# Patient Record
Sex: Female | Born: 1995 | Race: Black or African American | Hispanic: No | Smoking: Never smoker
Health system: Southern US, Community
[De-identification: ages and names within clinical notes are randomized; demographics above are authoritative.]

## PROBLEM LIST (undated history)

## (undated) ENCOUNTER — Inpatient Hospital Stay (HOSPITAL_COMMUNITY): Payer: Self-pay

## (undated) DIAGNOSIS — Z9889 Other specified postprocedural states: Secondary | ICD-10-CM

## (undated) DIAGNOSIS — O3430 Maternal care for cervical incompetence, unspecified trimester: Secondary | ICD-10-CM

---
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
## 2013-01-11 NOTE — L&D Delivery Note (Signed)
Delivery Note  Patient is 18 y.o. 301P1 3592w1d with no prenatal care in SOL with bulging bag in vagina, SROM'd when transferred to birthing suites and FHT found to be 55 on sono which was consistent with LMP.   At  a non-viable unspecified sex was delivered via  (Presentation: breech ).  APGAR: 0,0  Placenta status: pending .  Anesthesia:  none Episiotomy: none Lacerations: none Suture Repair: n/a Est. Blood Loss (mL): 150mL as of 7:48 PM  Placenta pending, 1000mcg of cytotec given orally, pitocin started.  Margaret Keller to postpartum.  [facility].  Margaret Keller 10/23/2013, 7:47 PM   Placenta delivered at 2023, total EBL 200cc, placenta sent to path.  Patient currently very comfortable.  Perry MountACOSTA,Margaret Yilmaz ROCIO, MD 2:33 AM

## 2013-10-23 ENCOUNTER — Inpatient Hospital Stay (HOSPITAL_COMMUNITY): Payer: Medicaid Other

## 2013-10-23 ENCOUNTER — Encounter (HOSPITAL_COMMUNITY): Payer: Self-pay | Admitting: *Deleted

## 2013-10-23 ENCOUNTER — Inpatient Hospital Stay (HOSPITAL_COMMUNITY)
Admission: AD | Admit: 2013-10-23 | Discharge: 2013-10-24 | DRG: 775 | Disposition: A | Discharge status: Home / Self Care | Payer: Medicaid Other | Source: Ambulatory Visit | Attending: Family Medicine | Admitting: Family Medicine

## 2013-10-23 DIAGNOSIS — O42912 Preterm premature rupture of membranes, unspecified as to length of time between rupture and onset of labor, second trimester: Principal | ICD-10-CM | POA: Diagnosis present

## 2013-10-23 DIAGNOSIS — Z3A20 20 weeks gestation of pregnancy: Secondary | ICD-10-CM | POA: Diagnosis present

## 2013-10-23 DIAGNOSIS — O479 False labor, unspecified: Secondary | ICD-10-CM | POA: Diagnosis present

## 2013-10-23 DIAGNOSIS — O0932 Supervision of pregnancy with insufficient antenatal care, second trimester: Secondary | ICD-10-CM

## 2013-10-23 DIAGNOSIS — O42919 Preterm premature rupture of membranes, unspecified as to length of time between rupture and onset of labor, unspecified trimester: Secondary | ICD-10-CM

## 2013-10-23 DIAGNOSIS — O321XX0 Maternal care for breech presentation, not applicable or unspecified: Secondary | ICD-10-CM | POA: Diagnosis present

## 2013-10-23 DIAGNOSIS — O4700 False labor before 37 completed weeks of gestation, unspecified trimester: Secondary | ICD-10-CM | POA: Diagnosis present

## 2013-10-23 LAB — DIFFERENTIAL
BASOS PCT: 0 % (ref 0–1)
Basophils Absolute: 0 10*3/uL (ref 0.0–0.1)
EOS PCT: 1 % (ref 0–5)
Eosinophils Absolute: 0.2 10*3/uL (ref 0.0–0.7)
LYMPHS ABS: 5.4 10*3/uL — AB (ref 0.7–4.0)
Lymphocytes Relative: 27 % (ref 12–46)
MONO ABS: 1.6 10*3/uL — AB (ref 0.1–1.0)
Monocytes Relative: 8 % (ref 3–12)
NEUTROS ABS: 12.7 10*3/uL — AB (ref 1.7–7.7)
Neutrophils Relative %: 64 % (ref 43–77)

## 2013-10-23 LAB — TYPE AND SCREEN
ABO/RH(D): O POS
Antibody Screen: NEGATIVE

## 2013-10-23 LAB — CBC
HEMATOCRIT: 35 % — AB (ref 36.0–46.0)
Hemoglobin: 12.4 g/dL (ref 12.0–15.0)
MCH: 29.9 pg (ref 26.0–34.0)
MCHC: 35.4 g/dL (ref 30.0–36.0)
MCV: 84.3 fL (ref 78.0–100.0)
Platelets: 143 10*3/uL — ABNORMAL LOW (ref 150–400)
RBC: 4.15 MIL/uL (ref 3.87–5.11)
RDW: 13.5 % (ref 11.5–15.5)
WBC: 19.9 10*3/uL — AB (ref 4.0–10.5)

## 2013-10-23 LAB — ABO/RH: ABO/RH(D): O POS

## 2013-10-23 LAB — RAPID HIV SCREEN (WH-MAU): Rapid HIV Screen: NONREACTIVE

## 2013-10-23 MED ORDER — WITCH HAZEL-GLYCERIN EX PADS: 1.0000 "application " | MEDICATED_PAD | CUTANEOUS | Status: DC | PRN

## 2013-10-23 MED ORDER — SENNOSIDES-DOCUSATE SODIUM 8.6-50 MG PO TABS: 2.0000 | ORAL_TABLET | ORAL | Status: DC

## 2013-10-23 MED ORDER — CITRIC ACID-SODIUM CITRATE 334-500 MG/5ML PO SOLN: 30.0000 mL | ORAL | Status: DC | PRN

## 2013-10-23 MED ORDER — OXYCODONE-ACETAMINOPHEN 5-325 MG PO TABS: 2.0000 | ORAL_TABLET | ORAL | Status: DC | PRN

## 2013-10-23 MED ORDER — LACTATED RINGERS IV SOLN
INTRAVENOUS | Status: DC
Start: 2013-10-23 — End: 2013-10-23

## 2013-10-23 MED ORDER — ONDANSETRON HCL 4 MG/2ML IJ SOLN: 4.0000 mg | INTRAMUSCULAR | Status: DC | PRN

## 2013-10-23 MED ORDER — PRENATAL MULTIVITAMIN CH: 1.0000 | ORAL_TABLET | Freq: Every day | ORAL | Status: DC

## 2013-10-23 MED ORDER — ACETAMINOPHEN 325 MG PO TABS: 650.0000 mg | ORAL_TABLET | ORAL | Status: DC | PRN

## 2013-10-23 MED ORDER — LANOLIN HYDROUS EX OINT: TOPICAL_OINTMENT | CUTANEOUS | Status: DC | PRN

## 2013-10-23 MED ORDER — SIMETHICONE 80 MG PO CHEW: 80.0000 mg | CHEWABLE_TABLET | ORAL | Status: DC | PRN

## 2013-10-23 MED ORDER — OXYCODONE-ACETAMINOPHEN 5-325 MG PO TABS: 1.0000 | ORAL_TABLET | ORAL | Status: DC | PRN

## 2013-10-23 MED ORDER — ZOLPIDEM TARTRATE 5 MG PO TABS: 5.0000 mg | ORAL_TABLET | Freq: Every evening | ORAL | Status: DC | PRN

## 2013-10-23 MED ORDER — TETANUS-DIPHTH-ACELL PERTUSSIS 5-2.5-18.5 LF-MCG/0.5 IM SUSP: 0.5000 mL | Freq: Once | INTRAMUSCULAR | Status: DC

## 2013-10-23 MED ORDER — OXYTOCIN BOLUS FROM INFUSION
500.0000 mL | INTRAVENOUS | Status: DC
  Administered 2013-10-23: 500 mL via INTRAVENOUS

## 2013-10-23 MED ORDER — CALCIUM CARBONATE ANTACID 500 MG PO CHEW: 2.0000 | CHEWABLE_TABLET | ORAL | Status: DC | PRN

## 2013-10-23 MED ORDER — INFLUENZA VAC SPLIT QUAD 0.5 ML IM SUSY
0.5000 mL | PREFILLED_SYRINGE | INTRAMUSCULAR | Status: AC
  Administered 2013-10-24: 0.5 mL via INTRAMUSCULAR

## 2013-10-23 MED ORDER — FLEET ENEMA 7-19 GM/118ML RE ENEM: 1.0000 | ENEMA | RECTAL | Status: DC | PRN

## 2013-10-23 MED ORDER — BENZOCAINE-MENTHOL 20-0.5 % EX AERO: 1.0000 "application " | INHALATION_SPRAY | CUTANEOUS | Status: DC | PRN

## 2013-10-23 MED ORDER — DIBUCAINE 1 % RE OINT: 1.0000 "application " | TOPICAL_OINTMENT | RECTAL | Status: DC | PRN

## 2013-10-23 MED ORDER — LIDOCAINE HCL (PF) 1 % IJ SOLN
30.0000 mL | INTRAMUSCULAR | Status: DC | PRN
  Filled 2013-10-23: qty 30

## 2013-10-23 MED ORDER — DOCUSATE SODIUM 100 MG PO CAPS: 100.0000 mg | ORAL_CAPSULE | Freq: Every day | ORAL | Status: DC

## 2013-10-23 MED ORDER — ONDANSETRON HCL 4 MG/2ML IJ SOLN: 4.0000 mg | Freq: Four times a day (QID) | INTRAMUSCULAR | Status: DC | PRN

## 2013-10-23 MED ORDER — MISOPROSTOL 200 MCG PO TABS
1000.0000 ug | ORAL_TABLET | Freq: Once | ORAL | Status: AC
  Administered 2013-10-23: 1000 ug via ORAL
  Filled 2013-10-23: qty 5

## 2013-10-23 MED ORDER — OXYTOCIN 40 UNITS IN LACTATED RINGERS INFUSION - SIMPLE MED
62.5000 mL/h | INTRAVENOUS | Status: DC
  Filled 2013-10-23: qty 1000

## 2013-10-23 MED ORDER — DIPHENHYDRAMINE HCL 25 MG PO CAPS: 25.0000 mg | ORAL_CAPSULE | Freq: Four times a day (QID) | ORAL | Status: DC | PRN

## 2013-10-23 MED ORDER — ONDANSETRON HCL 4 MG PO TABS
4.0000 mg | ORAL_TABLET | ORAL | Status: DC | PRN
Start: 2013-10-23 — End: 2013-10-24

## 2013-10-23 MED ORDER — LACTATED RINGERS IV SOLN: 500.0000 mL | INTRAVENOUS | Status: DC | PRN

## 2013-10-23 MED ORDER — FENTANYL CITRATE 0.05 MG/ML IJ SOLN
100.0000 ug | INTRAMUSCULAR | Status: DC | PRN
  Administered 2013-10-23: 100 ug via INTRAVENOUS
  Filled 2013-10-23: qty 2

## 2013-10-23 MED ORDER — IBUPROFEN 600 MG PO TABS
600.0000 mg | ORAL_TABLET | Freq: Four times a day (QID) | ORAL | Status: DC
  Administered 2013-10-23 – 2013-10-24 (×2): 600 mg via ORAL
  Filled 2013-10-23 (×2): qty 1

## 2013-10-23 NOTE — MAU Note (Signed)
Pt C/O lower abd pain that started yesterday, became severe today.  Pt noted slight amount of bleeding with wiping on arrival to MAU.  No PNC.

## 2013-10-23 NOTE — MAU Note (Signed)
Report called to Earle GellBobby Gurkirat [facility].

## 2013-10-23 NOTE — H&P (Signed)
FACULTY PRACTICE ANTEPARTUM ADMISSION HISTORY AND PHYSICAL NOTE   History of Present Illness: Margaret Keller is a 18 y.o. G1P0 at 1943w1d admitted for Preterm labor. Patient reports the fetal movement as active. Patient reports uterine contraction  activity as regular, every 2-3 minutes. Patient reports  vaginal bleeding as none. Patient describes fluid per vagina as None. Fetal presentation is unsure.  Interview limited by patient's severe distress, reporting "I have to push, I have to push."  She also reports having taken a tylenol-PM today for pain.  Thus intermittently patient appears lethargic.   When arrived to L&D patient reported she had to push and I witnessed clear nonfoul smelling rupture of membranes.  On initial spec exam membranes in vault and intact.  Patient Active Problem List   Diagnosis Date Noted  . Preterm labor 10/23/2013     Past Medical History  Diagnosis Date  . Medical history non-contributory      Past Surgical History  Procedure Laterality Date  . No past surgeries       OB History   Grav Para Term Preterm Abortions TAB SAB Ect Mult Living   1               History   Social History  . Marital Status: Single    Spouse Name: N/A    Number of Children: N/A  . Years of Education: N/A   Social History Main Topics  . Smoking status: Never Smoker   . Smokeless tobacco: None  . Alcohol Use: No  . Drug Use: No  . Sexual Activity: Yes   Other Topics Concern  . None   Social History Narrative  . None    History reviewed. No pertinent family history.  Allergies not on file  No prescriptions prior to admission    . docusate sodium  100 mg Oral Daily  . [START ON 10/24/2013] prenatal multivitamin  1 tablet Oral Q1200   I have reviewed the patient's current medications.  Review of Systems - General ROS: negative ROS severely limited secondary to patient's severe distress, appears drowsy  Vitals:  BP 123/66  Pulse 92  Temp(Src)  97.9 F (36.6 C) (Oral)  Resp 22  LMP 06/04/2013 Physical Examination:  General appearance - intermittently lethargic, in severe distress Abdomen - soft, nontender, nondistended, no masses or organomegaly Pelvic - normal external genitalia, vulva, vagina, cervix, uterus and adnexa Extremities - peripheral pulses normal, no pedal edema, no clubbing or cyanosis Skin - normal coloration and turgor, no rashes, no suspicious skin lesions noted Abdomen: gravid Pelvic Exam:normal external genitalia, vulva, vagina, cervix, uterus and adnexa, bulging membranes easily visualized in vagina, exam limited due to severe distress of patient  Cervix: Not evaluated. Extremities: extremities normal, atraumatic, no cyanosis or edema  Membranes:intact Fetal Monitoring TOCO: q1-2 Labs:  No results found for this or any previous visit (from the past 24 hour(s)).  Imaging Studies: No results found.   Assessment and Plan: Patient Active Problem List   Diagnosis Date Noted  . Preterm labor 10/23/2013    Dating ultrasound STAT=> c/w LMP 5443w1d, FHT on sono 55 Patient reporting that she is very sure of her dating, discussed that fetus is previable based on LMP==> as such no betamethasone, mag given.   No tocolysis secondary to rupture of membranes.  Prenatal labs including CBC, type and screen UDS G/C  Repeat FHT in 2-3 hours  Perry MountACOSTA,Socrates Cahoon ROCIO, MD OB fellow Faculty Practice, [facility]

## 2013-10-24 LAB — RPR

## 2013-10-24 LAB — RUBELLA SCREEN: RUBELLA: 2.37 {index} — AB (ref <0.90)

## 2013-10-24 LAB — RAPID URINE DRUG SCREEN, HOSP PERFORMED
AMPHETAMINES: NOT DETECTED
Barbiturates: NOT DETECTED
Benzodiazepines: NOT DETECTED
COCAINE: NOT DETECTED
OPIATES: NOT DETECTED
TETRAHYDROCANNABINOL: NOT DETECTED

## 2013-10-24 LAB — HEPATITIS B SURFACE ANTIGEN: HEP B S AG: NEGATIVE

## 2013-10-24 MED ORDER — NORETHINDRONE 0.35 MG PO TABS: 1.0000 | ORAL_TABLET | Freq: Every day | ORAL | Status: DC

## 2013-10-24 NOTE — Discharge Summary (Signed)
Attestation of Attending Supervision of Advanced Practitioner (CNM/NP): Evaluation and management procedures were performed by the Advanced Practitioner under my supervision and collaboration.  I have reviewed the Advanced Practitioner's note and chart, and I agree with the management and plan.  Torryn Fiske 10/24/2013 8:46 AM   

## 2013-10-24 NOTE — Discharge Summary (Signed)
Obstetric Discharge Summary Reason for Admission: onset of labor Prenatal Procedures: ultrasound Intrapartum Procedures: spontaneous vaginal delivery Postpartum Procedures: none Complications-Operative and Postpartum: none  Delivery Note Patient is 18 y.o. 61P1 5175w1d with no prenatal care in SOL with bulging bag in vagina, SROM'd when transferred to birthing suites and FHT found to be 55 on sono which was consistent with LMP.  At a non-viable unspecified sex was delivered via (Presentation: breech ). APGAR: 0,0  Placenta status: pending .  Anesthesia: none  Episiotomy: none  Lacerations: none  Suture Repair: n/a  Est. Blood Loss (mL): 150mL as of 7:48 PM  Placenta pending, 1000mcg of cytotec given orally, pitocin started.  Mom to postpartum. [facility].  Margaret Keller  10/23/2013, 7:47 PM  Placenta delivered at 2023, total EBL 200cc, placenta sent to path. Patient currently very comfortable.      Hospital Course:  Active Problems:   Preterm labor   Preterm contractions   Preterm premature rupture of membranes (PPROM) with unknown onset of labor   Today: No acute events overnight.  Pt denies problems with ambulating, voiding or po intake.  She denies nausea or vomiting.  Pain is well controlled.  She has had flatus. She has had bowel movement.  Lochia Minimal.  Plan for birth control is  OCP.  Margaret Keller is a 18 y.o. G1P1 s/p SVD at 7175w1d after presenting to MAU in active labor with bulging membranes in vagina.  She did not receive any prenatal care, ultrasound consistent with LMP with FHT 55, ruptured spontaneously when transferred to birthing suites  H/H: Lab Results  Component Value Date/Time   HGB 12.4 10/23/2013  5:16 PM   HCT 35.0* 10/23/2013  5:16 PM    Discharge Diagnoses: Premature labor  Discharge Information: Date: 10/24/2013 Activity: pelvic rest Diet: routine  Medications: micronor Breast feeding:  No: n/a Condition:  stable Instructions: refer to handout Discharge to: home   Discharge Instructions   Call MD for:  severe uncontrolled pain    Complete by:  As directed      Call MD for:  temperature >100.4    Complete by:  As directed      Diet - low sodium heart healthy    Complete by:  As directed             Medication List         acetaminophen 500 MG tablet  Commonly known as:  TYLENOL  Take 1,000 mg by mouth every 6 (six) hours as needed for mild pain or headache.     norethindrone 0.35 MG tablet  Commonly known as:  ORTHO MICRONOR  Take 1 tablet (0.35 mg total) by mouth daily.           Follow-up Information   Follow up with [facility] In 5 weeks.   Contact information:   [address] [place] [address] [phone]       Schedule an appointment as soon as possible for a visit to follow up. (for postpartum follow up)       Perry MountACOSTA,Margaret Figge Keller ,MD OB Fellow 10/24/2013,7:53 AM

## 2013-10-24 NOTE — Progress Notes (Signed)
Discharge teaching complete. Pt understood all instructions and did not have any questions. Pt ambulated out of the hospital and discharged home to family.  

## 2013-10-24 NOTE — Discharge Instructions (Signed)
Vaginal Delivery, Care After °Refer to this sheet in the next few weeks. These instructions provide you with information on caring for yourself after your delivery. Your health care provider may also give you more specific instructions. Your treatment has been planned according to current medical practices, but problems sometimes occur. Call your health care provider if you have any problems or questions after your delivery. °WHAT TO EXPECT AFTER YOUR DELIVERY °After your delivery, it is typical to have the following:  °· You may feel pain in the vaginal area for several days after delivery. If you had an incision or a vaginal tear, the area will probably continue to be tender to the touch for several weeks. °· You may feel very fatigued after a vaginal delivery. °· You may have vaginal bleeding and discharge that will start out red, then become pink, then yellow, then white. Altogether, this usually lasts for about 6 weeks. °· The combination of having lost your baby and changing hormones from the delivery can make you feel very sad. You may also experience emotions that change very quickly. Some of the emotions people often notice after loss include: °¨ Anger. °¨ Denial. °¨ Guilt. °¨ Sorrow. °¨ Depression. °¨ Grief. °¨ Relationship problems. °HOME CARE INSTRUCTIONS  °· Consider seeking support for your loss. Some forms of support that you might consider include your religious leader, friends, family, a professional counselor, or a bereavement support group. °· Take medicines only as directed by your health care provider. °· Continue to use good perineal care. Good perineal care includes:   °¨ Wiping your perineum from front to back.   °¨ Keeping your perineum clean.   °· Do not use tampons or douche until your health care provider says it is okay.   °· Shower, wash your hair, and take tub baths as directed by your health care provider.   °· Wear a well-fitting bra that provides breast support.   °· Drink enough  fluids to keep your urine clear or pale yellow.   °· Eat healthy foods.   °· Eat high-fiber foods every day, such as whole grain cereals and breads, brown rice, beans, and fresh fruits and vegetables. These foods may help prevent or relieve constipation.   °· Follow your health care provider's directions about resuming activities such as climbing stairs, driving, lifting, exercising, or traveling.   °· Increase your activities gradually.   °· Talk to your health care provider about resuming sexual activities. This depends on your risk of infection, your rate of healing, and your comfort and desire to resume sexual activity.   °· Try to have someone help you with your household activities for at least a few days after you leave the hospital.   °· Rest as much as possible. °· Keep all of your scheduled postpartum appointments. It is very important to keep your scheduled follow-up appointments. At these appointments, your health care provider will be checking to make sure that you are healing physically and emotionally.   °· Do not drink alcohol, especially if you are taking medicine to relieve pain.   °· Do not use any tobacco products including cigarettes, chewing tobacco, or electronic cigarettes. If you need help quitting, ask your health care provider. °· Do not use illegal drugs.   °SEEK MEDICAL CARE IF:  °· You feel sad or depressed.   °· You have thoughts of hurting yourself. °· You are having trouble eating or sleeping. °· You cannot enjoy the things in life you have previously enjoyed. °· You are passing large clots from your vagina. Save any clots to   show your health care provider.   °· You have a bad smelling discharge from your vagina.   °· You have trouble urinating.   °· You are urinating frequently.   °· You have pain when you urinate.   °· You have a change in your bowel movements.   °· You have increasing redness, pain, or swelling near your incision or vaginal tear.   °· You have pus draining from  your incision or vaginal tear.   °· Your incision or vaginal tear is separating.   °· You have painful, hard, or reddened breasts.   °· You have a severe headache.   °· You have blurred vision or see spots.   °· You are dizzy or light-headed.   °· You have a rash.   °· You have nausea or vomiting.   °· You have not had a menstrual period by the 12th week after delivery.   °· You have a fever.   °SEEK IMMEDIATE MEDICAL CARE IF:  °· You are concerned that you may hurt yourself or you are considering suicide.   °· You have persistent pain.   °· You have chest pain.   °· You have shortness of breath.   °· You faint.   °· You have leg pain.   °· You have stomach pain.   °· Your vaginal bleeding saturates two or more sanitary pads in 1 hour.   °MAKE SURE YOU: °· Understand these instructions.   °· Will watch your condition.   °· Will get help right away if you are not doing well or get worse.   °Document Released: 05/14/2013 Document Reviewed: 01/02/2013 °ExitCare® Patient Information ©2015 [employer]. This information is not intended to replace advice given to you by your health care provider. Make sure you discuss any questions you have with your health care provider. ° °

## 2013-10-24 NOTE — H&P (Signed)
Attestation of Attending Supervision of Advanced Practitioner (CNM/NP): Evaluation and management procedures were performed by the Advanced Practitioner under my supervision and collaboration.  I have reviewed the Advanced Practitioner's note and chart, and I agree with the management and plan.  Makylie Rivere 10/24/2013 1:37 AM

## 2013-10-25 LAB — GC/CHLAMYDIA PROBE AMP
CT Probe RNA: NEGATIVE
GC Probe RNA: NEGATIVE

## 2013-11-12 ENCOUNTER — Encounter (HOSPITAL_COMMUNITY): Payer: Self-pay | Admitting: *Deleted

## 2014-01-11 NOTE — L&D Delivery Note (Signed)
19yo G2P0100 at 656w0d admitted for PTL and incompetent cervix.    Delivery Note At 4:32 PM a non-viable female was delivered via Vaginal, Spontaneous Delivery (Presentation: footling breech).  APGAR: 1, 1; weight pending.   Placenta status: Intact, Spontaneous - sent to Pathology.  Cord: 3 vc.  Complications: none.    Anesthesia: None  Episiotomy: None Lacerations: None Est. Blood Loss (mL): 318  Mom to postpartum.  [facility].  STINSON, JACOB JEHIEL 05/31/2014, 5:06 PM

## 2014-02-08 ENCOUNTER — Inpatient Hospital Stay (HOSPITAL_COMMUNITY)
Admission: AD | Admit: 2014-02-08 | Discharge: 2014-02-08 | Discharge status: Against Medical Advice | Payer: Medicaid Other | Source: Ambulatory Visit | Attending: Obstetrics and Gynecology | Admitting: Obstetrics and Gynecology

## 2014-02-08 ENCOUNTER — Encounter (HOSPITAL_COMMUNITY): Payer: Self-pay | Admitting: *Deleted

## 2014-02-08 DIAGNOSIS — Z3201 Encounter for pregnancy test, result positive: Secondary | ICD-10-CM | POA: Insufficient documentation

## 2014-02-08 DIAGNOSIS — Z3200 Encounter for pregnancy test, result unknown: Secondary | ICD-10-CM | POA: Diagnosis present

## 2014-02-08 DIAGNOSIS — Z5320 Procedure and treatment not carried out because of patient's decision for unspecified reasons: Secondary | ICD-10-CM | POA: Diagnosis not present

## 2014-02-08 LAB — POCT PREGNANCY, URINE: PREG TEST UR: POSITIVE — AB

## 2014-02-08 NOTE — MAU Note (Signed)
Not in lobby

## 2014-02-08 NOTE — MAU Note (Signed)
Need to see if  She is pregnant, +HPT 3 days ago. No problems- just wants confirmation.

## 2014-02-08 NOTE — MAU Note (Signed)
Not in lobby #3 

## 2014-02-08 NOTE — MAU Provider Note (Signed)
Called pt from lobby to report pregnancy test results.  Pt not in lobby.  Sharen CounterLisa Leftwich-Kirby Certified Nurse-Midwife

## 2014-05-31 ENCOUNTER — Inpatient Hospital Stay (HOSPITAL_COMMUNITY): Payer: Medicaid Other

## 2014-05-31 ENCOUNTER — Encounter (HOSPITAL_COMMUNITY): Payer: Self-pay

## 2014-05-31 ENCOUNTER — Inpatient Hospital Stay (HOSPITAL_COMMUNITY)
Admission: AD | Admit: 2014-05-31 | Discharge: 2014-06-01 | DRG: 775 | Disposition: A | Discharge status: Home / Self Care | Payer: Medicaid Other | Source: Ambulatory Visit | Attending: Family Medicine | Admitting: Family Medicine

## 2014-05-31 DIAGNOSIS — Z3A22 22 weeks gestation of pregnancy: Secondary | ICD-10-CM | POA: Diagnosis present

## 2014-05-31 DIAGNOSIS — O3432 Maternal care for cervical incompetence, second trimester: Secondary | ICD-10-CM | POA: Diagnosis present

## 2014-05-31 DIAGNOSIS — O364XX0 Maternal care for intrauterine death, not applicable or unspecified: Secondary | ICD-10-CM | POA: Diagnosis present

## 2014-05-31 DIAGNOSIS — O0932 Supervision of pregnancy with insufficient antenatal care, second trimester: Secondary | ICD-10-CM

## 2014-05-31 LAB — RAPID HIV SCREEN (HIV 1/2 AB+AG)
HIV 1/2 Antibodies: NONREACTIVE
HIV-1 P24 Antigen - HIV24: NONREACTIVE

## 2014-05-31 LAB — TYPE AND SCREEN
ABO/RH(D): O POS
ANTIBODY SCREEN: NEGATIVE

## 2014-05-31 LAB — CBC
HEMATOCRIT: 35.8 % — AB (ref 36.0–46.0)
HEMOGLOBIN: 12.8 g/dL (ref 12.0–15.0)
MCH: 30.2 pg (ref 26.0–34.0)
MCHC: 35.8 g/dL (ref 30.0–36.0)
MCV: 84.4 fL (ref 78.0–100.0)
Platelets: 126 10*3/uL — ABNORMAL LOW (ref 150–400)
RBC: 4.24 MIL/uL (ref 3.87–5.11)
RDW: 13.8 % (ref 11.5–15.5)
WBC: 17.8 10*3/uL — ABNORMAL HIGH (ref 4.0–10.5)

## 2014-05-31 LAB — RAPID URINE DRUG SCREEN, HOSP PERFORMED
Amphetamines: NOT DETECTED
BARBITURATES: NOT DETECTED
Benzodiazepines: NOT DETECTED
COCAINE: NOT DETECTED
OPIATES: NOT DETECTED
Tetrahydrocannabinol: NOT DETECTED

## 2014-05-31 LAB — WET PREP, GENITAL
Clue Cells Wet Prep HPF POC: NONE SEEN
Trich, Wet Prep: NONE SEEN
Yeast Wet Prep HPF POC: NONE SEEN

## 2014-05-31 LAB — GROUP B STREP BY PCR: Group B strep by PCR: POSITIVE — AB

## 2014-05-31 LAB — DIFFERENTIAL
Basophils Absolute: 0 10*3/uL (ref 0.0–0.1)
Basophils Relative: 0 % (ref 0–1)
EOS PCT: 0 % (ref 0–5)
Eosinophils Absolute: 0 10*3/uL (ref 0.0–0.7)
Lymphocytes Relative: 10 % — ABNORMAL LOW (ref 12–46)
Lymphs Abs: 1.7 10*3/uL (ref 0.7–4.0)
Monocytes Absolute: 0.8 10*3/uL (ref 0.1–1.0)
Monocytes Relative: 5 % (ref 3–12)
NEUTROS PCT: 85 % — AB (ref 43–77)
Neutro Abs: 15.2 10*3/uL — ABNORMAL HIGH (ref 1.7–7.7)

## 2014-05-31 LAB — HEPATITIS B SURFACE ANTIGEN: Hepatitis B Surface Ag: NEGATIVE

## 2014-05-31 LAB — OB RESULTS CONSOLE HIV ANTIBODY (ROUTINE TESTING): HIV: NONREACTIVE

## 2014-05-31 LAB — OB RESULTS CONSOLE GBS: GBS: POSITIVE

## 2014-05-31 MED ORDER — ACETAMINOPHEN 325 MG PO TABS: 650.0000 mg | ORAL_TABLET | ORAL | Status: DC | PRN

## 2014-05-31 MED ORDER — OXYTOCIN BOLUS FROM INFUSION
500.0000 mL | INTRAVENOUS | Status: DC
  Administered 2014-05-31: 500 mL via INTRAVENOUS

## 2014-05-31 MED ORDER — CITRIC ACID-SODIUM CITRATE 334-500 MG/5ML PO SOLN: 30.0000 mL | ORAL | Status: DC | PRN

## 2014-05-31 MED ORDER — IBUPROFEN 600 MG PO TABS
600.0000 mg | ORAL_TABLET | Freq: Four times a day (QID) | ORAL | Status: DC
  Administered 2014-05-31: 600 mg via ORAL
  Filled 2014-05-31: qty 1

## 2014-05-31 MED ORDER — MAGNESIUM SULFATE 50 % IJ SOLN
2.0000 g/h | INTRAVENOUS | Status: DC
  Filled 2014-05-31: qty 80

## 2014-05-31 MED ORDER — OXYTOCIN 40 UNITS IN LACTATED RINGERS INFUSION - SIMPLE MED
62.5000 mL/h | INTRAVENOUS | Status: DC
  Filled 2014-05-31: qty 1000

## 2014-05-31 MED ORDER — ONDANSETRON HCL 4 MG/2ML IJ SOLN: 4.0000 mg | Freq: Four times a day (QID) | INTRAMUSCULAR | Status: DC | PRN

## 2014-05-31 MED ORDER — LACTATED RINGERS IV SOLN
INTRAVENOUS | Status: DC
  Administered 2014-05-31: 10:00:00 via INTRAVENOUS

## 2014-05-31 MED ORDER — LACTATED RINGERS IV SOLN: 500.0000 mL | INTRAVENOUS | Status: DC | PRN

## 2014-05-31 MED ORDER — LIDOCAINE HCL (PF) 1 % IJ SOLN: 30.0000 mL | INTRAMUSCULAR | Status: DC | PRN

## 2014-05-31 MED ORDER — MAGNESIUM SULFATE BOLUS VIA INFUSION
6.0000 g | Freq: Once | INTRAVENOUS | Status: AC
Start: 2014-05-31 — End: 2014-05-31
  Administered 2014-05-31: 6 g via INTRAVENOUS
  Filled 2014-05-31: qty 500

## 2014-05-31 MED ORDER — TERBUTALINE SULFATE 1 MG/ML IJ SOLN
0.2500 mg | Freq: Once | INTRAMUSCULAR | Status: AC
  Administered 2014-05-31: 0.25 mg via SUBCUTANEOUS
  Filled 2014-05-31: qty 1

## 2014-05-31 MED ORDER — OXYCODONE-ACETAMINOPHEN 5-325 MG PO TABS: 1.0000 | ORAL_TABLET | ORAL | Status: DC | PRN

## 2014-05-31 MED ORDER — OXYCODONE-ACETAMINOPHEN 5-325 MG PO TABS: 2.0000 | ORAL_TABLET | ORAL | Status: DC | PRN

## 2014-05-31 MED ORDER — BETAMETHASONE SOD PHOS & ACET 6 (3-3) MG/ML IJ SUSP
12.0000 mg | Freq: Once | INTRAMUSCULAR | Status: AC
  Administered 2014-05-31: 12 mg via INTRAMUSCULAR
  Filled 2014-05-31: qty 2

## 2014-05-31 MED ORDER — FENTANYL CITRATE (PF) 100 MCG/2ML IJ SOLN
50.0000 ug | INTRAMUSCULAR | Status: DC | PRN
  Administered 2014-05-31 (×3): 100 ug via INTRAVENOUS
  Filled 2014-05-31 (×3): qty 2

## 2014-05-31 NOTE — Progress Notes (Signed)
Lytle MichaelsShadae T Macgregor is a 19 y.o. G2P0100 at 5557w0d by LMP admitted for Preterm labor  Subjective: Still very uncomfortable.  Has received a couple doses of fentanyl.    Objective: BP 132/71 mmHg  Pulse 104  Temp(Src) 97.9 F (36.6 C) (Oral)  Resp 20  Ht 5\' 5"  (1.651 m)  Wt 132 lb (59.875 kg)  BMI 21.97 kg/m2  SpO2 99%  LMP 12/28/2013   Total I/O In: 1661.3 [P.O.:240; I.V.:1421.3] Out: 1200 [Urine:1200]  FHT: Dopplers: 152  UC:   regular, every 5 minutes SVE:   Dilation: 10 Effacement (%): 100  Labs: Lab Results  Component Value Date   WBC 17.8* 05/31/2014   HGB 12.8 05/31/2014   HCT 35.8* 05/31/2014   MCV 84.4 05/31/2014   PLT 126* 05/31/2014    Assessment / Plan: Cervical exam done cautiously - no discernable cervix palpated.  Buldging back palpated in vagina with fetal parts - more than the foot seen on US.  I discussed with patient that despite magnesium, continuing to contract.  Delivery is considered inevitable at this point.  Recommended stopping magnesium.  Patient agreed with above course of management.   Anticipated MOD:  NSVD  STINSON, JACOB JEHIEL 05/31/2014, 2:55 PM

## 2014-05-31 NOTE — MAU Note (Signed)
Pt presents complaining of lower abdominal cramping and vaginal bleeding that started at 0600. Hx of delivering at 20w in October 2015. Last intercourse yesterday.

## 2014-05-31 NOTE — MAU Note (Signed)
Urine in lab 

## 2014-05-31 NOTE — Progress Notes (Signed)
OBIX tracing in wrong chart

## 2014-05-31 NOTE — Progress Notes (Signed)
I spent time with Margaret Keller and her SO while they were receiving initial updates from neonatology about their baby.  I offered emotional support and pastoral presence during these initial stages of labor and processing.  I will refer to weekend chaplain for continued support.  [employer] CorporationChaplain Katy Lakisa Lotz Pager, [phone] 3:21 PM    05/31/14 1500  Clinical Encounter Type  Visited With Patient  Visit Type Spiritual support  Referral From Nurse  Spiritual Encounters  Spiritual Needs Grief support;Emotional  Stress Factors  Patient Stress Factors Loss

## 2014-05-31 NOTE — Consult Note (Signed)
Asked by Dr.Stinson to provide prenatal consultation for 19 y.o G2 P0 mother who was admitted this morning in preterm labor with bulging BOW.  She had not begun prenatal care so exact EGA is uncertain, but LMP indicates 22 0/7wks and [place] today showed 22 4/7 wks.  Her previous pregnancy ended in delivery of a previable baby at 20 wks last year. She was given a dose of betamethasone and has been started on tocolysis but continues with contractions..  I met with patient and FOB and explained that neonatal resuscitation and intervention would be essentially futile before [redacted] wks EGA.  Told them intervention would be an option if she reached 23 wks and that Rx with BMZ would improve prognosis.  They expressed understanding and had no questions at this time.  Hospital chaplain Franco Collet was present during discussion and remained with them afterwards to offer emotional support.  Thank you for the consultation.  Total time 30 minutes

## 2014-05-31 NOTE — Progress Notes (Addendum)
    [facility]           I was able to clarify with lab what Margaret true result of this test was. I spoke with Margaret Keller and Margaret Keller.   Apparently Margaret original actual result is that this test is NEGATIVE for   P24/HIV 4th generation  RAPID test  Margaret technician had incorrectly entered in Margaret + "control" strip into Margaret data base which was Margaret #4  [name]  I HAVE ALREADY ORDERED A CONVENTIONAL NON RAPID 4TH GENERATION AB TEST AND HIV RNA WHICH I WOULD RECOMMEND RUNNING REGARDLESS BUT AGAIN TO CLARIFY Margaret HIV RAPID TEST WAS NONREACTIVE

## 2014-05-31 NOTE — Progress Notes (Signed)
Patient ID: Margaret Keller, female   DOB: 06/11/1995, 19 y.o.   MRN: [id number] Rapid HIV abnormal, but NR. HIV antibody and RNA drawn per Dr. Daiva EvesVan Dam, ID.    Margaret Keller, CNM 05/31/2014 4:26 PM

## 2014-05-31 NOTE — H&P (Signed)
Margaret Keller is a 19 y.o. female 662P0100 @ 22wks by LMP presenting for eval of vag bldg and cramping that began @ 0600. Her hx is significant for a preterm del @ 20wks in 10/2013. Denies N/V/D, fever, or dysuria.  History OB History    Gravida Para Term Preterm AB TAB SAB Ectopic Multiple Living   2 1             Past Medical History  Diagnosis Date  . Medical history non-contributory    Past Surgical History  Procedure Laterality Date  . No past surgeries     Family History: family history is not on file. Social History:  reports that she has never smoked. She does not have any smokeless tobacco history on file. She reports that she does not drink alcohol or use illicit drugs.   Prenatal Transfer Tool  Maternal Diabetes: No Genetic Screening: Declined Maternal Ultrasounds/Referrals: Abnormal:  Findings:   Other:BBOW@ 22.4wks Fetal Ultrasounds or other Referrals:  None Maternal Substance Abuse:  No Significant Maternal Medications:  None Significant Maternal Lab Results:  None Other Comments:  GBS, UDS, all labs pending; no PNC/early dating  ROS    Blood pressure 134/81, pulse 105, temperature 98.5 F (36.9 C), temperature source Oral, resp. rate 18, last menstrual period 12/28/2013. Exam Physical Exam  Constitutional: She is oriented to person, place, and time. She appears well-developed.  HENT:  Head: Normocephalic.  Neck: Normal range of motion.  Cardiovascular:  Tachy; reg rhythm  Respiratory: Effort normal.  GI:  Abd: gravid, FH approx 22cm  Genitourinary:  SE: Membranes in vag; sm blood  Musculoskeletal: Normal range of motion.  Neurological: She is alert and oriented to person, place, and time.  Skin: Skin is warm and dry.  Psychiatric: She has a normal mood and affect. Her behavior is normal. Thought content normal.    Prenatal labs: ABO, Rh: --/--/O POS, O POS (10/13 1716) Antibody: NEG (10/13 1716) Rubella: 2.37 (10/13 1716) RPR: NON REAC (10/13  1716)  HBsAg: NEGATIVE (10/13 1716)  HIV:   pending GBS:   pending  Assessment/Plan: IUP@22 .4wks (early US estimate) Incompetent cx  Threatened preterm del  Admit to Birthing Suites Trendelenberg BMZ and mag sulfate started Cultures collected Neo informed and will come see   Cam HaiSHAW, KIMBERLY CNM 05/31/2014, 8:40 AM

## 2014-06-01 LAB — RUBELLA SCREEN: Rubella: 2.13 index (ref >0.99)

## 2014-06-01 LAB — HIV ANTIBODY (ROUTINE TESTING W REFLEX): HIV Screen 4th Generation wRfx: NONREACTIVE

## 2014-06-01 LAB — RPR: RPR Ser Ql: NONREACTIVE

## 2014-06-01 MED ORDER — LANOLIN HYDROUS EX OINT: TOPICAL_OINTMENT | CUTANEOUS | Status: DC | PRN

## 2014-06-01 MED ORDER — BENZOCAINE-MENTHOL 20-0.5 % EX AERO
1.0000 "application " | INHALATION_SPRAY | CUTANEOUS | Status: DC | PRN
  Filled 2014-06-01: qty 56

## 2014-06-01 MED ORDER — ZOLPIDEM TARTRATE 5 MG PO TABS: 5.0000 mg | ORAL_TABLET | Freq: Every evening | ORAL | Status: DC | PRN

## 2014-06-01 MED ORDER — OXYCODONE-ACETAMINOPHEN 5-325 MG PO TABS: 1.0000 | ORAL_TABLET | ORAL | Status: DC | PRN

## 2014-06-01 MED ORDER — ONDANSETRON HCL 4 MG PO TABS: 4.0000 mg | ORAL_TABLET | ORAL | Status: DC | PRN

## 2014-06-01 MED ORDER — WITCH HAZEL-GLYCERIN EX PADS: 1.0000 "application " | MEDICATED_PAD | CUTANEOUS | Status: DC | PRN

## 2014-06-01 MED ORDER — DIBUCAINE 1 % RE OINT
1.0000 "application " | TOPICAL_OINTMENT | RECTAL | Status: DC | PRN
  Filled 2014-06-01: qty 28

## 2014-06-01 MED ORDER — DIPHENHYDRAMINE HCL 25 MG PO CAPS: 25.0000 mg | ORAL_CAPSULE | Freq: Four times a day (QID) | ORAL | Status: DC | PRN

## 2014-06-01 MED ORDER — TETANUS-DIPHTH-ACELL PERTUSSIS 5-2.5-18.5 LF-MCG/0.5 IM SUSP
0.5000 mL | Freq: Once | INTRAMUSCULAR | Status: DC
  Filled 2014-06-01: qty 0.5

## 2014-06-01 MED ORDER — SIMETHICONE 80 MG PO CHEW: 80.0000 mg | CHEWABLE_TABLET | ORAL | Status: DC | PRN

## 2014-06-01 MED ORDER — PRENATAL MULTIVITAMIN CH: 1.0000 | ORAL_TABLET | Freq: Every day | ORAL | Status: DC

## 2014-06-01 MED ORDER — ONDANSETRON HCL 4 MG/2ML IJ SOLN: 4.0000 mg | INTRAMUSCULAR | Status: DC | PRN

## 2014-06-01 MED ORDER — ACETAMINOPHEN 325 MG PO TABS: 650.0000 mg | ORAL_TABLET | ORAL | Status: DC | PRN

## 2014-06-01 MED ORDER — OXYCODONE-ACETAMINOPHEN 5-325 MG PO TABS
2.0000 | ORAL_TABLET | ORAL | Status: DC | PRN
Start: 2014-06-01 — End: 2014-06-01

## 2014-06-01 MED ORDER — SENNOSIDES-DOCUSATE SODIUM 8.6-50 MG PO TABS: 2.0000 | ORAL_TABLET | ORAL | Status: DC

## 2014-06-01 NOTE — Discharge Summary (Signed)
Obstetric Discharge Summary Reason for Admission: onset of labor Prenatal Procedures: none Intrapartum Procedures: Ultrasound Postpartum Procedures: none Complications-Operative and Postpartum: none HEMOGLOBIN  Date Value Ref Range Status  05/31/2014 12.8 12.0 - 15.0 g/dL Final   HCT  Date Value Ref Range Status  05/31/2014 35.8* 36.0 - 46.0 % Final   Brief Summary of Hospital course: Patient presented with contractions with no prenatal care with IUP at 22w by sure LMP.  Steroids were given, terbutaline was given, and patient started on mag while US was done to confirm dating.  Patient continued to contract despite tocolysis. After SVE revealed further fetal parts in vaginal vault, tocolysis was stopped and the inevitable delivery ensued.  See delivery note for details.  Postpartum course uneventful.  Patient being discharged at request.  Follow up in 4 weeks.   Physical Exam:  General: alert, cooperative and no distress Heart: regular rate, no murmur Lungs: clear to auscultation bilaterally, no wheezing.  Lochia: appropriate Uterine Fundus: firm DVT Evaluation: No evidence of DVT seen on physical exam. Negative Homan's sign.  Discharge Diagnoses: previable preterm delivery  Discharge Information: Date: 06/01/2014 Activity: pelvic rest Diet: routine Medications: PNV and Ibuprofen Condition: stable Instructions: refer to practice specific booklet Discharge to: home Follow-up Information    [facility].   Specialty:  Obstetrics and Gynecology   Contact information:   [address] [place] [id number] [phone]      Newborn Data: Live born female  Birth Weight: 1 lb 2.4 oz (522 g) APGAR: 1, 1  Fetal demise.  STINSON, JACOB JEHIEL 06/01/2014, 7:16 AM

## 2014-06-01 NOTE — Progress Notes (Signed)
Reviewed D/C instructions with pt and FOB.  Answered questions.  Pt and FOB verbalized understanding.  Copy of instructions given to pt.  Office will call to schedule appt in 4 weeks.

## 2014-06-01 NOTE — Discharge Instructions (Signed)
Postpartum Depression and Baby Blues °The postpartum period begins right after the birth of a baby. During this time, there is often a great amount of joy and excitement. It is also a time of many changes in the life of the parents. Regardless of how many times a mother gives birth, each child brings new challenges and dynamics to the family. It is not unusual to have feelings of excitement along with confusing shifts in moods, emotions, and thoughts. All mothers are at risk of developing postpartum depression or the "baby blues." These mood changes can occur right after giving birth, or they may occur many months after giving birth. The baby blues or postpartum depression can be mild or severe. Additionally, postpartum depression can go away rather quickly, or it can be a long-term condition.  °CAUSES °Raised hormone levels and the rapid drop in those levels are thought to be a main cause of postpartum depression and the baby blues. A number of hormones change during and after pregnancy. Estrogen and progesterone usually decrease right after the delivery of your baby. The levels of thyroid hormone and various cortisol steroids also rapidly drop. Other factors that play a role in these mood changes include major life events and genetics.  °RISK FACTORS °If you have any of the following risks for the baby blues or postpartum depression, know what symptoms to watch out for during the postpartum period. Risk factors that may increase the likelihood of getting the baby blues or postpartum depression include: °· Having a personal or family history of depression.   °· Having depression while being pregnant.   °· Having premenstrual mood issues or mood issues related to oral contraceptives. °· Having a lot of life stress.   °· Having marital conflict.   °· Lacking a social support network.   °· Having a baby with special needs.   °· Having health problems, such as diabetes.   °SIGNS AND SYMPTOMS °Symptoms of baby blues  include: °· Brief changes in mood, such as going from extreme happiness to sadness. °· Decreased concentration.   °· Difficulty sleeping.   °· Crying spells, tearfulness.   °· Irritability.   °· Anxiety.   °Symptoms of postpartum depression typically begin within the first month after giving birth. These symptoms include: °· Difficulty sleeping or excessive sleepiness.   °· Marked weight loss.   °· Agitation.   °· Feelings of worthlessness.   °· Lack of interest in activity or food.   °Postpartum psychosis is a very serious condition and can be dangerous. Fortunately, it is rare. Displaying any of the following symptoms is cause for immediate medical attention. Symptoms of postpartum psychosis include:  °· Hallucinations and delusions.   °· Bizarre or disorganized behavior.   °· Confusion or disorientation.   °DIAGNOSIS  °A diagnosis is made by an evaluation of your symptoms. There are no medical or lab tests that lead to a diagnosis, but there are various questionnaires that a health care provider may use to identify those with the baby blues, postpartum depression, or psychosis. Often, a screening tool called the Edinburgh Postnatal Depression Scale is used to diagnose depression in the postpartum period.  °TREATMENT °The baby blues usually goes away on its own in 1-2 weeks. Social support is often all that is needed. You will be encouraged to get adequate sleep and rest. Occasionally, you may be given medicines to help you sleep.  °Postpartum depression requires treatment because it can last several months or longer if it is not treated. Treatment may include individual or group therapy, medicine, or both to address any social, physiological, and psychological   factors that may play a role in the depression. Regular exercise, a healthy diet, rest, and social support may also be strongly recommended.  Postpartum psychosis is more serious and needs treatment right away. Hospitalization is often needed. HOME CARE  INSTRUCTIONS  Get as much rest as you can. Nap when the baby sleeps.   Exercise regularly. Some women find yoga and walking to be beneficial.   Eat a balanced and nourishing diet.   Do little things that you enjoy. Have a cup of tea, take a bubble bath, read your favorite magazine, or listen to your favorite music.  Avoid alcohol.   Ask for help with household chores, cooking, grocery shopping, or running errands as needed. Do not try to do everything.   Talk to people close to you about how you are feeling. Get support from your partner, family members, friends, or other new moms.  Try to stay positive in how you think. Think about the things you are grateful for.   Do not spend a lot of time alone.   Only take over-the-counter or prescription medicine as directed by your health care provider.  Keep all your postpartum appointments.   Let your health care provider know if you have any concerns.  SEEK MEDICAL CARE IF: You are having a reaction to or problems with your medicine. SEEK IMMEDIATE MEDICAL CARE IF:  You have suicidal feelings.   You think you may harm the baby or someone else. MAKE SURE YOU:  Understand these instructions.  Will watch your condition.  Will get help right away if you are not doing well or get worse. Document Released: 10/02/2003 Document Revised: 01/02/2013 Document Reviewed: 10/09/2012 [facility] Patient Information 2015 [place], [place]. This information is not intended to replace advice given to you by your health care provider. Make sure you discuss any questions you have with your health care provider.   Levonorgestrel intrauterine device (IUD) What is this medicine? LEVONORGESTREL IUD (LEE voe nor jes trel) is a contraceptive (birth control) device. The device is placed inside the uterus by a healthcare professional. It is used to prevent pregnancy and can also be used to treat heavy bleeding that occurs during your period.  Depending on the device, it can be used for 3 to 5 years. This medicine may be used for other purposes; ask your health care provider or pharmacist if you have questions. COMMON BRAND NAME(S): Elveria Royals What should I tell my health care provider before I take this medicine? They need to know if you have any of these conditions: -abnormal Pap smear -cancer of the breast, uterus, or cervix -diabetes -endometritis -genital or pelvic infection now or in the past -have more than one sexual partner or your partner has more than one partner -heart disease -history of an ectopic or tubal pregnancy -immune system problems -IUD in place -liver disease or tumor -problems with blood clots or take blood-thinners -use intravenous drugs -uterus of unusual shape -vaginal bleeding that has not been explained -an unusual or allergic reaction to levonorgestrel, other hormones, silicone, or polyethylene, medicines, foods, dyes, or preservatives -pregnant or trying to get pregnant -breast-feeding How should I use this medicine? This device is placed inside the uterus by a health care professional. Talk to your pediatrician regarding the use of this medicine in children. Special care may be needed. Overdosage: If you think you have taken too much of this medicine contact a poison control center or emergency room at once. NOTE: This medicine is  only for you. Do not share this medicine with others. What if I miss a dose? This does not apply. What may interact with this medicine? Do not take this medicine with any of the following medications: -amprenavir -bosentan -fosamprenavir This medicine may also interact with the following medications: -aprepitant -barbiturate medicines for inducing sleep or treating seizures -bexarotene -griseofulvin -medicines to treat seizures like carbamazepine, ethotoin, felbamate, oxcarbazepine, phenytoin,  topiramate -modafinil -pioglitazone -rifabutin -rifampin -rifapentine -some medicines to treat HIV infection like atazanavir, indinavir, lopinavir, nelfinavir, tipranavir, ritonavir -St. John's wort -warfarin This list may not describe all possible interactions. Give your health care provider a list of all the medicines, herbs, non-prescription drugs, or dietary supplements you use. Also tell them if you smoke, drink alcohol, or use illegal drugs. Some items may interact with your medicine. What should I watch for while using this medicine? Visit your doctor or health care professional for regular check ups. See your doctor if you or your partner has sexual contact with others, becomes HIV positive, or gets a sexual transmitted disease. This product does not protect you against HIV infection (AIDS) or other sexually transmitted diseases. You can check the placement of the IUD yourself by reaching up to the top of your vagina with clean fingers to feel the threads. Do not pull on the threads. It is a good habit to check placement after each menstrual period. Call your doctor right away if you feel more of the IUD than just the threads or if you cannot feel the threads at all. The IUD may come out by itself. You may become pregnant if the device comes out. If you notice that the IUD has come out use a backup birth control method like condoms and call your health care provider. Using tampons will not change the position of the IUD and are okay to use during your period. What side effects may I notice from receiving this medicine? Side effects that you should report to your doctor or health care professional as soon as possible: -allergic reactions like skin rash, itching or hives, swelling of the face, lips, or tongue -fever, flu-like symptoms -genital sores -high blood pressure -no menstrual period for 6 weeks during use -pain, swelling, warmth in the leg -pelvic pain or tenderness -severe or  sudden headache -signs of pregnancy -stomach cramping -sudden shortness of breath -trouble with balance, talking, or walking -unusual vaginal bleeding, discharge -yellowing of the eyes or skin Side effects that usually do not require medical attention (report to your doctor or health care professional if they continue or are bothersome): -acne -breast pain -change in sex drive or performance -changes in weight -cramping, dizziness, or faintness while the device is being inserted -headache -irregular menstrual bleeding within first 3 to 6 months of use -nausea This list may not describe all possible side effects. Call your doctor for medical advice about side effects. You may report side effects to FDA at 1-800-FDA-1088. Where should I keep my medicine? This does not apply. NOTE: This sheet is a summary. It may not cover all possible information. If you have questions about this medicine, talk to your doctor, pharmacist, or health care provider.  2015, [employer]. (2011-01-28 13:54:04)   Etonogestrel implant What is this medicine? ETONOGESTREL (et oh noe JES trel) is a contraceptive (birth control) device. It is used to prevent pregnancy. It can be used for up to 3 years. This medicine may be used for other purposes; ask your health care provider or pharmacist  if you have questions. COMMON BRAND NAME(S): Implanon, Nexplanon What should I tell my health care provider before I take this medicine? They need to know if you have any of these conditions: -abnormal vaginal bleeding -blood vessel disease or blood clots -cancer of the breast, cervix, or liver -depression -diabetes -gallbladder disease -headaches -heart disease or recent heart attack -high blood pressure -high cholesterol -kidney disease -liver disease -renal disease -seizures -tobacco smoker -an unusual or allergic reaction to etonogestrel, other hormones, anesthetics or antiseptics, medicines, foods,  dyes, or preservatives -pregnant or trying to get pregnant -breast-feeding How should I use this medicine? This device is inserted just under the skin on the inner side of your upper arm by a health care professional. Talk to your pediatrician regarding the use of this medicine in children. Special care may be needed. Overdosage: If you think you've taken too much of this medicine contact a poison control center or emergency room at once. Overdosage: If you think you have taken too much of this medicine contact a poison control center or emergency room at once. NOTE: This medicine is only for you. Do not share this medicine with others. What if I miss a dose? This does not apply. What may interact with this medicine? Do not take this medicine with any of the following medications: -amprenavir -bosentan -fosamprenavir This medicine may also interact with the following medications: -barbiturate medicines for inducing sleep or treating seizures -certain medicines for fungal infections like ketoconazole and itraconazole -griseofulvin -medicines to treat seizures like carbamazepine, felbamate, oxcarbazepine, phenytoin, topiramate -modafinil -phenylbutazone -rifampin -some medicines to treat HIV infection like atazanavir, indinavir, lopinavir, nelfinavir, tipranavir, ritonavir -St. John's wort This list may not describe all possible interactions. Give your health care provider a list of all the medicines, herbs, non-prescription drugs, or dietary supplements you use. Also tell them if you smoke, drink alcohol, or use illegal drugs. Some items may interact with your medicine. What should I watch for while using this medicine? This product does not protect you against HIV infection (AIDS) or other sexually transmitted diseases. You should be able to feel the implant by pressing your fingertips over the skin where it was inserted. Tell your doctor if you cannot feel the implant. What side  effects may I notice from receiving this medicine? Side effects that you should report to your doctor or health care professional as soon as possible: -allergic reactions like skin rash, itching or hives, swelling of the face, lips, or tongue -breast lumps -changes in vision -confusion, trouble speaking or understanding -dark urine -depressed mood -general ill feeling or flu-like symptoms -light-colored stools -loss of appetite, nausea -right upper belly pain -severe headaches -severe pain, swelling, or tenderness in the abdomen -shortness of breath, chest pain, swelling in a leg -signs of pregnancy -sudden numbness or weakness of the face, arm or leg -trouble walking, dizziness, loss of balance or coordination -unusual vaginal bleeding, discharge -unusually weak or tired -yellowing of the eyes or skin Side effects that usually do not require medical attention (Report these to your doctor or health care professional if they continue or are bothersome.): -acne -breast pain -changes in weight -cough -fever or chills -headache -irregular menstrual bleeding -itching, burning, and vaginal discharge -pain or difficulty passing urine -sore throat This list may not describe all possible side effects. Call your doctor for medical advice about side effects. You may report side effects to FDA at 1-800-FDA-1088. Where should I keep my medicine? This drug is given in a  hospital or clinic and will not be stored at home. NOTE: This sheet is a summary. It may not cover all possible information. If you have questions about this medicine, talk to your doctor, pharmacist, or health care provider.  2015, [employer]. (2011-07-05 15:37:45)

## 2014-06-03 LAB — GC/CHLAMYDIA PROBE AMP (~~LOC~~) NOT AT ARMC
Chlamydia: NEGATIVE
Neisseria Gonorrhea: NEGATIVE

## 2014-06-04 LAB — HIV-1 RNA ULTRAQUANT REFLEX TO GENTYP+: HIV-1 RNA Quant, Log: UNDETERMINED log10copy/mL

## 2014-06-17 ENCOUNTER — Encounter (HOSPITAL_COMMUNITY): Payer: Self-pay | Admitting: *Deleted

## 2014-06-27 ENCOUNTER — Encounter: Payer: Medicaid Other | Admitting: Family Medicine

## 2014-10-01 ENCOUNTER — Encounter (HOSPITAL_COMMUNITY): Payer: Self-pay | Admitting: *Deleted

## 2014-10-01 ENCOUNTER — Inpatient Hospital Stay (HOSPITAL_COMMUNITY)
Admission: AD | Admit: 2014-10-01 | Discharge: 2014-10-01 | Disposition: A | Discharge status: Home / Self Care | Payer: Medicaid Other | Source: Ambulatory Visit | Attending: Obstetrics & Gynecology | Admitting: Obstetrics & Gynecology

## 2014-10-01 DIAGNOSIS — N911 Secondary amenorrhea: Secondary | ICD-10-CM | POA: Diagnosis not present

## 2014-10-01 DIAGNOSIS — Z3200 Encounter for pregnancy test, result unknown: Secondary | ICD-10-CM | POA: Diagnosis present

## 2014-10-01 DIAGNOSIS — Z3201 Encounter for pregnancy test, result positive: Secondary | ICD-10-CM | POA: Diagnosis not present

## 2014-10-01 LAB — POCT PREGNANCY, URINE: Preg Test, Ur: POSITIVE — AB

## 2014-10-01 NOTE — Discharge Instructions (Signed)
Pregnancy, The Father's Role A father has an important role during their partners pregnancy, labor, delivery and afterward. It is important to help and support your partner through this new period. There are many physical and emotional changes that happen. To be helpful and supportive during this time, you should know and understand what is happening to your partner during the pregnancy, labor, delivery and postpartum period.  PREGNANCY Pregnancy lasts 40 weeks (plus or minus 2 weeks). The pregnancy is divided into three trimesters.   In the first 13 weeks, the mother feels tired, has painful breasts, may feel sick to her stomach (nauseated), throw up (vomit), urinates more often and may have mood changes. All of these changes are normal. If the father is aware of these, he can be more helpful, supportive and understanding. This may include helping with household duties and activities and spending more time with each other.  In the next 14 to 28 weeks, your partner is over the tiredness, nausea and vomiting. She will likely feel better and more energetic. This is the best time of the pregnancy to be more active together, go out more often or take trips. You will be able to see her belly popping out with the pregnancy. You may be able to feel the baby kick.  In the last 12 weeks, she may become more uncomfortable again because her abdomen is popping out more as the baby grows. She may have a hard time doing household chores, her balance may be off, she may have a hard time bending over, tires easily and has a tough time sleeping. At this time, you will realize the birth of your baby is close. You and your partner may have concerns about the safety of your partner and if the baby will be normal and healthy. These are all normal and natural feelings. You should talk with each other and your caregiver if you have any questions. Attend prenatal care visits with your partner. This is a good time for you to get  to know your caregiver, follow the pregnancy and ask questions. Prenatal visits are once a month for 6 months, then they are every 2 weeks for 2 months and then once a week the last month. You may have more prenatal visits if your caregiver feels it is needed. Your caregiver usually does an ultrasound of the baby at one of the prenatal visits or more often if needed. It is an exciting and emotional to see the baby moving and the heart beating.  Fathers can experience emotional changes during this time as well. These emotions can include happiness, excitement and feeling proud. Fathers may also be concerned about having new responsibilities. These include financial, educational and if it will change the relationship with his partner. These feelings are normal. They should be talked about openly and positively with each other. An important and often asked question is if sexual intercourse is safe during pregnancy and if it will harm the baby. Sexual intercourse is safe unless there is a problem with the pregnancy and your caregiver advises you to not have sexual intercourse. Because physical and emotional changes happen in pregnancy, your partner may not want to have sex during certain times. This is mostly true in the first and third trimesters. Trying different positions may make sexual intercourse comfortable. It is important for the both of you to discuss your feelings and desires with this problem. Talk to your caregiver about any questions you have about sexual intercourse during the  pregnancy. LABOR AND DELIVERY There are childbirth classes available for couples to take together. They help you understand what happens during labor and delivery. They also teach you how to help your partner with her labor pains, how to relax, breath properly during a contraction and focus on what is happening during labor. You may be asked to time the contractions, massage her back and breath with her during the contractions.  You are also there to see and enjoy the excitement your baby being born. If you have any feelings of fainting or are uncomfortable, tell someone to help you. You may be asked to leave the room if a problem develops during the labor or delivery. Sometimes a Cesarean Section (C-section) is scheduled or is an emergency during labor and delivery. A C-section is a major operation to deliver the baby. It is done through an incision in the abdomen and uterus. Your partner will be given a medicine to make her sleep (general anesthesia) or spinal anesthesia (numbing the body from the waist down). Most hospitals allow the father in the room for a C-section unless it is an emergency. Recovery from a C-section takes longer, is more uncomfortable and will require more help from the father. AFTER DELIVERY After the baby is born, the mother goes through many changes again. These changes could last 4 to 6 weeks or longer following a C-section. It is not unusual to be anxious, concerned and afraid that you may not be taking care of your newborn baby properly. Your partner may take a while to regain her strength. She may also get feelings of sadness (postpartum blues or depression), which is a more serious condition that may require medical treatment.  Your partner may decide to breastfeed the baby. This helps with bonding between the mother and the baby. Breastfeeding is the best way to feed the baby, but you may feel "left out." However, you can feel included by burping the baby and bottle feeding the baby with breast milk (collected by the mother) to give your partner some rest. This also helps you to bond with the baby. Breastfeeding mothers can get pregnant even if they are not having menstrual periods. Therefore, some form of birth control should be used if you do not want to get pregnant. Another question and concern is when it is safe to have sexual intercourse again. Usually it takes 4 to 6 weeks for healing to be over  with. It may take longer after a C-section. If you have any questions about having sexual intercourse or if it is painful, talk to your caregiver. As a father, you will be adjusting your role as the baby grows. Fatherhood is a on-going [employer]. You and your partner should still make time to be together alone and be the couple you were before the baby was born. This is helpful for you, your partner and your baby. As you can see, it is important for a father to be helpful, understanding and supportive during this special time. Document Released: 06/16/2007 Document Revised: 03/22/2011 Document Reviewed: 06/16/2007 [facility] Patient Information 2015 [place], [place]. This information is not intended to replace advice given to you by your health care provider. Make sure you discuss any questions you have with your health care provider. First Trimester of Pregnancy The first trimester of pregnancy is from week 1 until the end of week 12 (months 1 through 3). A week after a sperm fertilizes an egg, the egg will implant on the wall of the  uterus. This embryo will begin to develop into a baby. Genes from you and your partner are forming the baby. The female genes determine whether the baby is a boy or a girl. At 6-8 weeks, the eyes and face are formed, and the heartbeat can be seen on ultrasound. At the end of 12 weeks, all the baby's organs are formed.  Now that you are pregnant, you will want to do everything you can to have a healthy baby. Two of the most important things are to get good prenatal care and to follow your health care provider's instructions. Prenatal care is all the medical care you receive before the baby's birth. This care will help prevent, find, and treat any problems during the pregnancy and childbirth. BODY CHANGES Your body goes through many changes during pregnancy. The changes vary from woman to woman.   You may gain or lose a couple of pounds at first.  You may feel sick to  your stomach (nauseous) and throw up (vomit). If the vomiting is uncontrollable, call your health care provider.  You may tire easily.  You may develop headaches that can be relieved by medicines approved by your health care provider.  You may urinate more often. Painful urination may mean you have a bladder infection.  You may develop heartburn as a result of your pregnancy.  You may develop constipation because certain hormones are causing the muscles that push waste through your intestines to slow down.  You may develop hemorrhoids or swollen, bulging veins (varicose veins).  Your breasts may begin to grow larger and become tender. Your nipples may stick out more, and the tissue that surrounds them (areola) may become darker.  Your gums may bleed and may be sensitive to brushing and flossing.  Dark spots or blotches (chloasma, mask of pregnancy) may develop on your face. This will likely fade after the baby is born.  Your menstrual periods will stop.  You may have a loss of appetite.  You may develop cravings for certain kinds of food.  You may have changes in your emotions from day to day, such as being excited to be pregnant or being concerned that something may go wrong with the pregnancy and baby.  You may have more vivid and strange dreams.  You may have changes in your hair. These can include thickening of your hair, rapid growth, and changes in texture. Some women also have hair loss during or after pregnancy, or hair that feels dry or thin. Your hair will most likely return to normal after your baby is born. WHAT TO EXPECT AT YOUR PRENATAL VISITS During a routine prenatal visit:  You will be weighed to make sure you and the baby are growing normally.  Your blood pressure will be taken.  Your abdomen will be measured to track your baby's growth.  The fetal heartbeat will be listened to starting around week 10 or 12 of your pregnancy.  Test results from any previous  visits will be discussed. Your health care provider may ask you:  How you are feeling.  If you are feeling the baby move.  If you have had any abnormal symptoms, such as leaking fluid, bleeding, severe headaches, or abdominal cramping.  If you have any questions. Other tests that may be performed during your first trimester include:  Blood tests to find your blood type and to check for the presence of any previous infections. They will also be used to check for low iron levels (anemia)  and Rh antibodies. Later in the pregnancy, blood tests for diabetes will be done along with other tests if problems develop.  Urine tests to check for infections, diabetes, or protein in the urine.  An ultrasound to confirm the proper growth and development of the baby.  An amniocentesis to check for possible genetic problems.  Fetal screens for spina bifida and Down syndrome.  You may need other tests to make sure you and the baby are doing well. HOME CARE INSTRUCTIONS  Medicines  Follow your health care provider's instructions regarding medicine use. Specific medicines may be either safe or unsafe to take during pregnancy.  Take your prenatal vitamins as directed.  If you develop constipation, try taking a stool softener if your health care provider approves. Diet  Eat regular, well-balanced meals. Choose a variety of foods, such as meat or vegetable-based protein, fish, milk and low-fat dairy products, vegetables, fruits, and whole grain breads and cereals. Your health care provider will help you determine the amount of weight gain that is right for you.  Avoid raw meat and uncooked cheese. These carry germs that can cause birth defects in the baby.  Eating four or five small meals rather than three large meals a day may help relieve nausea and vomiting. If you start to feel nauseous, eating a few soda crackers can be helpful. Drinking liquids between meals instead of during meals also seems to  help nausea and vomiting.  If you develop constipation, eat more high-fiber foods, such as fresh vegetables or fruit and whole grains. Drink enough fluids to keep your urine clear or pale yellow. Activity and Exercise  Exercise only as directed by your health care provider. Exercising will help you:  Control your weight.  Stay in shape.  Be prepared for labor and delivery.  Experiencing pain or cramping in the lower abdomen or low back is a good sign that you should stop exercising. Check with your health care provider before continuing normal exercises.  Try to avoid standing for long periods of time. Move your legs often if you must stand in one place for a long time.  Avoid heavy lifting.  Wear low-heeled shoes, and practice good posture.  You may continue to have sex unless your health care provider directs you otherwise. Relief of Pain or Discomfort  Wear a good support bra for breast tenderness.   Take warm sitz baths to soothe any pain or discomfort caused by hemorrhoids. Use hemorrhoid cream if your health care provider approves.   Rest with your legs elevated if you have leg cramps or low back pain.  If you develop varicose veins in your legs, wear support hose. Elevate your feet for 15 minutes, 3-4 times a day. Limit salt in your diet. Prenatal Care  Schedule your prenatal visits by the twelfth week of pregnancy. They are usually scheduled monthly at first, then more often in the last 2 months before delivery.  Write down your questions. Take them to your prenatal visits.  Keep all your prenatal visits as directed by your health care provider. Safety  Wear your seat belt at all times when driving.  Make a list of emergency phone numbers, including numbers for family, friends, the hospital, and police and fire departments. General Tips  Ask your health care provider for a referral to a local prenatal education class. Begin classes no later than at the beginning  of month 6 of your pregnancy.  Ask for help if you have counseling or nutritional  needs during pregnancy. Your health care provider can offer advice or refer you to specialists for help with various needs.  Do not use hot tubs, steam rooms, or saunas.  Do not douche or use tampons or scented sanitary pads.  Do not cross your legs for long periods of time.  Avoid cat litter boxes and soil used by cats. These carry germs that can cause birth defects in the baby and possibly loss of the fetus by miscarriage or stillbirth.  Avoid all smoking, herbs, alcohol, and medicines not prescribed by your health care provider. Chemicals in these affect the formation and growth of the baby.  Schedule a dentist appointment. At home, brush your teeth with a soft toothbrush and be gentle when you floss. SEEK MEDICAL CARE IF:   You have dizziness.  You have mild pelvic cramps, pelvic pressure, or nagging pain in the abdominal area.  You have persistent nausea, vomiting, or diarrhea.  You have a bad smelling vaginal discharge.  You have pain with urination.  You notice increased swelling in your face, hands, legs, or ankles. SEEK IMMEDIATE MEDICAL CARE IF:   You have a fever.  You are leaking fluid from your vagina.  You have spotting or bleeding from your vagina.  You have severe abdominal cramping or pain.  You have rapid weight gain or loss.  You vomit blood or material that looks like coffee grounds.  You are exposed to [place] measles and have never had them.  You are exposed to fifth disease or chickenpox.  You develop a severe headache.  You have shortness of breath.  You have any kind of trauma, such as from a fall or a car accident. Document Released: 12/22/2000 Document Revised: 05/14/2013 Document Reviewed: 11/07/2012 [facility] Patient Information 2015 [place], [place]. This information is not intended to replace advice given to you by your health care provider. Make sure you  discuss any questions you have with your health care provider. Constipation Constipation is when a person:  Poops (has a bowel movement) less than 3 times a week.  Has a hard time pooping.  Has poop that is dry, hard, or bigger than normal. HOME CARE   Eat foods with a lot of fiber in them. This includes fruits, vegetables, beans, and whole grains such as brown rice.  Avoid fatty foods and foods with a lot of sugar. This includes french fries, hamburgers, cookies, candy, and soda.  If you are not getting enough fiber from food, take products with added fiber in them (supplements).  Drink enough fluid to keep your pee (urine) clear or pale yellow.  Exercise on a regular basis, or as told by your doctor.  Go to the restroom when you feel like you need to poop. Do not hold it.  Only take medicine as told by your doctor. Do not take medicines that help you poop (laxatives) without talking to your doctor first. GET HELP RIGHT AWAY IF:   You have bright red blood in your poop (stool).  Your constipation lasts more than 4 days or gets worse.  You have belly (abdominal) or butt (rectal) pain.  You have thin poop (as thin as a pencil).  You lose weight, and it cannot be explained. MAKE SURE YOU:   Understand these instructions.  Will watch your condition.  Will get help right away if you are not doing well or get worse. Document Released: 06/16/2007 Document Revised: 01/02/2013 Document Reviewed: 10/09/2012 [facility] Patient Information 2015 [place], [place]. This information  is not intended to replace advice given to you by your health care provider. Make sure you discuss any questions you have with your health care provider.

## 2014-10-01 NOTE — MAU Provider Note (Signed)
S:  Margaret Keller is a 19 y.o. G3P0201 at [redacted]w[redacted]d wks here for confirmation of pregnancy. Patient's last menstrual period was 08/15/2014.Marland Kitchen  Denies any vaginal bleeding or abdominal pain. Plans to get care in [facility] d/t previous 20 wk & 22 wk deliveries.   O: OB History  Gravida Para Term Preterm AB SAB TAB Ectopic Multiple Living  0 1    # Outcome Date GA Lbr Len/2nd Weight Sex Delivery Anes PTL Lv  3 Current           2 Preterm 05/31/14 [redacted]w[redacted]d 08:50 / 01:42 1 lb 2.4 oz (0.522 kg) M Vag-Spont None  Y     Comments: 22 weeks. Eyes fused.  1 Preterm 10/23/13 [redacted]w[redacted]d 04:31 / 00:02 12.2 oz (0.346 kg) M Vag-Spont None  FD       Past Medical History  Diagnosis Date  . Preterm labor     No family history on file.   Filed Vitals:   10/01/14 0959  BP: 134/75  Pulse: 96  Temp: 98.3 F (36.8 C)  Resp: 18   General:  A&OX3 with no signs of acute distress. She appears well-developed and well-nourished. No distress.  Neck: Normal range of motion.  Pulmonary/Chest: Effort normal. No respiratory distress.  Musculoskeletal: Normal range of motion.  Neurological: She is alert and oriented to person, place, and time.  Skin: Skin is warm and dry.   A: Positive Pregnancy Test  P: Explained benefits of taking prenatal vitamins w/folic acid early in pregnancy. Begin prenatal care. Reviewed warning signs of pregnancy. Pregnancy verification letter given.  Judeth Horn, NP

## 2014-10-01 NOTE — MAU Note (Signed)
Just here to see if preg, did home test 2 wks ago- was positive.  Just wanting to confirm. No problems

## 2014-11-07 ENCOUNTER — Encounter: Payer: Self-pay | Admitting: Family Medicine

## 2014-11-07 ENCOUNTER — Ambulatory Visit (INDEPENDENT_AMBULATORY_CARE_PROVIDER_SITE_OTHER): Payer: Self-pay | Admitting: Family Medicine

## 2014-11-07 VITALS — BP 134/78 | HR 86 | Ht 64.0 in | Wt 131.0 lb

## 2014-11-07 DIAGNOSIS — O09219 Supervision of pregnancy with history of pre-term labor, unspecified trimester: Secondary | ICD-10-CM

## 2014-11-07 DIAGNOSIS — R030 Elevated blood-pressure reading, without diagnosis of hypertension: Secondary | ICD-10-CM

## 2014-11-07 DIAGNOSIS — O09212 Supervision of pregnancy with history of pre-term labor, second trimester: Secondary | ICD-10-CM

## 2014-11-07 DIAGNOSIS — O09899 Supervision of other high risk pregnancies, unspecified trimester: Secondary | ICD-10-CM | POA: Insufficient documentation

## 2014-11-07 DIAGNOSIS — O3430 Maternal care for cervical incompetence, unspecified trimester: Secondary | ICD-10-CM | POA: Insufficient documentation

## 2014-11-07 DIAGNOSIS — Z23 Encounter for immunization: Secondary | ICD-10-CM

## 2014-11-07 DIAGNOSIS — Z124 Encounter for screening for malignant neoplasm of cervix: Secondary | ICD-10-CM

## 2014-11-07 DIAGNOSIS — O3442 Maternal care for other abnormalities of cervix, second trimester: Secondary | ICD-10-CM

## 2014-11-07 DIAGNOSIS — O09892 Supervision of other high risk pregnancies, second trimester: Secondary | ICD-10-CM

## 2014-11-07 DIAGNOSIS — O0990 Supervision of high risk pregnancy, unspecified, unspecified trimester: Secondary | ICD-10-CM | POA: Insufficient documentation

## 2014-11-07 DIAGNOSIS — Z113 Encounter for screening for infections with a predominantly sexual mode of transmission: Secondary | ICD-10-CM

## 2014-11-07 DIAGNOSIS — Z3482 Encounter for supervision of other normal pregnancy, second trimester: Secondary | ICD-10-CM

## 2014-11-07 DIAGNOSIS — O3432 Maternal care for cervical incompetence, second trimester: Secondary | ICD-10-CM

## 2014-11-07 DIAGNOSIS — IMO0001 Reserved for inherently not codable concepts without codable children: Secondary | ICD-10-CM

## 2014-11-07 LAB — POCT URINALYSIS DIP (DEVICE)
BILIRUBIN URINE: NEGATIVE
Glucose, UA: NEGATIVE mg/dL
Hgb urine dipstick: NEGATIVE
Ketones, ur: NEGATIVE mg/dL
Nitrite: NEGATIVE
PROTEIN: NEGATIVE mg/dL
Specific Gravity, Urine: 1.025 (ref 1.005–1.030)
Urobilinogen, UA: 0.2 mg/dL (ref 0.0–1.0)
pH: 7 (ref 5.0–8.0)

## 2014-11-07 LAB — COMPREHENSIVE METABOLIC PANEL
ALBUMIN: 3.8 g/dL (ref 3.6–5.1)
ALT: 12 U/L (ref 6–29)
AST: 18 U/L (ref 10–30)
Alkaline Phosphatase: 46 U/L (ref 33–115)
BUN: 6 mg/dL — ABNORMAL LOW (ref 7–25)
CHLORIDE: 103 mmol/L (ref 98–110)
CO2: 23 mmol/L (ref 20–31)
Calcium: 9.1 mg/dL (ref 8.6–10.2)
Creat: 0.54 mg/dL (ref 0.50–1.10)
Glucose, Bld: 71 mg/dL (ref 65–99)
POTASSIUM: 4.4 mmol/L (ref 3.5–5.3)
Sodium: 137 mmol/L (ref 135–146)
Total Bilirubin: 0.3 mg/dL (ref 0.2–1.2)
Total Protein: 6.9 g/dL (ref 6.1–8.1)

## 2014-11-07 LAB — OB RESULTS CONSOLE GC/CHLAMYDIA: GC PROBE AMP, GENITAL: NEGATIVE

## 2014-11-07 NOTE — Progress Notes (Signed)
Has certain lmp of 07/15/14.

## 2014-11-07 NOTE — Progress Notes (Signed)
Ultrasound and genetic counseling scheduled with MFM on 11/13/2014 @ 10:00AM.

## 2014-11-07 NOTE — Progress Notes (Signed)
Subjective:  Maye HidesShadae T Beightol is a 19 y.o. G3P0200 at 5737w3d being seen today for ongoing prenatal care.  [name]  Contractions: Not present.  Vag. Bleeding: None. Movement: Present. Denies leaking of fluid.   [name], current medications, past family history, past medical history, past social history, past surgical history and problem list. Problem list updated.  Objective:   Filed Vitals:   11/07/14 0807 11/07/14 0810 11/07/14 0907  BP: 148/86 150/90   Pulse: 86    Height:   5\' 4"  (1.626 m)  Weight: 131 lb (59.421 kg)      Fetal Status: Fetal Heart Rate (bpm): 140   Movement: Present     General:  Alert, oriented and cooperative. Patient is in no acute distress.  Skin: Skin is warm and dry. No rash noted.   Cardiovascular: Normal heart rate noted  Respiratory: Normal respiratory effort, no problems with respiration noted  Abdomen: Soft, gravid, appropriate for gestational age. Pain/Pressure: Absent     Pelvic: Vag. Bleeding: None     Cervical exam performed        Extremities: Normal range of motion.  Edema: None  Mental Status: Normal mood and affect. Normal behavior. Normal judgment and thought content.   Urinalysis: Urine Protein: Negative Urine Glucose: Negative  Assessment and Plan:  Pregnancy: G3P0200 at 5537w3d  1. History of preterm delivery, currently pregnant, second trimester - AMB Referral to Maternal Fetal Medicine (MFM)  2. Supervision of normal pregnancy, antepartum, second trimester - added CWH box - Prenatal (OB Panel) - Culture, OB Urine - Prescript Monitor Profile(19) - Cytology - PAP - GC[facility]Chlamydia probe amp [facility] - Flu shot today - AMB Referral to Maternal Fetal Medicine (MFM) - Patient desires Quad Screen for genetic counseling  3. Cervical insufficiency during pregnancy, antepartum, second trimester Patient with  history of two second trimester losses with cervical dilation. Needs ASAP US to assess cervix and will possibly need cerclage. Patient is eligible for 17 P - AMB Referral to Maternal Fetal Medicine (MFM) - Patient signed 17 P application  4. Elevated blood pressure  - Repeat was improved. Patient has h/o high normal BP in other pregnancies.  - Will get baseline UPC and CMP   Preterm labor symptoms and general obstetric precautions including but not limited to vaginal bleeding, contractions, leaking of fluid and fetal movement were reviewed in detail with the patient. Please refer to After Visit Summary for other counseling recommendations.  Return in about 1 week (around 11/14/2014) for Routine prenatal care.   Federico FlakeKimberly Niles Newton, MD

## 2014-11-07 NOTE — Progress Notes (Signed)
Nutrition note: 1st visit consult Pt has gained 9# @ 4465w3d, which is slightly > expected. Pt reports eating 3 meals & 2 snacks/d. Pt is taking a PNV. Pt reports no N&V or heartburn.  Pt received verbal & written education on general nutrition during pregnancy. Discussed BF tip of the week. Discussed wt gain goals of 25-35# or 1#/wk. Pt agrees to continue taking her PNV. Pt does not have WIC but plans to apply. Pt plans to BF. F/u as needed Blondell RevealLaura Semiyah Newgent, MS, RD, LDN, [facility]

## 2014-11-07 NOTE — Patient Instructions (Addendum)
10 AM Genetic Counseling and then US on 11/2  Second Trimester of Pregnancy The second trimester is from week 13 through week 28, months 4 through 6. The second trimester is often a time when you feel your best. Your body has also adjusted to being pregnant, and you begin to feel better physically. Usually, morning sickness has lessened or quit completely, you may have more energy, and you may have an increase in appetite. The second trimester is also a time when the fetus is growing rapidly. At the end of the sixth month, the fetus is about 9 inches long and weighs about 1 pounds. You will likely begin to feel the baby move (quickening) between 18 and 20 weeks of the pregnancy. BODY CHANGES Your body goes through many changes during pregnancy. The changes vary from woman to woman.   Your weight will continue to increase. You will notice your lower abdomen bulging out.  You may begin to get stretch marks on your hips, abdomen, and breasts.  You may develop headaches that can be relieved by medicines approved by your health care provider.  You may urinate more often because the fetus is pressing on your bladder.  You may develop or continue to have heartburn as a result of your pregnancy.  You may develop constipation because certain hormones are causing the muscles that push waste through your intestines to slow down.  You may develop hemorrhoids or swollen, bulging veins (varicose veins).  You may have back pain because of the weight gain and pregnancy hormones relaxing your joints between the bones in your pelvis and as a result of a shift in weight and the muscles that support your balance.  Your breasts will continue to grow and be tender.  Your gums may bleed and may be sensitive to brushing and flossing.  Dark spots or blotches (chloasma, mask of pregnancy) may develop on your face. This will likely fade after the baby is born.  A dark line from your belly button to the pubic area  (linea nigra) may appear. This will likely fade after the baby is born.  You may have changes in your hair. These can include thickening of your hair, rapid growth, and changes in texture. Some women also have hair loss during or after pregnancy, or hair that feels dry or thin. Your hair will most likely return to normal after your baby is born. WHAT TO EXPECT AT YOUR PRENATAL VISITS During a routine prenatal visit:  You will be weighed to make sure you and the fetus are growing normally.  Your blood pressure will be taken.  Your abdomen will be measured to track your baby's growth.  The fetal heartbeat will be listened to.  Any test results from the previous visit will be discussed. Your health care provider may ask you:  How you are feeling.  If you are feeling the baby move.  If you have had any abnormal symptoms, such as leaking fluid, bleeding, severe headaches, or abdominal cramping.  If you are using any tobacco products, including cigarettes, chewing tobacco, and electronic cigarettes.  If you have any questions. Other tests that may be performed during your second trimester include:  Blood tests that check for:  Low iron levels (anemia).  Gestational diabetes (between 24 and 28 weeks).  Rh antibodies.  Urine tests to check for infections, diabetes, or protein in the urine.  An ultrasound to confirm the proper growth and development of the baby.  An amniocentesis to check  for possible genetic problems.  Fetal screens for spina bifida and Down syndrome.  HIV (human immunodeficiency virus) testing. Routine prenatal testing includes screening for HIV, unless you choose not to have this test. HOME CARE INSTRUCTIONS   Avoid all smoking, herbs, alcohol, and unprescribed drugs. These chemicals affect the formation and growth of the baby.  Do not use any tobacco products, including cigarettes, chewing tobacco, and electronic cigarettes. If you need help quitting, ask  your health care provider. You may receive counseling support and other resources to help you quit.  Follow your health care provider's instructions regarding medicine use. There are medicines that are either safe or unsafe to take during pregnancy.  Exercise only as directed by your health care provider. Experiencing uterine cramps is a good sign to stop exercising.  Continue to eat regular, healthy meals.  Wear a good support bra for breast tenderness.  Do not use hot tubs, steam rooms, or saunas.  Wear your seat belt at all times when driving.  Avoid raw meat, uncooked cheese, cat litter boxes, and soil used by cats. These carry germs that can cause birth defects in the baby.  Take your prenatal vitamins.  Take 1500-2000 mg of calcium daily starting at the 20th week of pregnancy until you deliver your baby.  Try taking a stool softener (if your health care provider approves) if you develop constipation. Eat more high-fiber foods, such as fresh vegetables or fruit and whole grains. Drink plenty of fluids to keep your urine clear or pale yellow.  Take warm sitz baths to soothe any pain or discomfort caused by hemorrhoids. Use hemorrhoid cream if your health care provider approves.  If you develop varicose veins, wear support hose. Elevate your feet for 15 minutes, 3-4 times a day. Limit salt in your diet.  Avoid heavy lifting, wear low heel shoes, and practice good posture.  Rest with your legs elevated if you have leg cramps or low back pain.  Visit your dentist if you have not gone yet during your pregnancy. Use a soft toothbrush to brush your teeth and be gentle when you floss.  A sexual relationship may be continued unless your health care provider directs you otherwise.  Continue to go to all your prenatal visits as directed by your health care provider. SEEK MEDICAL CARE IF:   You have dizziness.  You have mild pelvic cramps, pelvic pressure, or nagging pain in the  abdominal area.  You have persistent nausea, vomiting, or diarrhea.  You have a bad smelling vaginal discharge.  You have pain with urination. SEEK IMMEDIATE MEDICAL CARE IF:   You have a fever.  You are leaking fluid from your vagina.  You have spotting or bleeding from your vagina.  You have severe abdominal cramping or pain.  You have rapid weight gain or loss.  You have shortness of breath with chest pain.  You notice sudden or extreme swelling of your face, hands, ankles, feet, or legs.  You have not felt your baby move in over an hour.  You have severe headaches that do not go away with medicine.  You have vision changes.   This information is not intended to replace advice given to you by your health care provider. Make sure you discuss any questions you have with your health care provider.   Document Released: 12/22/2000 Document Revised: 01/18/2014 Document Reviewed: 02/29/2012 Elsevier Interactive Patient Education [employer].

## 2014-11-08 LAB — OBSTETRIC PANEL
ANTIBODY SCREEN: NEGATIVE
BASOS ABS: 0 10*3/uL (ref 0.0–0.1)
Basophils Relative: 0 % (ref 0–1)
EOS PCT: 0 % (ref 0–5)
Eosinophils Absolute: 0 10*3/uL (ref 0.0–0.7)
HCT: 38 % (ref 36.0–46.0)
HEMOGLOBIN: 12.8 g/dL (ref 12.0–15.0)
Hepatitis B Surface Ag: NEGATIVE
LYMPHS PCT: 17 % (ref 12–46)
Lymphs Abs: 1.7 10*3/uL (ref 0.7–4.0)
MCH: 28.3 pg (ref 26.0–34.0)
MCHC: 33.7 g/dL (ref 30.0–36.0)
MCV: 84.1 fL (ref 78.0–100.0)
MPV: 10 fL (ref 8.6–12.4)
Monocytes Absolute: 0.7 10*3/uL (ref 0.1–1.0)
Monocytes Relative: 7 % (ref 3–12)
Neutro Abs: 7.7 10*3/uL (ref 1.7–7.7)
Neutrophils Relative %: 76 % (ref 43–77)
Platelets: 161 10*3/uL (ref 150–400)
RBC: 4.52 MIL/uL (ref 3.87–5.11)
RDW: 15.3 % (ref 11.5–15.5)
Rh Type: POSITIVE
Rubella: 3.03 Index — ABNORMAL HIGH (ref <0.90)
WBC: 10.1 10*3/uL (ref 4.0–10.5)

## 2014-11-08 LAB — PROTEIN / CREATININE RATIO, URINE
Creatinine, Urine: 161 mg/dL (ref 20–320)
PROTEIN CREATININE RATIO: 87 mg/g{creat} (ref 21–161)
TOTAL PROTEIN, URINE: 14 mg/dL (ref 5–24)

## 2014-11-08 LAB — GC/CHLAMYDIA PROBE AMP (~~LOC~~) NOT AT ARMC
Chlamydia: NEGATIVE
Neisseria Gonorrhea: NEGATIVE

## 2014-11-08 LAB — CULTURE, OB URINE: Colony Count: 3000

## 2014-11-09 LAB — PRESCRIPTION MONITORING PROFILE (19 PANEL)
AMPHETAMINE/METH: NEGATIVE ng/mL
Barbiturate Screen, Urine: NEGATIVE ng/mL
Benzodiazepine Screen, Urine: NEGATIVE ng/mL
Buprenorphine, Urine: NEGATIVE ng/mL
CARISOPRODOL, URINE: NEGATIVE ng/mL
Cannabinoid Scrn, Ur: NEGATIVE ng/mL
Cocaine Metabolites: NEGATIVE ng/mL
Creatinine, Urine: 160.27 mg/dL (ref >20.0)
Fentanyl, Ur: NEGATIVE ng/mL
MDMA URINE: NEGATIVE ng/mL
MEPERIDINE UR: NEGATIVE ng/mL
METHADONE SCREEN, URINE: NEGATIVE ng/mL
METHAQUALONE SCREEN (URINE): NEGATIVE ng/mL
Nitrites, Initial: NEGATIVE ug/mL
Opiate Screen, Urine: NEGATIVE ng/mL
Oxycodone Screen, Ur: NEGATIVE ng/mL
Phencyclidine, Ur: NEGATIVE ng/mL
Propoxyphene: NEGATIVE ng/mL
TAPENTADOLUR: NEGATIVE ng/mL
Tramadol Scrn, Ur: NEGATIVE ng/mL
Zolpidem, Urine: NEGATIVE ng/mL
pH, Initial: 6.7 pH (ref 4.5–8.9)

## 2014-11-11 ENCOUNTER — Encounter: Payer: Self-pay | Admitting: Family Medicine

## 2014-11-11 DIAGNOSIS — O0930 Supervision of pregnancy with insufficient antenatal care, unspecified trimester: Secondary | ICD-10-CM | POA: Insufficient documentation

## 2014-11-11 LAB — CYTOLOGY - PAP

## 2014-11-12 ENCOUNTER — Telehealth: Payer: Self-pay | Admitting: *Deleted

## 2014-11-12 NOTE — Telephone Encounter (Signed)
17P arrived. Called [facility] to notify. Left message we are calling with some information, please call clinic. Already has appointment 11/14/14- we can start shots at that appointment.

## 2014-11-13 ENCOUNTER — Other Ambulatory Visit: Payer: Self-pay | Admitting: Family Medicine

## 2014-11-13 ENCOUNTER — Ambulatory Visit (HOSPITAL_COMMUNITY): Payer: Medicaid Other

## 2014-11-13 ENCOUNTER — Ambulatory Visit (HOSPITAL_COMMUNITY)
Admission: RE | Admit: 2014-11-13 | Discharge: 2014-11-13 | Disposition: A | Discharge status: Home / Self Care | Payer: Medicaid Other | Source: Ambulatory Visit | Attending: Obstetrics & Gynecology | Admitting: Obstetrics & Gynecology

## 2014-11-13 DIAGNOSIS — Z3A17 17 weeks gestation of pregnancy: Secondary | ICD-10-CM | POA: Diagnosis not present

## 2014-11-13 DIAGNOSIS — O09892 Supervision of other high risk pregnancies, second trimester: Secondary | ICD-10-CM

## 2014-11-13 DIAGNOSIS — O10019 Pre-existing essential hypertension complicating pregnancy, unspecified trimester: Secondary | ICD-10-CM | POA: Diagnosis not present

## 2014-11-13 DIAGNOSIS — O09212 Supervision of pregnancy with history of pre-term labor, second trimester: Secondary | ICD-10-CM

## 2014-11-13 DIAGNOSIS — O09219 Supervision of pregnancy with history of pre-term labor, unspecified trimester: Secondary | ICD-10-CM | POA: Diagnosis not present

## 2014-11-13 DIAGNOSIS — O3432 Maternal care for cervical incompetence, second trimester: Secondary | ICD-10-CM

## 2014-11-13 DIAGNOSIS — Z36 Encounter for antenatal screening of mother: Secondary | ICD-10-CM | POA: Insufficient documentation

## 2014-11-13 NOTE — Telephone Encounter (Signed)
[facility] , she answered but then phone call was disconnected.  Called back and no answer. Will start shots at her appointment tomorrow.

## 2014-11-14 ENCOUNTER — Ambulatory Visit (INDEPENDENT_AMBULATORY_CARE_PROVIDER_SITE_OTHER): Payer: Medicaid Other | Admitting: Obstetrics & Gynecology

## 2014-11-14 VITALS — BP 136/69 | HR 90 | Wt 131.8 lb

## 2014-11-14 DIAGNOSIS — O09212 Supervision of pregnancy with history of pre-term labor, second trimester: Secondary | ICD-10-CM | POA: Diagnosis present

## 2014-11-14 DIAGNOSIS — O09892 Supervision of other high risk pregnancies, second trimester: Secondary | ICD-10-CM

## 2014-11-14 LAB — POCT URINALYSIS DIP (DEVICE)
BILIRUBIN URINE: NEGATIVE
Glucose, UA: NEGATIVE mg/dL
HGB URINE DIPSTICK: NEGATIVE
Ketones, ur: NEGATIVE mg/dL
Nitrite: NEGATIVE
PH: 7 (ref 5.0–8.0)
PROTEIN: NEGATIVE mg/dL
Specific Gravity, Urine: 1.025 (ref 1.005–1.030)
Urobilinogen, UA: 0.2 mg/dL (ref 0.0–1.0)

## 2014-11-14 MED ORDER — HYDROXYPROGESTERONE CAPROATE 250 MG/ML IM OIL
250.0000 mg | TOPICAL_OIL | INTRAMUSCULAR | Status: DC
  Administered 2014-11-14 – 2015-03-13 (×16): 250 mg via INTRAMUSCULAR

## 2014-11-14 NOTE — Progress Notes (Signed)
Subjective:  Margaret Keller is a 19 y.o. G3P0200 at [redacted]w[redacted]d being seen today for ongoing prenatal care.  [name]  Contractions: Not present.  Vag. Bleeding: None. Movement: Present. Denies leaking of fluid.   [name], current medications, past family history, past medical history, past social history, past surgical history and problem list. Problem list updated.  Objective:   Filed Vitals:   11/14/14 0859  BP: 136/69  Pulse: 90  Weight: 131 lb 12.8 oz (59.784 kg)    Fetal Status: Fetal Heart Rate (bpm): 140   Movement: Present     General:  Alert, oriented and cooperative. Patient is in no acute distress.  Skin: Skin is warm and dry. No rash noted.   Cardiovascular: Normal heart rate noted  Respiratory: Normal respiratory effort, no problems with respiration noted  Abdomen: Soft, gravid, appropriate for gestational age. Pain/Pressure: Present     Pelvic: Vag. Bleeding: None     Cervical exam deferred        Extremities: Normal range of motion.  Edema: None  Mental Status: Normal mood and affect. Normal behavior. Normal judgment and thought content.   Urinalysis: Urine Protein: Negative Urine Glucose: Negative  11/11/14 3 cm cervical length  Assessment and Plan:  Pregnancy: G3P0200 at [redacted]w[redacted]d  1. History of preterm delivery, currently pregnant, second trimester Recommended cerclage.  Patient agreed to this; procedure scheduled on 11/19/14.  Will have follow up scan on 11/20/14; already scheduled. 17P started today. - hydroxyprogesterone caproate (DELALUTIN) 250 mg/mL injection 250 mg; Inject 1 mL (250 mg total) into the muscle once a week. - AFP, Quad Screen Routine obstetric precautions reviewed.  Of note, patient had isolated elevated SBP last visit, no history of BP issues.  Normal BP this week. Will continue to follow. Please refer to After Visit Summary for other  counseling recommendations.  Return in about 2 weeks (around 11/28/2014) for OB Visit.; weekly 17P.   Nakhia Levitan A Earle Troiano, MD    

## 2014-11-14 NOTE — Patient Instructions (Addendum)
Procedure is on 11/19/14 at 1:30 pm Nothing to eat or drink for 8 hours before surgery Come to Main Entrance of hospital at 12 noon   Cervical Cerclage Cervical cerclage is a surgical procedure for an incompetent cervix. An incompetent cervix is a weak cervix that opens up before labor begins. Cervical cerclage is a procedure in which the cervix is sewn closed during pregnancy.  [facility] PROVID[facility] KNOW ABOUT:   Any allergies you have.  All medicines you are taking, including vitamins, herbs, eye drops, creams, and over-the-counter medicines.  Previous problems you or members of your family have had with the use of anesthetics.  Any blood disorders you have.  Previous surgeries you have had.  Medical conditions you have.  Any recent colds or infections. RISKS AND COMPLICATIONS  Generally, this is a safe procedure. However, as with any procedure, problems can occur. Possible problems include:  Infection.  Bleeding.  Rupturing the amniotic sac (membranes).  Going into early labor and delivery.  Problems with the anesthetics.  Infection of the amniotic sac. BEFORE THE PROCEDURE   Ask your health care provider about changing or stopping your medicines.  Do not eat or drink anything for 6-8 hours before the procedure.  Arrange for someone to drive you home after the procedure. PROCEDURE   An IV tube will be placed in your vein. You will be given a sedative to help you relax.  You will be given a medicine that makes you sleep through the procedure (general anesthetic) or a medicine injected into your spine that numbs your body below the waist (spinal or epidural anesthetic). You will be asleep or be numbed through the entire procedure.  A speculum will be placed in your vagina to visualize your cervix.  The cervix is then grasped and stitched closed tightly.  Ultrasound may be used to guide the procedure and monitor the baby. AFTER THE PROCEDURE   You will  go to a recovery room where you and your unborn baby are monitored. Once you are awake, stable, and taking fluids well, you will be allowed to return to your room.  You may get an injection of progesterone to prevent uterine contractions.  You may be given pain-relieving medicines to take with you when you go home.  Have someone drive you home and stay with you for up to 2 days.   This information is not intended to replace advice given to you by your health care provider. Make sure you discuss any questions you have with your health care provider.   Document Released: 12/11/2007 Document Revised: 01/02/2013 Document Reviewed: 07/19/2012 Elsevier Interactive Patient Education [employer] Elsevier Inc.

## 2014-11-15 ENCOUNTER — Encounter (HOSPITAL_COMMUNITY): Payer: Self-pay

## 2014-11-15 LAB — AFP, QUAD SCREEN
AFP: 44.2 ng/mL
Age Alone: 1:1150 {titer}
Curr Gest Age: 17.3 wks.days
HCG, Total: 24.95 IU/mL
INH: 168.5 pg/mL
Interpretation-AFP: NEGATIVE
MOM FOR INH: 0.93
MoM for AFP: 1.04
MoM for hCG: 0.77
OPEN SPINA BIFIDA: NEGATIVE
Tri 18 Scr Risk Est: NEGATIVE
Trisomy 18 (Edward) Syndrome Interp.: 1:68000 {titer}
UE3 MOM: 1.08
uE3 Value: 1.27 ng/mL

## 2014-11-18 MED ORDER — CITRIC ACID-SODIUM CITRATE 334-500 MG/5ML PO SOLN: 30.0000 mL | ORAL | Status: DC

## 2014-11-18 MED ORDER — INDOMETHACIN 50 MG RE SUPP
100.0000 mg | Freq: Once | RECTAL | Status: DC
  Filled 2014-11-18: qty 2

## 2014-11-19 ENCOUNTER — Ambulatory Visit (HOSPITAL_COMMUNITY): Payer: Medicaid Other | Admitting: Anesthesiology

## 2014-11-19 ENCOUNTER — Ambulatory Visit (HOSPITAL_COMMUNITY)
Admission: RE | Admit: 2014-11-19 | Discharge: 2014-11-19 | Disposition: A | Discharge status: Home / Self Care | Payer: Medicaid Other | Source: Ambulatory Visit | Attending: Obstetrics & Gynecology | Admitting: Obstetrics & Gynecology

## 2014-11-19 ENCOUNTER — Encounter (HOSPITAL_COMMUNITY)
Admission: RE | Disposition: A | Discharge status: Home / Self Care | Payer: Self-pay | Source: Ambulatory Visit | Attending: Obstetrics & Gynecology

## 2014-11-19 ENCOUNTER — Encounter (HOSPITAL_COMMUNITY): Payer: Self-pay

## 2014-11-19 DIAGNOSIS — O3432 Maternal care for cervical incompetence, second trimester: Secondary | ICD-10-CM

## 2014-11-19 DIAGNOSIS — O09899 Supervision of other high risk pregnancies, unspecified trimester: Secondary | ICD-10-CM

## 2014-11-19 DIAGNOSIS — Z3A18 18 weeks gestation of pregnancy: Secondary | ICD-10-CM | POA: Diagnosis not present

## 2014-11-19 DIAGNOSIS — O09219 Supervision of pregnancy with history of pre-term labor, unspecified trimester: Secondary | ICD-10-CM

## 2014-11-19 HISTORY — PX: CERVICAL CERCLAGE: SHX1329

## 2014-11-19 LAB — CBC
HEMATOCRIT: 36.4 % (ref 36.0–46.0)
HEMOGLOBIN: 12.4 g/dL (ref 12.0–15.0)
MCH: 28.9 pg (ref 26.0–34.0)
MCHC: 34.1 g/dL (ref 30.0–36.0)
MCV: 84.8 fL (ref 78.0–100.0)
Platelets: 149 10*3/uL — ABNORMAL LOW (ref 150–400)
RBC: 4.29 MIL/uL (ref 3.87–5.11)
RDW: 14.6 % (ref 11.5–15.5)
WBC: 11.3 10*3/uL — AB (ref 4.0–10.5)

## 2014-11-19 SURGERY — CERCLAGE, CERVIX, VAGINAL APPROACH
Anesthesia: Spinal | Site: Vagina

## 2014-11-19 MED ORDER — BUPIVACAINE HCL (PF) 0.5 % IJ SOLN
INTRAMUSCULAR | Status: AC
  Filled 2014-11-19: qty 30

## 2014-11-19 MED ORDER — OXYCODONE-ACETAMINOPHEN 5-325 MG PO TABS: 1.0000 | ORAL_TABLET | Freq: Four times a day (QID) | ORAL | Status: DC | PRN

## 2014-11-19 MED ORDER — EPHEDRINE SULFATE 50 MG/ML IJ SOLN
INTRAMUSCULAR | Status: DC | PRN
  Administered 2014-11-19: 10 mg via INTRAVENOUS

## 2014-11-19 MED ORDER — BUPIVACAINE IN DEXTROSE 0.75-8.25 % IT SOLN
INTRATHECAL | Status: DC | PRN
  Administered 2014-11-19: 9 mg via INTRATHECAL

## 2014-11-19 MED ORDER — DOCUSATE SODIUM 100 MG PO CAPS: 100.0000 mg | ORAL_CAPSULE | Freq: Two times a day (BID) | ORAL | Status: DC | PRN

## 2014-11-19 MED ORDER — EPHEDRINE 5 MG/ML INJ
INTRAVENOUS | Status: AC
  Filled 2014-11-19: qty 10

## 2014-11-19 MED ORDER — INDOMETHACIN 50 MG RE SUPP
RECTAL | Status: DC | PRN
  Administered 2014-11-19: 100 mg via RECTAL

## 2014-11-19 MED ORDER — BUPIVACAINE HCL (PF) 0.5 % IJ SOLN
INTRAMUSCULAR | Status: DC | PRN
  Administered 2014-11-19: 30 mL

## 2014-11-19 MED ORDER — LACTATED RINGERS IV SOLN
INTRAVENOUS | Status: DC
  Administered 2014-11-19: 125 mL/h via INTRAVENOUS

## 2014-11-19 SURGICAL SUPPLY — 20 items
CLOTH BEACON ORANGE TIMEOUT ST (SAFETY) ×3 IMPLANT
COUNTER NEEDLE 1200 MAGNETIC (NEEDLE) IMPLANT
ELECT REM PT RETURN 9FT ADLT (ELECTROSURGICAL)
ELECTRODE REM PT RTRN 9FT ADLT (ELECTROSURGICAL) IMPLANT
GLOVE BIOGEL PI IND STRL 7.0 (GLOVE) ×2 IMPLANT
GLOVE BIOGEL PI INDICATOR 7.0 (GLOVE) ×4
GLOVE ECLIPSE 7.0 STRL STRAW (GLOVE) ×3 IMPLANT
GOWN STRL REUS W/TWL LRG LVL3 (GOWN DISPOSABLE) ×6 IMPLANT
PACK VAGINAL MINOR WOMEN LF (CUSTOM PROCEDURE TRAY) ×3 IMPLANT
PAD OB MATERNITY 4.3X12.25 (PERSONAL CARE ITEMS) ×3 IMPLANT
PAD PREP 24X48 CUFFED NSTRL (MISCELLANEOUS) ×3 IMPLANT
PENCIL BUTTON HOLSTER BLD 10FT (ELECTRODE) IMPLANT
SUT PROLENE 1 CT 1 30 (SUTURE) ×3 IMPLANT
SYR BULB IRRIGATION 50ML (SYRINGE) IMPLANT
TOWEL OR 17X24 6PK STRL BLUE (TOWEL DISPOSABLE) ×6 IMPLANT
TRAY FOLEY CATH SILVER 14FR (SET/KITS/TRAYS/PACK) ×3 IMPLANT
TUBING NON-CON 1/4 X 20 CONN (TUBING) IMPLANT
TUBING NON-CON 1/4 X 20' CONN (TUBING)
WATER STERILE IRR 1000ML POUR (IV SOLUTION) ×3 IMPLANT
YANKAUER SUCT BULB TIP NO VENT (SUCTIONS) IMPLANT

## 2014-11-19 NOTE — Op Note (Signed)
Margaret Keller   PROCEDURE DATE: 11/19/2014  PREOPERATIVE DIAGNOSIS: Intrauterine pregnancy at 3580w1d, history of cervical incompetence with two second trimester losses   POSTOPERATIVE DIAGNOSIS: The same PROCEDURE: Transvaginal McDonald Cervical Cerclage Placement SURGEON:  Dr. Jaynie CollinsUgonna Vonnetta Akey  INDICATIONS: 19 y.o. G3P0200 at 5780w1d with history of cervical incompetence, here for cerclage placement.   The risks of surgery were discussed in detail with the patient including but not limited to: bleeding; infection which may require antibiotic therapy; injury to cervix, vagina other surrounding organs; risk of ruptured membranes and/or preterm delivery and other postoperative or anesthesia complications.  Written informed consent was obtained.    FINDINGS:  About 2 cm palpable cervical length in the vagina, closed cervix, suture knot placed anteriorly.  ANESTHESIA:  Spinal INTRAVENOUS FLUIDS: 900  ml ESTIMATED BLOOD LOSS: 15 ml COMPLICATIONS: None immediate  PROCEDURE IN DETAIL:  The patient received intravenous antibiotics and had sequential compression devices applied to her lower extremities while in the preoperative area.  Reassuring fetal heart rate was also obtained using a doppler. She was then taken to the operating room where spinal anesthesia was administered and was found to be adequate.  She was placed in the dorsal lithotomy, and was prepped and draped in a sterile manner. Her bladder was catheterized for an unmeasured amount of clear, yellow urine.    After an adequate timeout was performed, a vaginal speculum was then placed in the patient's vagina and a single tooth tenaculum was applied to the anterior lip of the cervix.  A paracervical block using 0.5 % Marcaine was administered.  The anterior and posterior lips of the cervix was grasped with ring forceps. A curved needle loaded with a 5 mm Prolene suture was inserted at 12 o'clock, as high as possible at the junction of the rugated  vaginal epithelium and the smooth cervix, at least 1.5 cm above the external os.  Four bites are taken circumferentially around the entire cervix in a purse-string fashion, each bite should be deep enough to extend at least midway into the cervical stroma, but not into the endocervical canal. The two ends of the suture were then tied securely anteriorly and cut, leaving the ends long enough to grasp with a clamp when it is time to remove it. There was minimal bleeding noted and the ring forceps were removed with good hemostasis noted.  All instruments were removed from the patient's vagina. Indomethacin 100 mg rectal suppository was placed. Instrument, needle and sponge counts were correct x 2. The patient tolerated the procedure well, and was taken to the recovery area awake and in stable condition.  Reassuring fetal heart rate was also obtained using a doppler in the recovery area.  The patient will be discharged to home as per PACU criteria.  Routine postoperative instructions given.  She was prescribed Percocet and Colace.  She will follow up in the clinic for ongoing prenatal care.   Jaynie CollinsUGONNA  [name], MD, FACOG Attending Obstetrician & Gynecologist Faculty Practice, [facility] [place]

## 2014-11-19 NOTE — H&P (View-Only) (Signed)
Subjective:  Margaret Keller is a 19 y.o. G3P0200 at 1248w3d being seen today for ongoing prenatal care.  [name]  Contractions: Not present.  Vag. Bleeding: None. Movement: Present. Denies leaking of fluid.   [name], current medications, past family history, past medical history, past social history, past surgical history and problem list. Problem list updated.  Objective:   Filed Vitals:   11/14/14 0859  BP: 136/69  Pulse: 90  Weight: 131 lb 12.8 oz (59.784 kg)    Fetal Status: Fetal Heart Rate (bpm): 140   Movement: Present     General:  Alert, oriented and cooperative. Patient is in no acute distress.  Skin: Skin is warm and dry. No rash noted.   Cardiovascular: Normal heart rate noted  Respiratory: Normal respiratory effort, no problems with respiration noted  Abdomen: Soft, gravid, appropriate for gestational age. Pain/Pressure: Present     Pelvic: Vag. Bleeding: None     Cervical exam deferred        Extremities: Normal range of motion.  Edema: None  Mental Status: Normal mood and affect. Normal behavior. Normal judgment and thought content.   Urinalysis: Urine Protein: Negative Urine Glucose: Negative  11/11/14 3 cm cervical length  Assessment and Plan:  Pregnancy: G3P0200 at 7348w3d  1. History of preterm delivery, currently pregnant, second trimester Recommended cerclage.  Patient agreed to this; procedure scheduled on 11/19/14.  Will have follow up scan on 11/20/14; already scheduled. 17P started today. - hydroxyprogesterone caproate (DELALUTIN) 250 mg/mL injection 250 mg; Inject 1 mL (250 mg total) into the muscle once a week. - AFP, Quad Screen Routine obstetric precautions reviewed.  Of note, patient had isolated elevated SBP last visit, no history of BP issues.  Normal BP this week. Will continue to follow. Please refer to After Visit Summary for other  counseling recommendations.  Return in about 2 weeks (around 11/28/2014) for OB Visit.; weekly 17P.   Tereso NewcomerUgonna A Eric Morganti, MD

## 2014-11-19 NOTE — Anesthesia Procedure Notes (Signed)
Spinal Patient location during procedure: OR Start time: 11/19/2014 1:01 PM End time: 11/19/2014 1:10 PM Staffing Anesthesiologist: Heather RobertsSINGER, Margaret Reinig Performed by: anesthesiologist  Preanesthetic Checklist Completed: patient identified, surgical consent, pre-op evaluation, timeout performed, IV checked, risks and benefits discussed and monitors and equipment checked Spinal Block Patient position: sitting Prep: DuraPrep Patient monitoring: cardiac monitor, continuous pulse ox and blood pressure Approach: midline Location: L3-4 Injection technique: single-shot Needle Needle type: Pencan  Needle gauge: 24 G Needle length: 9 cm Additional Notes Functioning IV was confirmed and monitors were applied. Sterile prep and drape, including hand hygiene and sterile gloves were used. The patient was positioned and the spine was prepped. The skin was anesthetized with lidocaine.  Free flow of clear CSF was obtained prior to injecting local anesthetic into the CSF.  The spinal needle aspirated freely following injection.  The needle was carefully withdrawn.  The patient tolerated the procedure well.

## 2014-11-19 NOTE — Discharge Instructions (Signed)
Cervical Cerclage, Care After °Refer to this sheet in the next few weeks. These instructions provide you with information on caring for yourself after your procedure. Your health care provider may also give you more specific instructions. Your treatment has been planned according to current medical practices, but problems sometimes occur. Call your health care provider if you have any problems or questions after your procedure. °WHAT TO EXPECT AFTER THE PROCEDURE  °After your procedure, it is typical to have the following: °· Abdominal cramping. °· Vaginal spotting. °HOME CARE INSTRUCTIONS  °· Only take over-the-counter or prescription medicines for pain, discomfort, or fever as directed by your health care provider. °· Avoid physical activities and exercise until your health care provider says it is okay. °· Do not douche or have sexual intercourse until your health care provider tells you it is okay. °· Keep your follow-up surgical and prenatal appointments with your health care provider. °SEEK MEDICAL CARE IF:  °· You have abnormal vaginal discharge. °· You have a rash. °· You become lightheaded or feel faint. °· You have abdominal pain that is not controlled with pain medicine. °SEEK IMMEDIATE MEDICAL CARE IF:  °· You develop vaginal bleeding. °· You are leaking fluid or have a gush of fluid from the vagina. °· You have a fever. °· You faint. °· You have uterine contractions. °· You feel your baby is not moving as much as usual, or you cannot feel your baby move. °· You have chest pain or shortness of breath. °  °This information is not intended to replace advice given to you by your health care provider. Make sure you discuss any questions you have with your health care provider. °  °Document Released: 10/18/2012 Document Revised: 01/02/2013 Document Reviewed: 10/18/2012 °Elsevier Interactive Patient Education ©2016 Elsevier Inc. ° °

## 2014-11-19 NOTE — Anesthesia Preprocedure Evaluation (Addendum)
Anesthesia Evaluation  Patient identified by MRN, date of birth, ID band Patient awake    Reviewed: Allergy & Precautions, NPO status , Patient's Chart, lab work & pertinent test results  History of Anesthesia Complications Negative for: history of anesthetic complications  Airway Mallampati: II  TM Distance: >3 FB Neck ROM: Full    Dental  (+) Teeth Intact, Dental Advisory Given   Pulmonary neg pulmonary ROS,    Pulmonary exam normal        Cardiovascular negative cardio ROS Normal cardiovascular exam     Neuro/Psych negative neurological ROS  negative psych ROS   GI/Hepatic negative GI ROS, Neg liver ROS,   Endo/Other  negative endocrine ROS  Renal/GU negative Renal ROS     Musculoskeletal   Abdominal   Peds  Hematology   Anesthesia Other Findings   Reproductive/Obstetrics                            Anesthesia Physical Anesthesia Plan  ASA: II  Anesthesia Plan: Spinal   Post-op Pain Management:    Induction: Intravenous  Airway Management Planned: Simple Face Mask  Additional Equipment:   Intra-op Plan:   Post-operative Plan:   Informed Consent: I have reviewed the patients History and Physical, chart, labs and discussed the procedure including the risks, benefits and alternatives for the proposed anesthesia with the patient or authorized representative who has indicated his/her understanding and acceptance.   Dental advisory given  Plan Discussed with: CRNA, Anesthesiologist and Surgeon  Anesthesia Plan Comments:        Anesthesia Quick Evaluation

## 2014-11-19 NOTE — Anesthesia Postprocedure Evaluation (Signed)
  Anesthesia Post-op Note  Patient: Margaret Keller  Procedure(s) Performed: Procedure(s): CERCLAGE CERVICAL (N/A)  Patient Location: PACU  Anesthesia Type:Spinal  Level of Consciousness: awake, alert  and oriented  Airway and Oxygen Therapy: Patient Spontanous Breathing  Post-op Pain: none  Post-op Assessment: Post-op Vital signs reviewed, Patient's Cardiovascular Status Stable, Respiratory Function Stable, Patent Airway, No signs of Nausea or vomiting, Pain level controlled, No headache, No backache, Spinal receding and Patient able to bend at knees              Post-op Vital Signs: Reviewed and stable  Last Vitals:  Filed Vitals:   11/19/14 1515  BP: 134/71  Pulse: 89  Temp:   Resp: 22    Complications: No apparent anesthesia complications

## 2014-11-19 NOTE — Transfer of Care (Signed)
Immediate Anesthesia Transfer of Care Note  Patient: Margaret Keller  Procedure(s) Performed: Procedure(s): CERCLAGE CERVICAL (N/A)  Patient Location: PACU  Anesthesia Type:Spinal  Level of Consciousness: awake, alert , oriented and patient cooperative  Airway & Oxygen Therapy: Patient Spontanous Breathing  Post-op Assessment: Report given to RN and Post -op Vital signs reviewed and stable  Post vital signs: Reviewed and stable  Last Vitals:  Filed Vitals:   11/19/14 1218  BP: 120/68  Pulse: 81  Temp: 36.9 C    Complications: No apparent anesthesia complications

## 2014-11-19 NOTE — Interval H&P Note (Signed)
History and Physical Interval Note 11/19/2014 12:39 PM  Margaret Keller is a 19 y.o. G3P0200 at 9273w1d who has presented today for CERVICAL CERCLAGE surgery, with the diagnosis of history of cervical incompetence with second trimester loss x 2.  The various methods of treatment have been discussed with the patient and family.  The risks of surgery were discussed in detail with the patient including but not limited to: bleeding; infection which may require antibiotic therapy; injury to cervix, vagina other surrounding organs; risk of ruptured membranes and/or preterm delivery and other postoperative or anesthesia complications.  After consideration of risks, benefits and other options for treatment, the patient has consented to CERVICAL CERCLAGE as a surgical intervention .  The patient's history has been reviewed, patient examined, no change in status, stable for surgery.  FHR 150s.  I have reviewed the patient's chart and labs.  Questions were answered to the patient's satisfaction.  To OR when ready.    Jaynie CollinsUGONNA  Shamiya Demeritt, MD, FACOG Attending Obstetrician & Gynecologist, [facility], [facility]

## 2014-11-20 ENCOUNTER — Ambulatory Visit (HOSPITAL_COMMUNITY)
Admission: RE | Admit: 2014-11-20 | Discharge: 2014-11-20 | Disposition: A | Discharge status: Home / Self Care | Payer: Medicaid Other | Source: Ambulatory Visit | Attending: Obstetrics & Gynecology | Admitting: Obstetrics & Gynecology

## 2014-11-20 ENCOUNTER — Encounter (HOSPITAL_COMMUNITY): Payer: Self-pay | Admitting: Obstetrics & Gynecology

## 2014-11-20 VITALS — BP 133/69 | HR 96 | Wt 132.2 lb

## 2014-11-20 DIAGNOSIS — O3432 Maternal care for cervical incompetence, second trimester: Secondary | ICD-10-CM | POA: Diagnosis not present

## 2014-11-20 DIAGNOSIS — Z3A18 18 weeks gestation of pregnancy: Secondary | ICD-10-CM | POA: Diagnosis not present

## 2014-11-20 DIAGNOSIS — O09219 Supervision of pregnancy with history of pre-term labor, unspecified trimester: Secondary | ICD-10-CM | POA: Diagnosis not present

## 2014-11-20 DIAGNOSIS — O10019 Pre-existing essential hypertension complicating pregnancy, unspecified trimester: Secondary | ICD-10-CM | POA: Diagnosis not present

## 2014-11-20 DIAGNOSIS — O09212 Supervision of pregnancy with history of pre-term labor, second trimester: Secondary | ICD-10-CM

## 2014-11-20 DIAGNOSIS — O09892 Supervision of other high risk pregnancies, second trimester: Secondary | ICD-10-CM

## 2014-11-21 ENCOUNTER — Ambulatory Visit (INDEPENDENT_AMBULATORY_CARE_PROVIDER_SITE_OTHER): Payer: Medicaid Other

## 2014-11-21 VITALS — BP 123/78 | HR 82 | Wt 131.8 lb

## 2014-11-21 DIAGNOSIS — O0932 Supervision of pregnancy with insufficient antenatal care, second trimester: Secondary | ICD-10-CM

## 2014-11-21 DIAGNOSIS — O09212 Supervision of pregnancy with history of pre-term labor, second trimester: Secondary | ICD-10-CM | POA: Diagnosis present

## 2014-11-28 ENCOUNTER — Ambulatory Visit (INDEPENDENT_AMBULATORY_CARE_PROVIDER_SITE_OTHER): Payer: Medicaid Other | Admitting: Obstetrics & Gynecology

## 2014-11-28 VITALS — BP 130/72 | HR 88 | Temp 98.5°F | Wt 134.5 lb

## 2014-11-28 DIAGNOSIS — O09892 Supervision of other high risk pregnancies, second trimester: Secondary | ICD-10-CM

## 2014-11-28 DIAGNOSIS — O3432 Maternal care for cervical incompetence, second trimester: Secondary | ICD-10-CM | POA: Diagnosis not present

## 2014-11-28 DIAGNOSIS — O09212 Supervision of pregnancy with history of pre-term labor, second trimester: Secondary | ICD-10-CM

## 2014-11-28 DIAGNOSIS — O3430 Maternal care for cervical incompetence, unspecified trimester: Secondary | ICD-10-CM

## 2014-11-28 LAB — POCT URINALYSIS DIP (DEVICE)
Bilirubin Urine: NEGATIVE
Glucose, UA: NEGATIVE mg/dL
HGB URINE DIPSTICK: NEGATIVE
Ketones, ur: NEGATIVE mg/dL
NITRITE: NEGATIVE
PH: 7 (ref 5.0–8.0)
PROTEIN: NEGATIVE mg/dL
Specific Gravity, Urine: 1.02 (ref 1.005–1.030)
UROBILINOGEN UA: 0.2 mg/dL (ref 0.0–1.0)

## 2014-11-28 NOTE — Progress Notes (Signed)
Subjective:  Margaret Keller is a 19 y.o. G3P0200 at 1164w3d being seen today for ongoing prenatal care.  Patient reports no complaints; no complications after cerclage placement on 11/19/14.  Contractions: Not present.  Vag. Bleeding: None. Movement: Present. Denies leaking of fluid.   [name], current medications, past family history, past medical history, past social history, past surgical history and problem list. Problem list updated.  Objective:   Filed Vitals:   11/28/14 0954  BP: 130/72  Pulse: 88  Temp: 98.5 F (36.9 C)  Weight: 134 lb 8 oz (61.009 kg)    Fetal Status: Fetal Heart Rate (bpm): 141 Fundal Height: 19 cm Movement: Present     General:  Alert, oriented and cooperative. Patient is in no acute distress.  Skin: Skin is warm and dry. No rash noted.   Cardiovascular: Normal heart rate noted  Respiratory: Normal respiratory effort, no problems with respiration noted  Abdomen: Soft, gravid, appropriate for gestational age. Pain/Pressure: Absent     Pelvic: Vag. Bleeding: None    Cervical exam deferred        Extremities: Normal range of motion.  Edema: None  Mental Status: Normal mood and affect. Normal behavior. Normal judgment and thought content.   Urinalysis: Urine Protein: Negative Urine Glucose: Negative  Assessment and Plan:  Pregnancy: G3P0200 at 4364w3d  1. Cervical insufficiency during pregnancy, antepartum, second trimester Continue MFM cervical length scans 6674w2d- CL3.9 cm , no funneling, cerclage in place  2. Cervical cerclage suture present (placed 11/19/14), antepartum  3. History of preterm delivery, currently pregnant, second trimester Continue weekly 17P  Preterm labor symptoms and general obstetric precautions including but not limited to vaginal bleeding, contractions, leaking of fluid and fetal movement were reviewed in detail with the patient. Please  refer to After Visit Summary for other counseling recommendations.  Return for Weekly 17P injections; then OB visit and 17P in 4 weeks.   Tereso NewcomerUgonna A Moua Rasmusson, MD

## 2014-11-28 NOTE — Progress Notes (Signed)
Reviewed tip of week with patient  

## 2014-11-28 NOTE — Patient Instructions (Signed)
Return to clinic for any obstetric concerns or go to MAU for evaluation  

## 2014-12-04 ENCOUNTER — Other Ambulatory Visit (HOSPITAL_COMMUNITY): Payer: Self-pay | Admitting: Maternal and Fetal Medicine

## 2014-12-04 ENCOUNTER — Encounter (HOSPITAL_COMMUNITY): Payer: Self-pay

## 2014-12-04 ENCOUNTER — Ambulatory Visit (HOSPITAL_COMMUNITY)
Admission: RE | Admit: 2014-12-04 | Discharge: 2014-12-04 | Disposition: A | Discharge status: Home / Self Care | Payer: Medicaid Other | Source: Ambulatory Visit | Attending: Obstetrics & Gynecology | Admitting: Obstetrics & Gynecology

## 2014-12-04 ENCOUNTER — Ambulatory Visit (INDEPENDENT_AMBULATORY_CARE_PROVIDER_SITE_OTHER): Payer: Medicaid Other

## 2014-12-04 VITALS — BP 137/68 | HR 72 | Wt 135.6 lb

## 2014-12-04 DIAGNOSIS — O10012 Pre-existing essential hypertension complicating pregnancy, second trimester: Secondary | ICD-10-CM | POA: Insufficient documentation

## 2014-12-04 DIAGNOSIS — O3432 Maternal care for cervical incompetence, second trimester: Secondary | ICD-10-CM | POA: Diagnosis not present

## 2014-12-04 DIAGNOSIS — O09212 Supervision of pregnancy with history of pre-term labor, second trimester: Secondary | ICD-10-CM | POA: Diagnosis present

## 2014-12-04 DIAGNOSIS — O09892 Supervision of other high risk pregnancies, second trimester: Secondary | ICD-10-CM

## 2014-12-04 DIAGNOSIS — O10919 Unspecified pre-existing hypertension complicating pregnancy, unspecified trimester: Secondary | ICD-10-CM

## 2014-12-04 DIAGNOSIS — Z3A20 20 weeks gestation of pregnancy: Secondary | ICD-10-CM

## 2014-12-12 ENCOUNTER — Ambulatory Visit: Payer: Medicaid Other | Admitting: *Deleted

## 2014-12-12 VITALS — BP 124/71 | HR 90

## 2014-12-12 DIAGNOSIS — O09892 Supervision of other high risk pregnancies, second trimester: Secondary | ICD-10-CM

## 2014-12-12 DIAGNOSIS — O09212 Supervision of pregnancy with history of pre-term labor, second trimester: Principal | ICD-10-CM

## 2014-12-12 NOTE — Progress Notes (Signed)
Here for her Makena injection. No makena available. Informed Margaret Keller we do not have her next injection available but I will call for it. I instructed her to call Monday to see if it has arrived.  Called Makena to find out which compounding pharmacy is providing her makena. Connected to [facility] ph [phone]. Requested next dose sent out asap.

## 2014-12-19 ENCOUNTER — Ambulatory Visit (INDEPENDENT_AMBULATORY_CARE_PROVIDER_SITE_OTHER): Payer: Medicaid Other | Admitting: *Deleted

## 2014-12-19 DIAGNOSIS — O09892 Supervision of other high risk pregnancies, second trimester: Secondary | ICD-10-CM

## 2014-12-19 DIAGNOSIS — O09212 Supervision of pregnancy with history of pre-term labor, second trimester: Secondary | ICD-10-CM

## 2014-12-26 ENCOUNTER — Ambulatory Visit (INDEPENDENT_AMBULATORY_CARE_PROVIDER_SITE_OTHER): Payer: Medicaid Other | Admitting: Obstetrics & Gynecology

## 2014-12-26 VITALS — BP 128/71 | HR 92 | Temp 98.7°F | Wt 142.0 lb

## 2014-12-26 DIAGNOSIS — O358XX0 Maternal care for other (suspected) fetal abnormality and damage, not applicable or unspecified: Secondary | ICD-10-CM

## 2014-12-26 DIAGNOSIS — O3430 Maternal care for cervical incompetence, unspecified trimester: Secondary | ICD-10-CM

## 2014-12-26 DIAGNOSIS — O09212 Supervision of pregnancy with history of pre-term labor, second trimester: Secondary | ICD-10-CM

## 2014-12-26 DIAGNOSIS — O35BXX0 Maternal care for other (suspected) fetal abnormality and damage, fetal cardiac anomalies, not applicable or unspecified: Secondary | ICD-10-CM

## 2014-12-26 DIAGNOSIS — O3432 Maternal care for cervical incompetence, second trimester: Secondary | ICD-10-CM | POA: Diagnosis not present

## 2014-12-26 DIAGNOSIS — O09892 Supervision of other high risk pregnancies, second trimester: Secondary | ICD-10-CM

## 2014-12-26 DIAGNOSIS — O0992 Supervision of high risk pregnancy, unspecified, second trimester: Secondary | ICD-10-CM

## 2014-12-26 LAB — POCT URINALYSIS DIP (DEVICE)
Bilirubin Urine: NEGATIVE
GLUCOSE, UA: NEGATIVE mg/dL
Hgb urine dipstick: NEGATIVE
Ketones, ur: NEGATIVE mg/dL
NITRITE: NEGATIVE
Protein, ur: NEGATIVE mg/dL
Specific Gravity, Urine: 1.025 (ref 1.005–1.030)
UROBILINOGEN UA: 0.2 mg/dL (ref 0.0–1.0)
pH: 6.5 (ref 5.0–8.0)

## 2014-12-26 NOTE — Patient Instructions (Addendum)
Return to clinic for any obstetric concerns or go to MAU for evaluation AREA PEDIATRIC/FAMILY PRACTICE PHYSICIANS  [facility] [address] [place], [place] [address] Phone - [phone]   Fax - [phone]  [facility] [address] [place], [place]  [address] Phone - [phone]   Fax - [phone]  [facility] [address] [place], [place]  [address] Phone - [phone]   Fax - [phone]  [facility] [address] [place], [place]  [address] Phone - [phone]   Fax - [phone]  [facility] [address] [place], [place]  [address] Phone - [phone]   Fax - [phone]  [facility] [address] [place], [place]  [address] Phone - [phone]   Fax - [phone]  [facility] [address] [place], [place]  [address] Phone - [phone]   Fax - [phone]  [facility] [address] [place], [place]  [address] Phone - [phone]   Fax - [phone]  [facility] [address] [place], [place]  [address] Phone - [phone]   Fax - [phone] [facility] [address] [place], [place]  [address] Phone - [phone]   Fax - [phone]  [facility] [address] [place], [place]  [address] Phone - [phone]   Fax - [phone]  [facility] [address] [place], [place]  [address] Phone - [phone]   Fax - [phone]  [facility] [address] [place], [place]  [address] Phone - [phone]   Fax - [phone]  [facility] [address] [place], [place]  [address] Phone - [phone]  [facility] [address] [place], [place]  [address] Phone -  [phone]   Fax - [phone]  [facility] [address] [place], [place]  [address] Phone - [phone]   Fax - [phone]  [facility] [address] [place], [place]  [address] Phone - [phone]   Fax - [phone]  [facility] [address] [place], [place]  [address] Phone - [phone]   Fax - [phone]   [facility] [address] [place], [place]  [address] Phone - [phone]   Fax - [phone]  [facility] [address] [place], [place]  [address] Phone - [phone]   Fax - [phone]  [facility] [address] [place], [place]  [address] Phone - [phone]   Fax - [phone]  [facility] [address] [place], [place]  [address] Phone - [phone]   Fax - [phone]  [facility] [address] , [place]  [address] Phone - [phone]   Fax - [phone] [facility] [address] [place], [place]  [address] Phone - [phone]   Fax - [phone]  [facility] [address] [place], [place]  [address] Phone - [phone]   Fax - [phone]   [facility] [address] [place],   [address] Phone - [phone]   Fax - [phone]     

## 2014-12-26 NOTE — Progress Notes (Signed)
Subjective:  Margaret Keller is a 19 y.o. G3P0200 at [redacted]w[redacted]d being seen today for ongoing prenatal care.  [name], currently pregnant; Supervision of high-risk pregnancy; Cervical insufficiency during pregnancy, antepartum; Late prenatal care affecting pregnancy, antepartum; and Cervical cerclage suture present (placed 11/19/14), antepartum on her problem list.  [name]  Contractions: Not present. Vag. Bleeding: None.  Movement: Present. Denies leaking of fluid.   [name], current medications, past family history, past medical history, past social history, past surgical history and problem list. Problem list updated.  Objective:   Filed Vitals:   12/26/14 0909  BP: 128/71  Pulse: 92  Temp: 98.7 F (37.1 C)  Weight: 142 lb (64.411 kg)    Fetal Status: Fetal Heart Rate (bpm): 154 Fundal Height: 23 cm Movement: Present     General:  Alert, oriented and cooperative. Patient is in no acute distress.  Skin: Skin is warm and dry. No rash noted.   Cardiovascular: Normal heart rate noted  Respiratory: Normal respiratory effort, no problems with respiration noted  Abdomen: Soft, gravid, appropriate for gestational age. Pain/Pressure: Absent     Pelvic: Vag. Bleeding: None    Cervical exam deferred        Extremities: Normal range of motion.  Edema: None  Mental Status: Normal mood and affect. Normal behavior. Normal judgment and thought content.   Urinalysis: Urine Protein: Negative Urine Glucose: Negative  Assessment and Plan:  Pregnancy: G3P0200 at [redacted]w[redacted]d  1. History of preterm delivery, currently pregnant, second trimester 2. Cervical cerclage suture present (placed 11/19/14), antepartum Continue weekly 17P. Cervical length scan ordered - US MFM OB Transvaginal;  Future - US MFM OB FOLLOW UP; Future  3. Echogenic focus of heart, fetal, affecting care of mother, antepartum, not applicable or unspecified fetus - US MFM OB FOLLOW UP; Future  2. Supervision of high-risk pregnancy, second trimester Preterm labor symptoms and general obstetric precautions including but not limited to vaginal bleeding, contractions, leaking of fluid and fetal movement were reviewed in detail with the patient. Please refer to After Visit Summary for other counseling recommendations.  Return in about 4 weeks (around 01/23/2015) for OB Visit, 17P.  Continue weekly 17P injections also.   Tereso NewcomerUgonna A Antolin Belsito, MD

## 2014-12-26 NOTE — Progress Notes (Signed)
U/S for cervical length 01/02/15 @ 1115a with MFM.

## 2015-01-02 ENCOUNTER — Ambulatory Visit (HOSPITAL_COMMUNITY)
Admission: RE | Admit: 2015-01-02 | Discharge: 2015-01-02 | Disposition: A | Discharge status: Home / Self Care | Payer: Medicaid Other | Source: Ambulatory Visit | Attending: Obstetrics & Gynecology | Admitting: Obstetrics & Gynecology

## 2015-01-02 ENCOUNTER — Encounter (HOSPITAL_COMMUNITY): Payer: Self-pay

## 2015-01-02 ENCOUNTER — Ambulatory Visit (INDEPENDENT_AMBULATORY_CARE_PROVIDER_SITE_OTHER): Payer: Medicaid Other

## 2015-01-02 VITALS — BP 120/69 | HR 99 | Wt 143.5 lb

## 2015-01-02 DIAGNOSIS — Z3A24 24 weeks gestation of pregnancy: Secondary | ICD-10-CM | POA: Diagnosis not present

## 2015-01-02 DIAGNOSIS — O3432 Maternal care for cervical incompetence, second trimester: Secondary | ICD-10-CM | POA: Insufficient documentation

## 2015-01-02 DIAGNOSIS — O358XX0 Maternal care for other (suspected) fetal abnormality and damage, not applicable or unspecified: Secondary | ICD-10-CM

## 2015-01-02 DIAGNOSIS — O0992 Supervision of high risk pregnancy, unspecified, second trimester: Secondary | ICD-10-CM

## 2015-01-02 DIAGNOSIS — O35BXX0 Maternal care for other (suspected) fetal abnormality and damage, fetal cardiac anomalies, not applicable or unspecified: Secondary | ICD-10-CM

## 2015-01-02 DIAGNOSIS — O09212 Supervision of pregnancy with history of pre-term labor, second trimester: Secondary | ICD-10-CM | POA: Insufficient documentation

## 2015-01-02 DIAGNOSIS — O10012 Pre-existing essential hypertension complicating pregnancy, second trimester: Secondary | ICD-10-CM | POA: Insufficient documentation

## 2015-01-02 DIAGNOSIS — O09892 Supervision of other high risk pregnancies, second trimester: Secondary | ICD-10-CM

## 2015-01-02 DIAGNOSIS — O3430 Maternal care for cervical incompetence, unspecified trimester: Secondary | ICD-10-CM

## 2015-01-02 NOTE — Progress Notes (Signed)
Pt received her Makena injection . Pt tolerated well

## 2015-01-09 ENCOUNTER — Ambulatory Visit (INDEPENDENT_AMBULATORY_CARE_PROVIDER_SITE_OTHER): Payer: Medicaid Other

## 2015-01-09 VITALS — BP 139/68 | HR 99 | Temp 98.5°F | Resp 18 | Wt 146.1 lb

## 2015-01-09 DIAGNOSIS — O09892 Supervision of other high risk pregnancies, second trimester: Secondary | ICD-10-CM

## 2015-01-09 DIAGNOSIS — Z3490 Encounter for supervision of normal pregnancy, unspecified, unspecified trimester: Secondary | ICD-10-CM

## 2015-01-09 DIAGNOSIS — O09212 Supervision of pregnancy with history of pre-term labor, second trimester: Secondary | ICD-10-CM | POA: Diagnosis not present

## 2015-01-09 NOTE — Progress Notes (Signed)
Patient ID: Margaret Keller, female   DOB: 03/07/1995, 19 y.o.   MRN: [id number] Ordered 17p injection on 01/09/15.

## 2015-01-12 NOTE — L&D Delivery Note (Cosign Needed)
Delivery Note Labor course as follows:  At 0730 on 3/14 admitted for IOL 2/2 pre-eclampsia w/ severe features; magnesium started.  Labor augmented by foley bulb and cytotec and pitocin and arom. Prolonged latent phase, but after arom patient progressed normally.   At 2:40 PM a viable female was delivered via Vaginal, Spontaneous Delivery.  Nuchal cord present; reduced.  APGAR: 7, 8; weight 6 lb 7.2 oz (2925 g).   Placenta status: Intact, Spontaneous.  Cord: 3 vessels with the following complications: None.   Continued trickle of blood after delivery of placenta. Uterus swept x1, clot and small amount of membrane removed. cytotec 400 buccal given.  Anesthesia: Epidural  Episiotomy: None Lacerations:  L periurethral laceration  Suture Repair: 3.0 vicryl Est. Blood Loss (mL): 400  Mom to postpartum.  Baby to Couplet care / Skin to Skin.  Michael J [place] 03/26/2015, 4:29 PM

## 2015-01-16 ENCOUNTER — Ambulatory Visit (INDEPENDENT_AMBULATORY_CARE_PROVIDER_SITE_OTHER): Payer: Medicaid Other

## 2015-01-16 VITALS — BP 130/72 | HR 89 | Temp 98.8°F

## 2015-01-16 DIAGNOSIS — O09892 Supervision of other high risk pregnancies, second trimester: Secondary | ICD-10-CM

## 2015-01-16 DIAGNOSIS — O09212 Supervision of pregnancy with history of pre-term labor, second trimester: Secondary | ICD-10-CM

## 2015-01-16 NOTE — Patient Instructions (Signed)
Pt received injection hydroxprogesterone 250 given.

## 2015-01-20 ENCOUNTER — Inpatient Hospital Stay (HOSPITAL_COMMUNITY)
Admission: AD | Admit: 2015-01-20 | Discharge: 2015-01-20 | Disposition: A | Discharge status: Home / Self Care | Payer: Medicaid Other | Source: Ambulatory Visit | Attending: Obstetrics and Gynecology | Admitting: Obstetrics and Gynecology

## 2015-01-20 ENCOUNTER — Encounter (HOSPITAL_COMMUNITY): Payer: Self-pay | Admitting: *Deleted

## 2015-01-20 DIAGNOSIS — M545 Low back pain: Secondary | ICD-10-CM | POA: Insufficient documentation

## 2015-01-20 DIAGNOSIS — R109 Unspecified abdominal pain: Secondary | ICD-10-CM | POA: Insufficient documentation

## 2015-01-20 DIAGNOSIS — O3430 Maternal care for cervical incompetence, unspecified trimester: Secondary | ICD-10-CM

## 2015-01-20 DIAGNOSIS — O26892 Other specified pregnancy related conditions, second trimester: Secondary | ICD-10-CM | POA: Diagnosis not present

## 2015-01-20 DIAGNOSIS — Z3A27 27 weeks gestation of pregnancy: Secondary | ICD-10-CM | POA: Insufficient documentation

## 2015-01-20 DIAGNOSIS — M549 Dorsalgia, unspecified: Secondary | ICD-10-CM | POA: Diagnosis present

## 2015-01-20 DIAGNOSIS — O26899 Other specified pregnancy related conditions, unspecified trimester: Secondary | ICD-10-CM

## 2015-01-20 LAB — URINALYSIS, ROUTINE W REFLEX MICROSCOPIC
Bilirubin Urine: NEGATIVE
GLUCOSE, UA: NEGATIVE mg/dL
HGB URINE DIPSTICK: NEGATIVE
KETONES UR: NEGATIVE mg/dL
Nitrite: NEGATIVE
PH: 5.5 (ref 5.0–8.0)
PROTEIN: NEGATIVE mg/dL
Specific Gravity, Urine: <1.005 — ABNORMAL LOW (ref 1.005–1.030)

## 2015-01-20 LAB — URINE MICROSCOPIC-ADD ON

## 2015-01-20 NOTE — MAU Note (Signed)
Pt reports severe pain in her lower abd that radiates to her lower back. Denies dysuria, denies vaginal bleeding or discharge.

## 2015-01-20 NOTE — MAU Note (Signed)
Pt started having severe abd and back pain one hour ago Pain 7/10. Denies LOF and VB. +FM. No intercourse in over 24 hours. Hx of two 20 week deliveries. Is currently on 17P shots and has cerclage in place.

## 2015-01-20 NOTE — MAU Provider Note (Signed)
History     CSN: [id number]  Arrival date and time: 01/20/15 0441   None     Chief Complaint  Patient presents with  . Abdominal Pain  . Back Pain   HPI Margaret Keller is a 20 y.o. G3P0200 at [redacted]w[redacted]d presenting with acute onset low back and abdominal pain this morning. Patient was awoken from sleep with severe pain. She tried to go back to sleep however was unable to. Patient has history of 2 prior preterm deliveries and was concerned that she was having contractions. Pain started in her low back and radiated towards her lower abdomen. She has not noticed anything that makes the pain better or worse. She has not tried any medications. No fevers or chills. No nausea or vomiting. No vaginal bleeding or LOF. Good fetal movement.     OB History    Gravida Para Term Preterm AB TAB SAB Ectopic Multiple Living   3 2  2      0 0      Past Medical History  Diagnosis Date  . Preterm labor     Past Surgical History  Procedure Laterality Date  . No past surgeries    . Cervical cerclage N/A 11/19/2014    Procedure: CERCLAGE CERVICAL;  Surgeon: Tereso Newcomer, MD;  Location: [facility];  Service: Gynecology;  Laterality: N/A;    History reviewed. No pertinent family history.  Social History  Substance Use Topics  . Smoking status: Never Smoker   . Smokeless tobacco: Never Used  . Alcohol Use: No    Allergies: No Known Allergies  Facility-administered medications prior to admission  Medication Dose Route Frequency Provider Last Rate Last Dose  . hydroxyprogesterone caproate (DELALUTIN) 250 mg/mL injection 250 mg  250 mg Intramuscular Weekly Ugonna A Anyanwu, MD   250 mg at 01/16/15 1101   Prescriptions prior to admission  Medication Sig Dispense Refill Last Dose  . Prenatal Vit-Fe Fumarate-FA (PRENATAL MULTIVITAMIN) TABS tablet Take 1 tablet by mouth daily at 12 noon.   01/19/2015 at Unknown time  . docusate sodium (COLACE) 100 MG capsule Take 1 capsule (100 mg total) by mouth 2  (two) times daily as needed. 30 capsule 2 Taking    Review of Systems  Gastrointestinal: Positive for abdominal pain.  Musculoskeletal: Positive for back pain.  All other systems reviewed and are negative.  Physical Exam   Blood pressure 139/81, pulse 114, temperature 98.4 F (36.9 C), temperature source Oral, resp. rate 18, height 5\' 4"  (1.626 m), weight 68.04 kg (150 lb), last menstrual period 07/15/2014, SpO2 98 %.  Physical Exam  Nursing note and vitals reviewed. Constitutional: She is oriented to person, place, and time. She appears well-developed and well-nourished.  HENT:  Head: Normocephalic and atraumatic.  Eyes: Conjunctivae are normal. Pupils are equal, round, and reactive to light.  Neck: Normal range of motion. Neck supple.  Cardiovascular: Normal rate and normal heart sounds.   Respiratory: Effort normal and breath sounds normal.  GI: Soft. Bowel sounds are normal. She exhibits no mass. There is no tenderness. There is no rebound and no guarding.  Musculoskeletal: Normal range of motion. She exhibits no edema.  Neurological: She is alert and oriented to person, place, and time. No cranial nerve deficit.  Skin: Skin is warm and dry. No rash noted. No erythema.  Psychiatric: She has a normal mood and affect. Her behavior is normal.  FHT:  FHR: 157 bpm, variability: moderate,  accelerations:  present,  decelerations:  None  Uterine contractions: None  Dilation: Closed Exam by:: DR Jimmey RalphPARKER    MAU Course  Procedures  Assessment and Plan  Minnie T Battershell is a 20 y.o. G3P0200 at 7838w0d here with low back and abdominal pain. Physical exam benign. Cervix closed with cerclage in place. No contractions on tocometer. FHT category 1. UA with small leukocytes and few bacteria. Pain likely round ligament pain. No signs of preterm labor.  Plan: -Discharge home -Recommended tylenol as needed and/or maternity support belt for round ligament pain -Will send urine for  culture -Preterm labor precautions reviewed. -Will follow up at outpatient clinic.    Jacquiline DoeCaleb Parker 01/20/2015, 5:36 AM

## 2015-01-20 NOTE — Discharge Instructions (Signed)
Round Ligament Pain  The round ligament is a cord of muscle and tissue that helps to support the uterus. It can become a source of pain during pregnancy if it becomes stretched or twisted as the baby grows. The pain usually begins in the second trimester of pregnancy, and it can come and go until the baby is delivered. It is not a serious problem, and it does not cause harm to the baby.  Round ligament pain is usually a short, sharp, and pinching pain, but it can also be a dull, lingering, and aching pain. The pain is felt in the lower side of the abdomen or in the groin. It usually starts deep in the groin and moves up to the outside of the hip area. Pain can occur with:   A sudden change in position.   Rolling over in bed.   Coughing or sneezing.   Physical activity.  HOME CARE INSTRUCTIONS  Watch your condition for any changes. Take these steps to help with your pain:   When the pain starts, relax. Then try:    Sitting down.    Flexing your knees up to your abdomen.    Lying on your side with one pillow under your abdomen and another pillow between your legs.    Sitting in a warm bath for 15-20 minutes or until the pain goes away.   Take over-the-counter and prescription medicines only as told by your health care provider.   Move slowly when you sit and stand.   Avoid long walks if they cause pain.   Stop or lessen your physical activities if they cause pain.  SEEK MEDICAL CARE IF:   Your pain does not go away with treatment.   You feel pain in your back that you did not have before.   Your medicine is not helping.  SEEK IMMEDIATE MEDICAL CARE IF:   You develop a fever or chills.   You develop uterine contractions.   You develop vaginal bleeding.   You develop nausea or vomiting.   You develop diarrhea.   You have pain when you urinate.     This information is not intended to replace advice given to you by your health care provider. Make sure you discuss any questions you have with your health  care provider.     Document Released: 10/07/2007 Document Revised: 03/22/2011 Document Reviewed: 03/06/2014  Elsevier Interactive Patient Education 2016 [employer]

## 2015-01-23 ENCOUNTER — Ambulatory Visit (INDEPENDENT_AMBULATORY_CARE_PROVIDER_SITE_OTHER): Payer: Medicaid Other | Admitting: Obstetrics & Gynecology

## 2015-01-23 VITALS — BP 128/71 | HR 87 | Temp 98.6°F | Wt 148.6 lb

## 2015-01-23 DIAGNOSIS — Z23 Encounter for immunization: Secondary | ICD-10-CM

## 2015-01-23 DIAGNOSIS — O0993 Supervision of high risk pregnancy, unspecified, third trimester: Secondary | ICD-10-CM

## 2015-01-23 DIAGNOSIS — O09212 Supervision of pregnancy with history of pre-term labor, second trimester: Secondary | ICD-10-CM | POA: Diagnosis not present

## 2015-01-23 DIAGNOSIS — O3443 Maternal care for other abnormalities of cervix, third trimester: Secondary | ICD-10-CM

## 2015-01-23 DIAGNOSIS — O3433 Maternal care for cervical incompetence, third trimester: Secondary | ICD-10-CM

## 2015-01-23 LAB — CBC
HEMATOCRIT: 35.6 % — AB (ref 36.0–46.0)
Hemoglobin: 11.8 g/dL — ABNORMAL LOW (ref 12.0–15.0)
MCH: 29.1 pg (ref 26.0–34.0)
MCHC: 33.1 g/dL (ref 30.0–36.0)
MCV: 87.7 fL (ref 78.0–100.0)
MPV: 9.5 fL (ref 8.6–12.4)
PLATELETS: 135 10*3/uL — AB (ref 150–400)
RBC: 4.06 MIL/uL (ref 3.87–5.11)
RDW: 14.3 % (ref 11.5–15.5)
WBC: 12.8 10*3/uL — AB (ref 4.0–10.5)

## 2015-01-23 LAB — POCT URINALYSIS DIP (DEVICE)
Bilirubin Urine: NEGATIVE
GLUCOSE, UA: NEGATIVE mg/dL
Hgb urine dipstick: NEGATIVE
Ketones, ur: NEGATIVE mg/dL
Nitrite: NEGATIVE
PROTEIN: NEGATIVE mg/dL
Specific Gravity, Urine: 1.025 (ref 1.005–1.030)
UROBILINOGEN UA: 0.2 mg/dL (ref 0.0–1.0)
pH: 7 (ref 5.0–8.0)

## 2015-01-23 LAB — GLUCOSE TOLERANCE, 1 HOUR (50G) W/O FASTING: GLUCOSE 1 HOUR GTT: 96 mg/dL (ref 70–140)

## 2015-01-23 MED ORDER — TETANUS-DIPHTH-ACELL PERTUSSIS 5-2.5-18.5 LF-MCG/0.5 IM SUSP
0.5000 mL | Freq: Once | INTRAMUSCULAR | Status: AC
  Administered 2015-01-23: 0.5 mL via INTRAMUSCULAR

## 2015-01-23 NOTE — Patient Instructions (Signed)

## 2015-01-23 NOTE — Progress Notes (Signed)
Breastfeeding tip of the week reviewed 17-p today 1hr gtt, 28 week labs today

## 2015-01-23 NOTE — Progress Notes (Signed)
US f/u next week  Subjective:  Margaret Keller is a 20 y.o. G3P0200 at 6813w3d being seen today for ongoing prenatal care.  [name], currently pregnant; Supervision of high-risk pregnancy; Cervical insufficiency during pregnancy, antepartum; Late prenatal care affecting pregnancy, antepartum; and Cervical cerclage suture present (placed 11/19/14), antepartum on her problem list.  [name]  Contractions: Not present. Vag. Bleeding: None.  Movement: Present. Denies leaking of fluid.   [name], current medications, past family history, past medical history, past social history, past surgical history and problem list. Problem list updated.  Objective:   Filed Vitals:   01/23/15 0928  BP: 128/71  Pulse: 87  Temp: 98.6 F (37 C)  Weight: 148 lb 9.6 oz (67.405 kg)    Fetal Status: Fetal Heart Rate (bpm): 151   Movement: Present     General:  Alert, oriented and cooperative. Patient is in no acute distress.  Skin: Skin is warm and dry. No rash noted.   Cardiovascular: Normal heart rate noted  Respiratory: Normal respiratory effort, no problems with respiration noted  Abdomen: Soft, gravid, appropriate for gestational age. Pain/Pressure: Present     Pelvic: Vag. Bleeding: None     Cervical exam deferred        Extremities: Normal range of motion.  Edema: None  Mental Status: Normal mood and affect. Normal behavior. Normal judgment and thought content.   Urinalysis:      Assessment and Plan:  Pregnancy: G3P0200 at 5713w3d  1. Supervision of high-risk pregnancy, third trimester Nl growth - Glucose Tolerance, 1 HR (50g) w/o Fasting - CBC - HIV antibody (with reflex) - RPR - Tdap (BOOSTRIX) injection 0.5 mL; Inject 0.5 mLs into the muscle once.  2. Cervical  insufficiency during pregnancy, antepartum, third trimester Nl cx length on [place]   Preterm labor symptoms and general obstetric precautions including but not limited to vaginal bleeding, contractions, leaking of fluid and fetal movement were reviewed in detail with the patient. Please refer to After Visit Summary for other counseling recommendations.  Return in about 3 weeks (around 02/13/2015).   Adam PhenixJames G Osric Klopf, MD ]

## 2015-01-24 LAB — RPR

## 2015-01-24 LAB — HIV ANTIBODY (ROUTINE TESTING W REFLEX): HIV: NONREACTIVE

## 2015-01-30 ENCOUNTER — Ambulatory Visit (HOSPITAL_COMMUNITY)
Admission: RE | Admit: 2015-01-30 | Discharge: 2015-01-30 | Disposition: A | Discharge status: Home / Self Care | Payer: Medicaid Other | Source: Ambulatory Visit | Attending: Obstetrics and Gynecology | Admitting: Obstetrics and Gynecology

## 2015-01-30 ENCOUNTER — Ambulatory Visit (INDEPENDENT_AMBULATORY_CARE_PROVIDER_SITE_OTHER): Payer: Medicaid Other | Admitting: *Deleted

## 2015-01-30 VITALS — BP 133/69 | HR 107 | Temp 98.4°F | Wt 151.3 lb

## 2015-01-30 DIAGNOSIS — Z3A28 28 weeks gestation of pregnancy: Secondary | ICD-10-CM | POA: Diagnosis not present

## 2015-01-30 DIAGNOSIS — O09213 Supervision of pregnancy with history of pre-term labor, third trimester: Secondary | ICD-10-CM | POA: Insufficient documentation

## 2015-01-30 DIAGNOSIS — O10013 Pre-existing essential hypertension complicating pregnancy, third trimester: Secondary | ICD-10-CM | POA: Diagnosis not present

## 2015-01-30 DIAGNOSIS — O3433 Maternal care for cervical incompetence, third trimester: Secondary | ICD-10-CM | POA: Diagnosis not present

## 2015-01-30 DIAGNOSIS — O09212 Supervision of pregnancy with history of pre-term labor, second trimester: Secondary | ICD-10-CM | POA: Diagnosis not present

## 2015-01-30 DIAGNOSIS — O09892 Supervision of other high risk pregnancies, second trimester: Secondary | ICD-10-CM

## 2015-01-30 DIAGNOSIS — O3430 Maternal care for cervical incompetence, unspecified trimester: Secondary | ICD-10-CM

## 2015-02-01 ENCOUNTER — Inpatient Hospital Stay (HOSPITAL_COMMUNITY)
Admission: AD | Admit: 2015-02-01 | Discharge: 2015-02-01 | Disposition: A | Discharge status: Home / Self Care | Payer: Self-pay | Source: Ambulatory Visit | Attending: Obstetrics and Gynecology | Admitting: Obstetrics and Gynecology

## 2015-02-01 ENCOUNTER — Encounter (HOSPITAL_COMMUNITY): Payer: Self-pay | Admitting: *Deleted

## 2015-02-01 ENCOUNTER — Inpatient Hospital Stay (HOSPITAL_COMMUNITY)
Admission: AD | Admit: 2015-02-01 | Discharge: 2015-02-01 | Disposition: A | Discharge status: Home / Self Care | Payer: Medicaid Other | Source: Ambulatory Visit | Attending: Obstetrics and Gynecology | Admitting: Obstetrics and Gynecology

## 2015-02-01 DIAGNOSIS — O212 Late vomiting of pregnancy: Secondary | ICD-10-CM | POA: Diagnosis not present

## 2015-02-01 DIAGNOSIS — R197 Diarrhea, unspecified: Secondary | ICD-10-CM | POA: Diagnosis not present

## 2015-02-01 DIAGNOSIS — O3433 Maternal care for cervical incompetence, third trimester: Secondary | ICD-10-CM

## 2015-02-01 DIAGNOSIS — O26893 Other specified pregnancy related conditions, third trimester: Secondary | ICD-10-CM | POA: Insufficient documentation

## 2015-02-01 DIAGNOSIS — O0932 Supervision of pregnancy with insufficient antenatal care, second trimester: Secondary | ICD-10-CM

## 2015-02-01 DIAGNOSIS — Z3A28 28 weeks gestation of pregnancy: Secondary | ICD-10-CM | POA: Insufficient documentation

## 2015-02-01 DIAGNOSIS — O0993 Supervision of high risk pregnancy, unspecified, third trimester: Secondary | ICD-10-CM

## 2015-02-01 DIAGNOSIS — O09212 Supervision of pregnancy with history of pre-term labor, second trimester: Secondary | ICD-10-CM

## 2015-02-01 DIAGNOSIS — O09892 Supervision of other high risk pregnancies, second trimester: Secondary | ICD-10-CM

## 2015-02-01 DIAGNOSIS — O219 Vomiting of pregnancy, unspecified: Secondary | ICD-10-CM | POA: Diagnosis not present

## 2015-02-01 DIAGNOSIS — O3430 Maternal care for cervical incompetence, unspecified trimester: Secondary | ICD-10-CM

## 2015-02-01 LAB — URINE MICROSCOPIC-ADD ON
Bacteria, UA: NONE SEEN
RBC / HPF: NONE SEEN RBC/hpf (ref 0–5)

## 2015-02-01 LAB — URINALYSIS, ROUTINE W REFLEX MICROSCOPIC
Bilirubin Urine: NEGATIVE
GLUCOSE, UA: NEGATIVE mg/dL
HGB URINE DIPSTICK: NEGATIVE
KETONES UR: 40 mg/dL — AB
LEUKOCYTES UA: NEGATIVE
Nitrite: NEGATIVE
PROTEIN: 30 mg/dL — AB
Specific Gravity, Urine: 1.02 (ref 1.005–1.030)
pH: 6.5 (ref 5.0–8.0)

## 2015-02-01 MED ORDER — LACTATED RINGERS IV BOLUS (SEPSIS)
1000.0000 mL | Freq: Once | INTRAVENOUS | Status: AC
  Administered 2015-02-01: 1000 mL via INTRAVENOUS

## 2015-02-01 MED ORDER — ONDANSETRON HCL 4 MG/2ML IJ SOLN
4.0000 mg | Freq: Once | INTRAMUSCULAR | Status: AC
  Administered 2015-02-01: 4 mg via INTRAVENOUS
  Filled 2015-02-01: qty 2

## 2015-02-01 MED ORDER — DEXTROSE 5 % IN LACTATED RINGERS IV BOLUS
1000.0000 mL | Freq: Once | INTRAVENOUS | Status: AC
  Administered 2015-02-01: 1000 mL via INTRAVENOUS

## 2015-02-01 NOTE — MAU Note (Signed)
Has had 2 losses- 20wks and 22wks. Contractions since 1930. Denies LOF or bleeding

## 2015-02-01 NOTE — MAU Provider Note (Signed)
History     CSN: [id number]  Arrival date and time: 02/01/15 4098  Seen by provider at 0700    Chief Complaint  Patient presents with  . Contractions   HPI   Ms. Margaret Keller is a 20 y.o. female G3P0200 at [redacted]w[redacted]d with a poor OB history, currently with a cervical cerclage in place, presents to MAU with contractions that started last night at 7pm, Nausea and diarrhea.  She is not feeling contractions now, however last night she was feeling them every 5 mins. She has a history of two second trimester losses, with cervical insufficiency.  Patient had US done Thursday and cervical length was 3.2 cm  She denies bleeding or leaking of fluid.  No one in her house is currently sick.  + fetal movement.   Patient reports 4 episodes of diarrhea in the last 24 hours.   OB History    Gravida Para Term Preterm AB TAB SAB Ectopic Multiple Living   0 0      Past Medical History  Diagnosis Date  . Preterm labor     Past Surgical History  Procedure Laterality Date  . Cervical cerclage N/A 11/19/2014    Procedure: CERCLAGE CERVICAL;  Surgeon: Tereso Newcomer, MD;  Location: [facility];  Service: Gynecology;  Laterality: N/A;    History reviewed. No pertinent family history.  Social History  Substance Use Topics  . Smoking status: Never Smoker   . Smokeless tobacco: Never Used  . Alcohol Use: No    Allergies: No Known Allergies  Facility-administered medications prior to admission  Medication Dose Route Frequency Provider Last Rate Last Dose  . hydroxyprogesterone caproate (DELALUTIN) 250 mg/mL injection 250 mg  250 mg Intramuscular Weekly Ugonna A Anyanwu, MD   250 mg at 01/30/15 1101   Prescriptions prior to admission  Medication Sig Dispense Refill Last Dose  . acetaminophen (TYLENOL) 500 MG tablet Take 1,000 mg by mouth every 6 (six) hours as needed for moderate pain.   prn  . Prenatal Vit-Fe Fumarate-FA (PRENATAL MULTIVITAMIN) TABS tablet Take 1 tablet by mouth  daily at 12 noon.   01/31/2015 at Unknown time  . docusate sodium (COLACE) 100 MG capsule Take 1 capsule (100 mg total) by mouth 2 (two) times daily as needed. (Patient not taking: Reported on 01/23/2015) 30 capsule 2 Not Taking at Unknown time   Results for orders placed or performed during the hospital encounter of 02/01/15 (from the past 72 hour(s))  Urinalysis, Routine w reflex microscopic (not at [facility])     Status: Abnormal   Collection Time: 02/01/15  6:55 AM  Result Value Ref Range   Color, Urine YELLOW YELLOW   APPearance CLEAR CLEAR   Specific Gravity, Urine 1.020 1.005 - 1.030   pH 6.5 5.0 - 8.0   Glucose, UA NEGATIVE NEGATIVE mg/dL   Hgb urine dipstick NEGATIVE NEGATIVE   Bilirubin Urine NEGATIVE NEGATIVE   Ketones, ur 40 (A) NEGATIVE mg/dL   Protein, ur 30 (A) NEGATIVE mg/dL   Nitrite NEGATIVE NEGATIVE   Leukocytes, UA NEGATIVE NEGATIVE  Urine microscopic-add on     Status: Abnormal   Collection Time: 02/01/15  6:55 AM  Result Value Ref Range   Squamous Epithelial / LPF 0-5 (A) NONE SEEN   WBC, UA 0-5 0 - 5 WBC/hpf   RBC / HPF NONE SEEN 0 - 5 RBC/hpf   Bacteria, UA NONE SEEN NONE SEEN   Urine-Other MUCOUS  PRESENT     Review of Systems  Constitutional: Negative for fever.  Eyes: Negative for blurred vision.  Gastrointestinal: Positive for nausea, vomiting and diarrhea. Negative for abdominal pain.  Genitourinary: Negative for dysuria.  Neurological: Negative for headaches.   Physical Exam   Blood pressure 128/74, pulse 116, height  (1.626 m), weight 149 lb (67.586 kg), last menstrual period 07/15/2014, SpO2 98 %.  Physical Exam  Constitutional: She is oriented to person, place, and time. She appears well-developed and well-nourished. No distress.  HENT:  Head: Normocephalic.  Eyes: Pupils are equal, round, and reactive to light.  Neck: Neck supple.  GI: Soft. There is no tenderness.  Genitourinary:  Dilation: Closed Exam by:: Shela Commons Rasch NP  Musculoskeletal:  Normal range of motion.  Neurological: She is alert and oriented to person, place, and time.  Skin: Skin is warm. She is not diaphoretic.  Psychiatric: Her behavior is normal.   Fetal Tracing: Baseline: 145 bpm  Variability: moderate  Accelerations: 15x15 Decelerations: None Toco: UI  MAU Course  Procedures  None  MDM  Urine shows moderate dehydration.  LR bolus D5LR bolus  Zofran 4 mg IV Report given to Lilyan Punt NP who resumes care . Patient is comfortable and receiving IV fluids at this time.   Duane Lope, NP Client is feeling much better after 2 bags of fluids.  FHT 150 with moderate variability.  Accels of 10x10 noted which is normal for 28 weeks.  Occasional uterine contraction.  Client is feeling much better with no nausea or vomiting at this time.  Will discharge when fluids are completed.  Assessment and Plan  28 week IUP with cervical cerclage Vomiting and diarrhea  Plan Continue taking fluids well. Return if vomiting and diarrhea resume or if you have increasing contractions or leaking of fluid. Keep your appointments as scheduled in the clinic.

## 2015-02-01 NOTE — Discharge Instructions (Signed)
Continue taking fluids well. Return if vomiting and diarrhea resume or if you have increasing contractions or leaking of fluid. Keep your appointments as scheduled in the clinic.

## 2015-02-06 ENCOUNTER — Ambulatory Visit: Payer: Medicaid Other

## 2015-02-06 VITALS — BP 128/70 | HR 108

## 2015-02-06 DIAGNOSIS — O09892 Supervision of other high risk pregnancies, second trimester: Secondary | ICD-10-CM

## 2015-02-06 DIAGNOSIS — O09212 Supervision of pregnancy with history of pre-term labor, second trimester: Principal | ICD-10-CM

## 2015-02-13 ENCOUNTER — Ambulatory Visit (INDEPENDENT_AMBULATORY_CARE_PROVIDER_SITE_OTHER): Payer: Medicaid Other | Admitting: Obstetrics & Gynecology

## 2015-02-13 VITALS — BP 137/70 | HR 107 | Temp 98.4°F | Wt 154.5 lb

## 2015-02-13 DIAGNOSIS — O3443 Maternal care for other abnormalities of cervix, third trimester: Secondary | ICD-10-CM

## 2015-02-13 DIAGNOSIS — O3433 Maternal care for cervical incompetence, third trimester: Secondary | ICD-10-CM

## 2015-02-13 DIAGNOSIS — O09213 Supervision of pregnancy with history of pre-term labor, third trimester: Secondary | ICD-10-CM

## 2015-02-13 LAB — POCT URINALYSIS DIP (DEVICE)
Bilirubin Urine: NEGATIVE
GLUCOSE, UA: NEGATIVE mg/dL
Ketones, ur: NEGATIVE mg/dL
NITRITE: NEGATIVE
Protein, ur: NEGATIVE mg/dL
Specific Gravity, Urine: 1.02 (ref 1.005–1.030)
UROBILINOGEN UA: 0.2 mg/dL (ref 0.0–1.0)
pH: 7 (ref 5.0–8.0)

## 2015-02-13 NOTE — Progress Notes (Signed)
Subjective:  Margaret Keller is a 20 y.o. G3P0200 at [redacted]w[redacted]d being seen today for ongoing prenatal care.  [name], currently pregnant; Supervision of high-risk pregnancy; Cervical insufficiency during pregnancy, antepartum; Late prenatal care affecting pregnancy, antepartum; and Cervical cerclage suture present (placed 11/19/14), antepartum on her problem list.  [name]  Contractions: Irregular. Vag. Bleeding: None.  Movement: Present. Denies leaking of fluid.   [name], current medications, past family history, past medical history, past social history, past surgical history and problem list. Problem list updated.  Objective:   Filed Vitals:   02/13/15 0902  BP: 137/70  Pulse: 107  Temp: 98.4 F (36.9 C)  Weight: 154 lb 8 oz (70.081 kg)    Fetal Status: Fetal Heart Rate (bpm): 146   Movement: Present     General:  Alert, oriented and cooperative. Patient is in no acute distress.  Skin: Skin is warm and dry. No rash noted.   Cardiovascular: Normal heart rate noted  Respiratory: Normal respiratory effort, no problems with respiration noted  Abdomen: Soft, gravid, appropriate for gestational age. Pain/Pressure: Present     Pelvic: Vag. Bleeding: None     Cervical exam deferred        Extremities: Normal range of motion.  Edema: Trace  Mental Status: Normal mood and affect. Normal behavior. Normal judgment and thought content.   Urinalysis:      Assessment and Plan:  Pregnancy: G3P0200 at [redacted]w[redacted]d  1. Cervical insufficiency during pregnancy, antepartum, third trimester Cerclage in place and doing well  Preterm labor symptoms and general obstetric precautions including but not limited to vaginal bleeding, contractions, leaking of fluid and fetal movement were reviewed  in detail with the patient. Please refer to After Visit Summary for other counseling recommendations.  Return in about 2 weeks (around 02/27/2015).   Adam Phenix, MD

## 2015-02-13 NOTE — Patient Instructions (Signed)
Preterm Labor Information Preterm labor is when labor starts at less than 37 weeks of pregnancy. The normal length of a pregnancy is 39 to 41 weeks. CAUSES Often, there is no identifiable underlying cause as to why a woman goes into preterm labor. One of the most common known causes of preterm labor is infection. Infections of the uterus, cervix, vagina, amniotic sac, bladder, kidney, or even the lungs (pneumonia) can cause labor to start. Other suspected causes of preterm labor include:   Urogenital infections, such as yeast infections and bacterial vaginosis.   Uterine abnormalities (uterine shape, uterine septum, fibroids, or bleeding from the placenta).   A cervix that has been operated on (it may fail to stay closed).   Malformations in the fetus.   Multiple gestations (twins, triplets, and so on).   Breakage of the amniotic sac.  RISK FACTORS Having a previous history of preterm labor.  Fetal Movement Counts Patient Name: __________________________________________________ Patient Due Date: ____________________ Performing a fetal movement count is highly recommended in high-risk pregnancies, but it is good for every pregnant woman to do. Your health care provider may ask you to start counting fetal movements at 28 weeks of the pregnancy. Fetal movements often increase: After eating a full meal. After physical activity. After eating or drinking something sweet or cold. At rest. Pay attention to when you feel the baby is most active. This will help you notice a pattern of your baby's sleep and wake cycles and what factors contribute to an increase in fetal movement. It is important to perform a fetal movement count at the same time each day when your baby is normally most active.  HOW TO COUNT FETAL MOVEMENTS Find a quiet and comfortable area to sit or lie down on your left side. Lying on your left side provides the best blood and oxygen circulation to your baby. Write down the  day and time on a sheet of paper or in a journal. Start counting kicks, flutters, swishes, rolls, or jabs in a 2-hour period. You should feel at least 10 movements within 2 hours. If you do not feel 10 movements in 2 hours, wait 2-3 hours and count again. Look for a change in the pattern or not enough counts in 2 hours. SEEK MEDICAL CARE IF: You feel less than 10 counts in 2 hours, tried twice. There is no movement in over an hour. The pattern is changing or taking longer each day to reach 10 counts in 2 hours. You feel the baby is not moving as he or she usually does. Date: ____________ Movements: ____________ Start time: ____________ Margaret Keller time: ____________  Date: ____________ Movements: ____________ Start time: ____________ Margaret Keller time: ____________ Date: ____________ Movements: ____________ Start time: ____________ Margaret Keller time: ____________ Date: ____________ Movements: ____________ Start time: ____________ Margaret Keller time: ____________ Date: ____________ Movements: ____________ Start time: ____________ Margaret Keller time: ____________ Date: ____________ Movements: ____________ Start time: ____________ Margaret Keller time: ____________ Date: ____________ Movements: ____________ Start time: ____________ Margaret Keller time: ____________ Date: ____________ Movements: ____________ Start time: ____________ Margaret Keller time: ____________  Date: ____________ Movements: ____________ Start time: ____________ Margaret Keller time: ____________ Date: ____________ Movements: ____________ Start time: ____________ Margaret Keller time: ____________ Date: ____________ Movements: ____________ Start time: ____________ Margaret Keller time: ____________ Date: ____________ Movements: ____________ Start time: ____________ Margaret Keller time: ____________ Date: ____________ Movements: ____________ Start time: ____________ Margaret Keller time: ____________ Date: ____________ Movements: ____________ Start time: ____________ Margaret Keller time: ____________ Date: ____________ Movements:  ____________ Start time: ____________ Margaret Keller time: ____________  Date: ____________ Movements: ____________ Start time:  ____________ Margaret Keller time: ____________ Date: ____________ Movements: ____________ Start time: ____________ Margaret Keller time: ____________ Date: ____________ Movements: ____________ Start time: ____________ Margaret Keller time: ____________ Date: ____________ Movements: ____________ Start time: ____________ Margaret Keller time: ____________ Date: ____________ Movements: ____________ Start time: ____________ Margaret Keller time: ____________ Date: ____________ Movements: ____________ Start time: ____________ Margaret Keller time: ____________ Date: ____________ Movements: ____________ Start time: ____________ Margaret Keller time: ____________  Date: ____________ Movements: ____________ Start time: ____________ Margaret Keller time: ____________ Date: ____________ Movements: ____________ Start time: ____________ Margaret Keller time: ____________ Date: ____________ Movements: ____________ Start time: ____________ Margaret Keller time: ____________ Date: ____________ Movements: ____________ Start time: ____________ Margaret Keller time: ____________ Date: ____________ Movements: ____________ Start time: ____________ Margaret Keller time: ____________ Date: ____________ Movements: ____________ Start time: ____________ Margaret Keller time: ____________ Date: ____________ Movements: ____________ Start time: ____________ Margaret Keller time: ____________  Date: ____________ Movements: ____________ Start time: ____________ Margaret Keller time: ____________ Date: ____________ Movements: ____________ Start time: ____________ Margaret Keller time: ____________ Date: ____________ Movements: ____________ Start time: ____________ Margaret Keller time: ____________ Date: ____________ Movements: ____________ Start time: ____________ Margaret Keller time: ____________ Date: ____________ Movements: ____________ Start time: ____________ Margaret Keller time: ____________ Date: ____________ Movements: ____________ Start time: ____________ Margaret Keller  time: ____________ Date: ____________ Movements: ____________ Start time: ____________ Margaret Keller time: ____________  Date: ____________ Movements: ____________ Start time: ____________ Margaret Keller time: ____________ Date: ____________ Movements: ____________ Start time: ____________ Margaret Keller time: ____________ Date: ____________ Movements: ____________ Start time: ____________ Margaret Keller time: ____________ Date: ____________ Movements: ____________ Start time: ____________ Margaret Keller time: ____________ Date: ____________ Movements: ____________ Start time: ____________ Margaret Keller time: ____________ Date: ____________ Movements: ____________ Start time: ____________ Margaret Keller time: ____________ Date: ____________ Movements: ____________ Start time: ____________ Margaret Keller time: ____________  Date: ____________ Movements: ____________ Start time: ____________ Margaret Keller time: ____________ Date: ____________ Movements: ____________ Start time: ____________ Margaret Keller time: ____________ Date: ____________ Movements: ____________ Start time: ____________ Margaret Keller time: ____________ Date: ____________ Movements: ____________ Start time: ____________ Margaret Keller time: ____________ Date: ____________ Movements: ____________ Start time: ____________ Margaret Keller time: ____________ Date: ____________ Movements: ____________ Start time: ____________ Margaret Keller time: ____________ Date: ____________ Movements: ____________ Start time: ____________ Margaret Keller time: ____________  Date: ____________ Movements: ____________ Start time: ____________ Margaret Keller time: ____________ Date: ____________ Movements: ____________ Start time: ____________ Margaret Keller time: ____________ Date: ____________ Movements: ____________ Start time: ____________ Margaret Keller time: ____________ Date: ____________ Movements: ____________ Start time: ____________ Margaret Keller time: ____________ Date: ____________ Movements: ____________ Start time: ____________ Margaret Keller time: ____________ Date: ____________  Movements: ____________ Start time: ____________ Margaret Keller time: ____________   This information is not intended to replace advice given to you by your health care provider. Make sure you discuss any questions you have with your health care provider.   Document Released: 01/27/2006 Document Revised: 01/18/2014 Document Reviewed: 10/25/2011 Elsevier Interactive Patient Education [employer]  Having premature rupture of membranes (PROM).   Having a placenta that covers the opening of the cervix (placenta previa).   Having a placenta that separates from the uterus (placental abruption).   Having a cervix that is too weak to hold the fetus in the uterus (incompetent cervix).   Having too much fluid in the amniotic sac (polyhydramnios).   Taking illegal drugs or smoking while pregnant.   Not gaining enough weight while pregnant.   Being younger than 14 and older than 22 years old.   Having a low socioeconomic status.   Being African American. SYMPTOMS Signs and symptoms of preterm labor include:   Menstrual-like cramps, abdominal pain, or back pain.  Uterine contractions that are regular, as frequent as six in an hour, regardless of their intensity (may be mild  or painful).  Contractions that start on the top of the uterus and spread down to the lower abdomen and back.   A sense of increased pelvic pressure.   A watery or bloody mucus discharge that comes from the vagina.  TREATMENT Depending on the length of the pregnancy and other circumstances, your health care provider may suggest bed rest. If necessary, there are medicines that can be given to stop contractions and to mature the fetal lungs. If labor happens before 34 weeks of pregnancy, a prolonged hospital stay may be recommended. Treatment depends on the condition of both you and the fetus.  WHAT SHOULD YOU DO IF YOU THINK YOU ARE IN PRETERM LABOR? Call your health care provider right away. You will need  to go to the hospital to get checked immediately. HOW CAN YOU PREVENT PRETERM LABOR IN FUTURE PREGNANCIES? You should:   Stop smoking if you smoke.  Maintain healthy weight gain and avoid chemicals and drugs that are not necessary.  Be watchful for any type of infection.  Inform your health care provider if you have a known history of preterm labor.   This information is not intended to replace advice given to you by your health care provider. Make sure you discuss any questions you have with your health care provider.   Document Released: 03/20/2003 Document Revised: 08/30/2012 Document Reviewed: 01/31/2012 Elsevier Interactive Patient Education [employer]

## 2015-02-13 NOTE — Progress Notes (Signed)
C/o contractions every few days.

## 2015-02-20 ENCOUNTER — Ambulatory Visit (INDEPENDENT_AMBULATORY_CARE_PROVIDER_SITE_OTHER): Payer: Medicaid Other

## 2015-02-20 VITALS — BP 118/66 | HR 66 | Temp 98.6°F

## 2015-02-20 DIAGNOSIS — O09212 Supervision of pregnancy with history of pre-term labor, second trimester: Secondary | ICD-10-CM | POA: Diagnosis present

## 2015-02-20 DIAGNOSIS — O09892 Supervision of other high risk pregnancies, second trimester: Secondary | ICD-10-CM

## 2015-02-27 ENCOUNTER — Ambulatory Visit (INDEPENDENT_AMBULATORY_CARE_PROVIDER_SITE_OTHER): Payer: Medicaid Other | Admitting: Obstetrics & Gynecology

## 2015-02-27 VITALS — BP 121/63 | HR 109 | Temp 98.7°F | Wt 156.0 lb

## 2015-02-27 DIAGNOSIS — O3433 Maternal care for cervical incompetence, third trimester: Secondary | ICD-10-CM

## 2015-02-27 DIAGNOSIS — O0993 Supervision of high risk pregnancy, unspecified, third trimester: Secondary | ICD-10-CM

## 2015-02-27 DIAGNOSIS — O09893 Supervision of other high risk pregnancies, third trimester: Secondary | ICD-10-CM

## 2015-02-27 DIAGNOSIS — O3443 Maternal care for other abnormalities of cervix, third trimester: Secondary | ICD-10-CM

## 2015-02-27 DIAGNOSIS — O09213 Supervision of pregnancy with history of pre-term labor, third trimester: Secondary | ICD-10-CM

## 2015-02-27 LAB — POCT URINALYSIS DIP (DEVICE)
BILIRUBIN URINE: NEGATIVE
Glucose, UA: NEGATIVE mg/dL
Ketones, ur: NEGATIVE mg/dL
LEUKOCYTES UA: NEGATIVE
NITRITE: NEGATIVE
PH: 7 (ref 5.0–8.0)
Protein, ur: NEGATIVE mg/dL
Specific Gravity, Urine: 1.02 (ref 1.005–1.030)
UROBILINOGEN UA: 0.2 mg/dL (ref 0.0–1.0)

## 2015-02-27 NOTE — Progress Notes (Signed)
Subjective:  Margaret Keller is a 20 y.o. G3P0200 at [redacted]w[redacted]d being seen today for ongoing prenatal care.  [name], currently pregnant; Supervision of high-risk pregnancy; Cervical insufficiency during pregnancy, antepartum; Late prenatal care affecting pregnancy, antepartum; and Cervical cerclage suture present (placed 11/19/14), antepartum on her problem list.  Patient reports intense pelvic pressure.  Contractions: Not present. Vag. Bleeding: None.  Movement: Present. Denies leaking of fluid.   [name], current medications, past family history, past medical history, past social history, past surgical history and problem list. Problem list updated.  Objective:   Filed Vitals:   02/27/15 1059  BP: 121/63  Pulse: 109  Temp: 98.7 F (37.1 C)  Weight: 156 lb (70.761 kg)    Fetal Status: Fetal Heart Rate (bpm): 149 Fundal Height: 32 cm Movement: Present     General:  Alert, oriented and cooperative. Patient is in no acute distress.  Skin: Skin is warm and dry. No rash noted.   Cardiovascular: Normal heart rate noted  Respiratory: Normal respiratory effort, no problems with respiration noted  Abdomen: Soft, gravid, appropriate for gestational age. Pain/Pressure: Present     Pelvic: Vag. Bleeding: None    Cervical exam performed Dilation: Closed Effacement (%): 50 Station: -3  Extremities: Normal range of motion.  Edema: None  Mental Status: Normal mood and affect. Normal behavior. Normal judgment and thought content.   Urinalysis: Urine Protein: Negative Urine Glucose: Negative  Assessment and Plan:  Pregnancy: G3P0200 at [redacted]w[redacted]d  1. Cervical insufficiency during pregnancy, antepartum, third trimester 2. History of preterm delivery, currently pregnant,third trimester Closed cervix, cerclage  in place. Patient and husband reassured. Continue weekly 17P.  3. Supervision of high-risk pregnancy, third trimester Preterm labor symptoms and general obstetric precautions including but not limited to vaginal bleeding, contractions, leaking of fluid and fetal movement were reviewed in detail with the patient. Please refer to After Visit Summary for other counseling recommendations.  Return for RTC in one week for 17P only.  Also return in 2 weeks for 17P and OB visit.Marland Kitchen   Margaret Newcomer, MD

## 2015-02-27 NOTE — Progress Notes (Signed)
Reviewed tip of week with patient Patient reports intense amount of vaginal pressure recently

## 2015-03-05 ENCOUNTER — Encounter: Payer: Self-pay | Admitting: *Deleted

## 2015-03-06 ENCOUNTER — Ambulatory Visit (INDEPENDENT_AMBULATORY_CARE_PROVIDER_SITE_OTHER): Payer: Medicaid Other | Admitting: *Deleted

## 2015-03-06 VITALS — BP 142/76 | HR 104 | Temp 98.2°F | Wt 158.3 lb

## 2015-03-06 DIAGNOSIS — O09213 Supervision of pregnancy with history of pre-term labor, third trimester: Secondary | ICD-10-CM | POA: Diagnosis present

## 2015-03-06 DIAGNOSIS — O09893 Supervision of other high risk pregnancies, third trimester: Secondary | ICD-10-CM

## 2015-03-13 ENCOUNTER — Ambulatory Visit (INDEPENDENT_AMBULATORY_CARE_PROVIDER_SITE_OTHER): Payer: Medicaid Other | Admitting: Obstetrics & Gynecology

## 2015-03-13 VITALS — BP 136/63 | HR 98 | Temp 98.2°F | Wt 160.0 lb

## 2015-03-13 DIAGNOSIS — O09893 Supervision of other high risk pregnancies, third trimester: Secondary | ICD-10-CM

## 2015-03-13 DIAGNOSIS — O09213 Supervision of pregnancy with history of pre-term labor, third trimester: Secondary | ICD-10-CM | POA: Diagnosis present

## 2015-03-13 DIAGNOSIS — O3432 Maternal care for cervical incompetence, second trimester: Secondary | ICD-10-CM

## 2015-03-13 DIAGNOSIS — O3430 Maternal care for cervical incompetence, unspecified trimester: Secondary | ICD-10-CM

## 2015-03-13 DIAGNOSIS — O0993 Supervision of high risk pregnancy, unspecified, third trimester: Secondary | ICD-10-CM

## 2015-03-13 LAB — POCT URINALYSIS DIP (DEVICE)
Bilirubin Urine: NEGATIVE
Glucose, UA: NEGATIVE mg/dL
KETONES UR: NEGATIVE mg/dL
Nitrite: NEGATIVE
PH: 7 (ref 5.0–8.0)
PROTEIN: NEGATIVE mg/dL
SPECIFIC GRAVITY, URINE: 1.02 (ref 1.005–1.030)
UROBILINOGEN UA: 0.2 mg/dL (ref 0.0–1.0)

## 2015-03-13 NOTE — Progress Notes (Signed)
Reviewed tip of week with patient  

## 2015-03-13 NOTE — Progress Notes (Signed)
Subjective:  Margaret Keller is a 20 y.o. G3P0200 at [redacted]w[redacted]d being seen today for ongoing prenatal care.  [name], currently pregnant; Supervision of high-risk pregnancy; Cervical insufficiency during pregnancy, antepartum; Late prenatal care affecting pregnancy, antepartum; and Cervical cerclage suture present (placed 11/19/14), antepartum on her problem list.  [name]  Contractions: Not present. Vag. Bleeding: None.  Movement: Present. Denies leaking of fluid.   [name], current medications, past family history, past medical history, past social history, past surgical history and problem list. Problem list updated.  Objective:   Filed Vitals:   03/13/15 1008  BP: 136/63  Pulse: 98  Temp: 98.2 F (36.8 C)  Weight: 160 lb (72.576 kg)    Fetal Status: Fetal Heart Rate (bpm): 165 Fundal Height: 34 cm Movement: Present     General:  Alert, oriented and cooperative. Patient is in no acute distress.  Skin: Skin is warm and dry. No rash noted.   Cardiovascular: Normal heart rate noted  Respiratory: Normal respiratory effort, no problems with respiration noted  Abdomen: Soft, gravid, appropriate for gestational age. Pain/Pressure: Absent     Pelvic: Vag. Bleeding: None    Cervical exam deferred        Extremities: Normal range of motion.  Edema: None  Mental Status: Normal mood and affect. Normal behavior. Normal judgment and thought content.   Urinalysis: Urine Protein: Negative Urine Glucose: Negative  Assessment and Plan:  Pregnancy: G3P0200 at [redacted]w[redacted]d 1. History of preterm delivery, currently pregnant, third trimester Continue weekly 17P, has two injections left.  2. Cervical cerclage suture present (placed 11/19/14), antepartum Removal at 36-37 weeks  3.  Supervision of high-risk pregnancy, third trimester Preterm labor symptoms and general obstetric precautions including but not limited to vaginal bleeding, contractions, leaking of fluid and fetal movement were reviewed in detail with the patient. Please refer to After Visit Summary for other counseling recommendations.  Return for RTC in 1 week for 17P only.  Return in 2 weeks for 17P, OB visit, pelvic cultures, cerclage removal.   Tereso Newcomer, MD

## 2015-03-13 NOTE — Patient Instructions (Signed)
Return to clinic for any obstetric concerns or go to MAU for evaluation  

## 2015-03-20 ENCOUNTER — Encounter (HOSPITAL_COMMUNITY): Payer: Self-pay

## 2015-03-20 ENCOUNTER — Ambulatory Visit: Payer: Medicaid Other

## 2015-03-20 ENCOUNTER — Inpatient Hospital Stay (HOSPITAL_COMMUNITY)
Admission: AD | Admit: 2015-03-20 | Discharge: 2015-03-20 | Disposition: A | Discharge status: Home / Self Care | Payer: Medicaid Other | Source: Ambulatory Visit | Attending: Obstetrics & Gynecology | Admitting: Obstetrics & Gynecology

## 2015-03-20 DIAGNOSIS — O368130 Decreased fetal movements, third trimester, not applicable or unspecified: Secondary | ICD-10-CM | POA: Insufficient documentation

## 2015-03-20 DIAGNOSIS — O4703 False labor before 37 completed weeks of gestation, third trimester: Secondary | ICD-10-CM | POA: Insufficient documentation

## 2015-03-20 DIAGNOSIS — O3430 Maternal care for cervical incompetence, unspecified trimester: Secondary | ICD-10-CM

## 2015-03-20 DIAGNOSIS — O3433 Maternal care for cervical incompetence, third trimester: Secondary | ICD-10-CM | POA: Diagnosis not present

## 2015-03-20 DIAGNOSIS — D696 Thrombocytopenia, unspecified: Secondary | ICD-10-CM | POA: Insufficient documentation

## 2015-03-20 DIAGNOSIS — Z3A35 35 weeks gestation of pregnancy: Secondary | ICD-10-CM | POA: Insufficient documentation

## 2015-03-20 DIAGNOSIS — O479 False labor, unspecified: Secondary | ICD-10-CM

## 2015-03-20 DIAGNOSIS — O99113 Other diseases of the blood and blood-forming organs and certain disorders involving the immune mechanism complicating pregnancy, third trimester: Secondary | ICD-10-CM | POA: Diagnosis not present

## 2015-03-20 DIAGNOSIS — O133 Gestational [pregnancy-induced] hypertension without significant proteinuria, third trimester: Secondary | ICD-10-CM | POA: Insufficient documentation

## 2015-03-20 DIAGNOSIS — O368131 Decreased fetal movements, third trimester, fetus 1: Secondary | ICD-10-CM

## 2015-03-20 DIAGNOSIS — O09213 Supervision of pregnancy with history of pre-term labor, third trimester: Secondary | ICD-10-CM

## 2015-03-20 DIAGNOSIS — O09893 Supervision of other high risk pregnancies, third trimester: Secondary | ICD-10-CM

## 2015-03-20 LAB — CBC
HEMATOCRIT: 35.2 % — AB (ref 36.0–46.0)
HEMOGLOBIN: 12.4 g/dL (ref 12.0–15.0)
MCH: 29.4 pg (ref 26.0–34.0)
MCHC: 35.2 g/dL (ref 30.0–36.0)
MCV: 83.4 fL (ref 78.0–100.0)
Platelets: 141 10*3/uL — ABNORMAL LOW (ref 150–400)
RBC: 4.22 MIL/uL (ref 3.87–5.11)
RDW: 13.8 % (ref 11.5–15.5)
WBC: 13.4 10*3/uL — ABNORMAL HIGH (ref 4.0–10.5)

## 2015-03-20 LAB — COMPREHENSIVE METABOLIC PANEL
ALK PHOS: 130 U/L — AB (ref 38–126)
ALT: 15 U/L (ref 14–54)
ANION GAP: 6 (ref 5–15)
AST: 26 U/L (ref 15–41)
Albumin: 3.1 g/dL — ABNORMAL LOW (ref 3.5–5.0)
BILIRUBIN TOTAL: 0.4 mg/dL (ref 0.3–1.2)
BUN: 5 mg/dL — ABNORMAL LOW (ref 6–20)
CO2: 21 mmol/L — ABNORMAL LOW (ref 22–32)
Calcium: 8.6 mg/dL — ABNORMAL LOW (ref 8.9–10.3)
Chloride: 109 mmol/L (ref 101–111)
Creatinine, Ser: 0.51 mg/dL (ref 0.44–1.00)
GLUCOSE: 110 mg/dL — AB (ref 65–99)
Potassium: 3.7 mmol/L (ref 3.5–5.1)
Sodium: 136 mmol/L (ref 135–145)
TOTAL PROTEIN: 6.7 g/dL (ref 6.5–8.1)

## 2015-03-20 LAB — URINALYSIS, DIPSTICK ONLY
Bilirubin Urine: NEGATIVE
GLUCOSE, UA: NEGATIVE mg/dL
Ketones, ur: NEGATIVE mg/dL
Nitrite: NEGATIVE
PH: 6 (ref 5.0–8.0)
Protein, ur: NEGATIVE mg/dL
Specific Gravity, Urine: 1.01 (ref 1.005–1.030)

## 2015-03-20 LAB — PROTEIN / CREATININE RATIO, URINE
Creatinine, Urine: 87 mg/dL
PROTEIN CREATININE RATIO: 0.29 mg/mg{creat} — AB (ref 0.00–0.15)
TOTAL PROTEIN, URINE: 25 mg/dL

## 2015-03-20 NOTE — MAU Note (Signed)
Pt states she hasn't felt the baby move since last night around 1830. Pt was lying down most of the day. Pt ate normally today and the last time was 1800. Pt denies bleeding and leaking of fluid.

## 2015-03-20 NOTE — MAU Provider Note (Signed)
Chief Complaint:  Decreased Fetal Movement   First Provider Initiated Contact with Patient 03/20/15 2006     HPI: Margaret Keller is a 20 y.o. G3P0200 at [redacted]w[redacted]d who presents to maternity admissions reporting no fetal mvmt since last night. Elevated BP noted in triage. No Hx of HTN.   Duration: ~24 hours Context: none Timing: Constant Modifying factors: No improvement w/ eating and drinking. Associated signs and symptoms: Neg for fever, chills, abd pain, VB or LOF.    Pregnancy Course: Pregnancy significant for PTD w/ fetal/neonatal demise x 2 due to cervical insufficiency. Cerclage in place.   Past Medical History: Past Medical History  Diagnosis Date  . Preterm labor     Past obstetric history: OB History  Gravida Para Term Preterm AB SAB TAB Ectopic Multiple Living  0 0    # Outcome Date GA Lbr Len/2nd Weight Sex Delivery Anes PTL Lv  3 Current           2 Preterm 05/31/14 [redacted]w[redacted]d 08:50 / 01:42 1 lb 2.4 oz (0.522 kg) M Vag-Spont None  ND     Comments: 22 weeks. Eyes fused.  1 Preterm 10/23/13 [redacted]w[redacted]d 04:31 / 00:02 12.2 oz (0.346 kg) M Vag-Spont None  FD      Past Surgical History: Past Surgical History  Procedure Laterality Date  . Cervical cerclage N/A 11/19/2014    Procedure: CERCLAGE CERVICAL;  Surgeon: Tereso Newcomer, MD;  Location: [facility];  Service: Gynecology;  Laterality: N/A;     Family History: Family History  Problem Relation Age of Onset  . Hypertension Mother   . Diabetes Father     Social History: Social History  Substance Use Topics  . Smoking status: Never Smoker   . Smokeless tobacco: Never Used  . Alcohol Use: No    Allergies: No Known Allergies  Meds:  Facility-administered medications prior to admission  Medication Dose Route Frequency Provider Last Rate Last Dose  . hydroxyprogesterone caproate (DELALUTIN) 250 mg/mL injection 250 mg  250 mg Intramuscular Weekly Ugonna A Anyanwu, MD   250 mg at 03/13/15 1014   Prescriptions  prior to admission  Medication Sig Dispense Refill Last Dose  . acetaminophen (TYLENOL) 500 MG tablet Take 1,000 mg by mouth every 6 (six) hours as needed for moderate pain.   Past Week at Unknown time  . Prenatal Vit-Fe Fumarate-FA (PRENATAL MULTIVITAMIN) TABS tablet Take 1 tablet by mouth daily at 12 noon.   03/19/2015 at Unknown time    I have reviewed patient's Past Medical Hx, Surgical Hx, Family Hx, Social Hx, medications and allergies.   ROS:  Review of Systems  Constitutional: Negative for fever and chills.  Eyes: Negative for visual disturbance.  Gastrointestinal: Negative for abdominal pain.  Genitourinary: Negative for vaginal bleeding.       Neg for LOF  Neurological: Negative for headaches.    Physical Exam  Patient Vitals for the past 24 hrs:  BP Temp Temp src Pulse Resp Height Weight  03/20/15 2033 128/77 mmHg - - 113 - - -  03/20/15 2018 141/80 mmHg - - 113 - - -  03/20/15 2003 146/85 mmHg - - 111 - - -  03/20/15 2000 - - - - 18 - -  03/20/15 1956 147/88 mmHg - - 109 - - -  03/20/15 1945 143/82 mmHg 99.5 F (37.5 C) Oral 108 18  (1.651 m) 160 lb (72.576 kg)   Constitutional: Well-developed, well-nourished  female in no acute distress.  Cardiovascular: Mild tachycardia Respiratory: normal effort GI: Abd soft, non-tender, gravid appropriate for gestational age.  MS: Extremities nontender, no edema, normal ROM. 1+ pedal edema Neurologic: Alert and oriented x 4. DTRs 2+. No clonus. GU: Dilation: Closed Effacement (%): 50 Cervical Position: Posterior Station:  (high) Exam by:: v Raissa Dam,cnm  FHT:  Baseline 150 , moderate variability, 15 x 15 accelerations present, no decelerations Contractions: q 2-6 mins, mild   Labs: Results for orders placed or performed during the hospital encounter of 03/20/15 (from the past 24 hour(s))  Urinalysis, dipstick only     Status: Abnormal   Collection Time: 03/20/15  7:30 PM  Result Value Ref Range   Color, Urine YELLOW  YELLOW   APPearance CLEAR CLEAR   Specific Gravity, Urine 1.010 1.005 - 1.030   pH 6.0 5.0 - 8.0   Glucose, UA NEGATIVE NEGATIVE mg/dL   Hgb urine dipstick TRACE (A) NEGATIVE   Bilirubin Urine NEGATIVE NEGATIVE   Ketones, ur NEGATIVE NEGATIVE mg/dL   Protein, ur NEGATIVE NEGATIVE mg/dL   Nitrite NEGATIVE NEGATIVE   Leukocytes, UA TRACE (A) NEGATIVE  Protein / creatinine ratio, urine     Status: Abnormal   Collection Time: 03/20/15  7:30 PM  Result Value Ref Range   Creatinine, Urine 87.00 mg/dL   Total Protein, Urine 25 mg/dL   Protein Creatinine Ratio 0.29 (H) 0.00 - 0.15 mg/mg[Cre]  CBC     Status: Abnormal   Collection Time: 03/20/15  8:12 PM  Result Value Ref Range   WBC 13.4 (H) 4.0 - 10.5 K/uL   RBC 4.22 3.87 - 5.11 MIL/uL   Hemoglobin 12.4 12.0 - 15.0 g/dL   HCT 16.1 (L) 09.6 - 04.5 %   MCV 83.4 78.0 - 100.0 fL   MCH 29.4 26.0 - 34.0 pg   MCHC 35.2 30.0 - 36.0 g/dL   RDW 40.9 81.1 - 91.4 %   Platelets 141 (L) 150 - 400 K/uL  Comprehensive metabolic panel     Status: Abnormal   Collection Time: 03/20/15  8:12 PM  Result Value Ref Range   Sodium 136 135 - 145 mmol/L   Potassium 3.7 3.5 - 5.1 mmol/L   Chloride 109 101 - 111 mmol/L   CO2 21 (L) 22 - 32 mmol/L   Glucose, Bld 110 (H) 65 - 99 mg/dL   BUN 5 (L) 6 - 20 mg/dL   Creatinine, Ser 7.82 0.44 - 1.00 mg/dL   Calcium 8.6 (L) 8.9 - 10.3 mg/dL   Total Protein 6.7 6.5 - 8.1 g/dL   Albumin 3.1 (L) 3.5 - 5.0 g/dL   AST 26 15 - 41 U/L   ALT 15 14 - 54 U/L   Alkaline Phosphatase 130 (H) 38 - 126 U/L   Total Bilirubin 0.4 0.3 - 1.2 mg/dL   GFR calc non Af Amer >60 >60 mL/min   GFR calc Af Amer >60 >60 mL/min   Anion gap 6 5 - 15    Imaging:  No results found.  MAU Course: CBC, CMET, UA, P:C, Cycle BPs.  Mild thrombocytopenia C/W pt's prior CBCs. Discussed No FM, Hx, exam, elevated BP's, labs including Plt 141 w/ Dr. Penne Lash. Pt needs to start antenatal testing and have growth [place] tomorrow. In-basket message sent  to clinic.   MDM: - Decreased FM, but FHR reactive and fetal mvmt palpable. Pt may be less able to feel FM during frequent Braxton Hicks.  - Transient HTN of pregnancy.  No evidence of pre-E.   Assessment: 1. Gestational hypertension without significant proteinuria in third trimester   2. Cervical cerclage suture present, antepartum   3. History of preterm delivery, currently pregnant, third trimester   4. Cervical insufficiency during pregnancy, antepartum, third trimester   5. Braxton Hicks contractions   6. Decreased fetal movement in pregnancy in third trimester, antepartum, fetus 1     Plan: Discharge home in stable condition.  Per Dr. Penne LashLeggett pt needs to start antenatal testing and have growth US tomorrow. In-basket message sent to clinic.  Preterm labor and Pre-E precautions and fetal kick counts Increase fluids and rest.  Follow-up Information    Follow up with [facility].   Specialty:  Maternal and Fetal Medicine   Why:  will call you to schedule ultrasound tomorrow.    Contact information:   [address] [id number] mc [place] [id number] [phone]      [facility] with [facility].   Specialty:  Obstetrics and Gynecology   Why:  To schedule testing twice a week    Contact information:   [address] [place] [id number] [phone]      Follow [facility] with [facility].   Why:  As needed in emergencies   Contact information:   [address] [id number] mc [place] [id number] [phone]        Medication List    TAKE these medications        acetaminophen 500 MG tablet  Commonly known as:  TYLENOL  Take 1,000 mg by mouth every 6 (six) hours as needed for moderate pain.     prenatal multivitamin Tabs tablet  Take 1 tablet by mouth daily at 12 noon.        Peterson[place]ia Jahaira Earnhart, CNM 03/20/2015 9:22 PM

## 2015-03-20 NOTE — Discharge Instructions (Signed)
Hypertension During Pregnancy °Hypertension, or high blood pressure, is when there is extra pressure inside your blood vessels that carry blood from the heart to the rest of your body (arteries). It can happen at any time in life, including pregnancy. Hypertension during pregnancy can cause problems for you and your baby. Your baby might not weigh as much as he or she should at birth or might be born early (premature). Very bad cases of hypertension during pregnancy can be life-threatening.  °Different types of hypertension can occur during pregnancy. These include: °· Chronic hypertension. This happens when a woman has hypertension before pregnancy and it continues during pregnancy. °· Gestational hypertension. This is when hypertension develops during pregnancy. °· Preeclampsia or toxemia of pregnancy. This is a very serious type of hypertension that develops only during pregnancy. It affects the whole body and can be very dangerous for both mother and baby.   °Gestational hypertension and preeclampsia usually go away after your baby is born. Your blood pressure will likely stabilize within 6 weeks. Women who have hypertension during pregnancy have a greater chance of developing hypertension later in life or with future pregnancies. °RISK FACTORS °There are certain factors that make it more likely for you to develop hypertension during pregnancy. These include: °· Having hypertension before pregnancy. °· Having hypertension during a previous pregnancy. °· Being overweight. °· Being older than 40 years. °· Being pregnant with more than one baby. °· Having diabetes or kidney problems. °SIGNS AND SYMPTOMS °Chronic and gestational hypertension rarely cause symptoms. Preeclampsia has symptoms, which may include: °· Increased protein in your urine. Your health care provider will check for this at every prenatal visit. °· Swelling of your hands and face. °· Rapid weight gain. °· Headaches. °· Visual changes. °· Being  bothered by light. °· Abdominal pain, especially in the upper right area. °· Chest pain. °· Shortness of breath. °· Increased reflexes. °· Seizures. These occur with a more severe form of preeclampsia, called eclampsia. °DIAGNOSIS  °You may be diagnosed with hypertension during a regular prenatal exam. At each prenatal visit, you may have: °· Your blood pressure checked. °· A urine test to check for protein in your urine. °The type of hypertension you are diagnosed with depends on when you developed it. It also depends on your specific blood pressure reading. °· Developing hypertension before 20 weeks of pregnancy is consistent with chronic hypertension. °· Developing hypertension after 20 weeks of pregnancy is consistent with gestational hypertension. °· Hypertension with increased urinary protein is diagnosed as preeclampsia. °· Blood pressure measurements that stay above 160 systolic or 110 diastolic are a sign of severe preeclampsia. °TREATMENT °Treatment for hypertension during pregnancy varies. Treatment depends on the type of hypertension and how serious it is. °· If you take medicine for chronic hypertension, you may need to switch medicines. °¨ Medicines called ACE inhibitors should not be taken during pregnancy. °¨ Low-dose aspirin may be suggested for women who have risk factors for preeclampsia. °· If you have gestational hypertension, you may need to take a blood pressure medicine that is safe during pregnancy. Your health care provider will recommend the correct medicine. °· If you have severe preeclampsia, you may need to be in the hospital. Health care providers will watch you and your baby very closely. You also may need to take medicine called magnesium sulfate to prevent seizures and lower blood pressure. °· Sometimes, an early delivery is needed. This may be the case if the condition worsens. It would be   done to protect you and your baby. The only cure for preeclampsia is delivery.  Your health  care provider may recommend that you take one low-dose aspirin (81 mg) each day to help prevent high blood pressure during your pregnancy if you are at risk for preeclampsia. You may be at risk for preeclampsia if:  You had preeclampsia or eclampsia during a previous pregnancy.  Your baby did not grow as expected during a previous pregnancy.  You experienced preterm birth with a previous pregnancy.  You experienced a separation of the placenta from the uterus (placental abruption) during a previous pregnancy.  You experienced the loss of your baby during a previous pregnancy.  You are pregnant with more than one baby.  You have other medical conditions, such as diabetes or an autoimmune disease. HOME CARE INSTRUCTIONS  Schedule and keep all of your regular prenatal care appointments. This is important.  Take medicines only as directed by your health care provider. Tell your health care provider about all medicines you take.  Eat as little salt as possible.  Get regular exercise.  Do not drink alcohol.  Do not use tobacco products.  Do not drink products with caffeine.  Lie on your left side when resting. SEEK IMMEDIATE MEDICAL CARE IF:  You have severe abdominal pain.  You have sudden swelling in your hands, ankles, or face.  You gain 4 pounds (1.8 kg) or more in 1 week.  You vomit repeatedly.  You have vaginal bleeding.  You do not feel your baby moving as much.  You have a headache.  You have blurred or double vision.  You have muscle twitching or spasms.  You have shortness of breath.  You have blue fingernails or lips.  You have blood in your urine. MAKE SURE YOU:  Understand these instructions.  Will watch your condition.  Will get help right away if you are not doing well or get worse.   This information is not intended to replace advice given to you by your health care provider. Make sure you discuss any questions you have with your health care  provider.   Document Released: 09/15/2010 Document Revised: 01/18/2014 Document Reviewed: 07/27/2012 Elsevier Interactive Patient Education 2016 [employer]  24-Hour Urine Collection HOW DO I DO A 24-HOUR URINE COLLECTION?  When you get up in the morning, urinate in the toilet and flush. Write down the time. This will be your start time on the day of collection and your end time on the next morning.  From then on, collect all of your urine in the plastic jug that is given to you.  Stop collecting your urine 24 hours after you started.  If the plastic jug that is given to you already has liquid in it, that is okay. Do not throw out the liquid or rinse out the jug. Some tests need the liquid to be added to your urine.  Keep your plastic jug cool in an ice chest or keep it in the refrigerator during the test.  When 24 hours are over, bring your plastic jug to the clinic lab. Keep the jug cool in an ice chest while you are bringing it to the lab.   This information is not intended to replace advice given to you by your health care provider. Make sure you discuss any questions you have with your health care provider.   Document Released: 03/26/2008 Document Revised: 01/18/2014 Document Reviewed: 05/23/2013 Elsevier Interactive Patient Education [employer] Elsevier Inc.

## 2015-03-21 ENCOUNTER — Encounter: Payer: Self-pay | Admitting: *Deleted

## 2015-03-21 ENCOUNTER — Telehealth: Payer: Self-pay | Admitting: General Practice

## 2015-03-21 DIAGNOSIS — O133 Gestational [pregnancy-induced] hypertension without significant proteinuria, third trimester: Secondary | ICD-10-CM

## 2015-03-21 DIAGNOSIS — O1493 Unspecified pre-eclampsia, third trimester: Secondary | ICD-10-CM | POA: Insufficient documentation

## 2015-03-21 NOTE — Telephone Encounter (Signed)
Per Dorathy KinsmanVirginia Keller, patient needs urgent ultrasound for growth & start testing. Scheduled u/s for 3/14 @ 8am in MFM and clinic appt immediately after for NST. Called patient, no answer- left message stating we are trying to reach you with upcoming appt information, please call us back at the clinics

## 2015-03-22 ENCOUNTER — Other Ambulatory Visit: Payer: Self-pay | Admitting: Advanced Practice Midwife

## 2015-03-22 DIAGNOSIS — O133 Gestational [pregnancy-induced] hypertension without significant proteinuria, third trimester: Secondary | ICD-10-CM

## 2015-03-22 LAB — CREATININE CLEARANCE, URINE, 24 HOUR
CREAT CLEAR: 176 mL/min — AB (ref 75–115)
CREATININE 24H UR: 1294 mg/d (ref 600–1800)
Collection Interval-CRCL: 24 hours
Creatinine, Urine: 60.2 mg/dL
URINE TOTAL VOLUME-CRCL: 2150 mL

## 2015-03-22 LAB — PROTEIN, URINE, 24 HOUR
COLLECTION INTERVAL-UPROT: 24 h
Protein, 24H Urine: 366 mg/d — ABNORMAL HIGH (ref 50–100)
Protein, Urine: 17 mg/dL
URINE TOTAL VOLUME-UPROT: 2150 mL

## 2015-03-24 ENCOUNTER — Other Ambulatory Visit: Payer: Medicaid Other

## 2015-03-24 ENCOUNTER — Encounter (HOSPITAL_COMMUNITY): Payer: Self-pay | Admitting: *Deleted

## 2015-03-24 ENCOUNTER — Inpatient Hospital Stay (HOSPITAL_COMMUNITY)
Admission: AD | Admit: 2015-03-24 | Discharge: 2015-03-28 | DRG: 775 | Disposition: A | Discharge status: Home / Self Care | Payer: Medicaid Other | Source: Ambulatory Visit | Attending: Obstetrics & Gynecology | Admitting: Obstetrics & Gynecology

## 2015-03-24 DIAGNOSIS — O09213 Supervision of pregnancy with history of pre-term labor, third trimester: Secondary | ICD-10-CM

## 2015-03-24 DIAGNOSIS — K219 Gastro-esophageal reflux disease without esophagitis: Secondary | ICD-10-CM | POA: Diagnosis present

## 2015-03-24 DIAGNOSIS — Z833 Family history of diabetes mellitus: Secondary | ICD-10-CM | POA: Diagnosis not present

## 2015-03-24 DIAGNOSIS — Z8249 Family history of ischemic heart disease and other diseases of the circulatory system: Secondary | ICD-10-CM

## 2015-03-24 DIAGNOSIS — O1410 Severe pre-eclampsia, unspecified trimester: Secondary | ICD-10-CM | POA: Diagnosis present

## 2015-03-24 DIAGNOSIS — O3430 Maternal care for cervical incompetence, unspecified trimester: Secondary | ICD-10-CM

## 2015-03-24 DIAGNOSIS — O0930 Supervision of pregnancy with insufficient antenatal care, unspecified trimester: Secondary | ICD-10-CM

## 2015-03-24 DIAGNOSIS — O3433 Maternal care for cervical incompetence, third trimester: Secondary | ICD-10-CM | POA: Diagnosis present

## 2015-03-24 DIAGNOSIS — O99824 Streptococcus B carrier state complicating childbirth: Secondary | ICD-10-CM | POA: Diagnosis present

## 2015-03-24 DIAGNOSIS — O09893 Supervision of other high risk pregnancies, third trimester: Secondary | ICD-10-CM

## 2015-03-24 DIAGNOSIS — O368130 Decreased fetal movements, third trimester, not applicable or unspecified: Secondary | ICD-10-CM | POA: Diagnosis present

## 2015-03-24 DIAGNOSIS — Z3A36 36 weeks gestation of pregnancy: Secondary | ICD-10-CM

## 2015-03-24 DIAGNOSIS — O09219 Supervision of pregnancy with history of pre-term labor, unspecified trimester: Secondary | ICD-10-CM

## 2015-03-24 DIAGNOSIS — O9962 Diseases of the digestive system complicating childbirth: Secondary | ICD-10-CM | POA: Diagnosis present

## 2015-03-24 DIAGNOSIS — O1493 Unspecified pre-eclampsia, third trimester: Secondary | ICD-10-CM

## 2015-03-24 DIAGNOSIS — R079 Chest pain, unspecified: Secondary | ICD-10-CM

## 2015-03-24 DIAGNOSIS — O0932 Supervision of pregnancy with insufficient antenatal care, second trimester: Secondary | ICD-10-CM

## 2015-03-24 DIAGNOSIS — O0990 Supervision of high risk pregnancy, unspecified, unspecified trimester: Secondary | ICD-10-CM

## 2015-03-24 DIAGNOSIS — O1414 Severe pre-eclampsia complicating childbirth: Secondary | ICD-10-CM | POA: Diagnosis present

## 2015-03-24 DIAGNOSIS — O09899 Supervision of other high risk pregnancies, unspecified trimester: Secondary | ICD-10-CM

## 2015-03-24 HISTORY — DX: Maternal care for cervical incompetence, unspecified trimester: O34.30

## 2015-03-24 HISTORY — DX: Other specified postprocedural states: Z98.890

## 2015-03-24 LAB — CBC
HEMATOCRIT: 35.8 % — AB (ref 36.0–46.0)
HEMATOCRIT: 36.2 % (ref 36.0–46.0)
Hemoglobin: 12.3 g/dL (ref 12.0–15.0)
Hemoglobin: 12.5 g/dL (ref 12.0–15.0)
MCH: 28.5 pg (ref 26.0–34.0)
MCH: 28.5 pg (ref 26.0–34.0)
MCHC: 34.4 g/dL (ref 30.0–36.0)
MCHC: 34.5 g/dL (ref 30.0–36.0)
MCV: 83.1 fL (ref 78.0–100.0)
MCV: 82.5 fL (ref 78.0–100.0)
Platelets: 133 10*3/uL — ABNORMAL LOW (ref 150–400)
Platelets: 135 10*3/uL — ABNORMAL LOW (ref 150–400)
RBC: 4.31 MIL/uL (ref 3.87–5.11)
RBC: 4.39 MIL/uL (ref 3.87–5.11)
RDW: 13.6 % (ref 11.5–15.5)
RDW: 13.6 % (ref 11.5–15.5)
WBC: 12.5 10*3/uL — AB (ref 4.0–10.5)
WBC: 15.5 10*3/uL — ABNORMAL HIGH (ref 4.0–10.5)

## 2015-03-24 LAB — COMPREHENSIVE METABOLIC PANEL
ALBUMIN: 3.1 g/dL — AB (ref 3.5–5.0)
ALK PHOS: 139 U/L — AB (ref 38–126)
ALT: 16 U/L (ref 14–54)
AST: 23 U/L (ref 15–41)
Anion gap: 5 (ref 5–15)
BILIRUBIN TOTAL: 0.5 mg/dL (ref 0.3–1.2)
CALCIUM: 8.3 mg/dL — AB (ref 8.9–10.3)
CO2: 18 mmol/L — ABNORMAL LOW (ref 22–32)
CREATININE: 0.52 mg/dL (ref 0.44–1.00)
Chloride: 110 mmol/L (ref 101–111)
GFR calc Af Amer: >60 mL/min (ref >60)
GLUCOSE: 81 mg/dL (ref 65–99)
POTASSIUM: 3.7 mmol/L (ref 3.5–5.1)
Sodium: 133 mmol/L — ABNORMAL LOW (ref 135–145)
TOTAL PROTEIN: 7.1 g/dL (ref 6.5–8.1)

## 2015-03-24 LAB — TYPE AND SCREEN
ABO/RH(D): O POS
ANTIBODY SCREEN: NEGATIVE

## 2015-03-24 LAB — PROTEIN / CREATININE RATIO, URINE
CREATININE, URINE: 51 mg/dL
Protein Creatinine Ratio: 0.33 mg/mg{Cre} — ABNORMAL HIGH (ref 0.00–0.15)
Total Protein, Urine: 17 mg/dL

## 2015-03-24 MED ORDER — LACTATED RINGERS IV SOLN: 500.0000 mL | INTRAVENOUS | Status: DC | PRN

## 2015-03-24 MED ORDER — TERBUTALINE SULFATE 1 MG/ML IJ SOLN: 0.2500 mg | Freq: Once | INTRAMUSCULAR | Status: DC | PRN

## 2015-03-24 MED ORDER — ACETAMINOPHEN 325 MG PO TABS
650.0000 mg | ORAL_TABLET | ORAL | Status: DC | PRN
  Administered 2015-03-25: 650 mg via ORAL
  Filled 2015-03-24: qty 2

## 2015-03-24 MED ORDER — CITRIC ACID-SODIUM CITRATE 334-500 MG/5ML PO SOLN: 30.0000 mL | ORAL | Status: DC | PRN

## 2015-03-24 MED ORDER — MAGNESIUM SULFATE BOLUS VIA INFUSION
4.0000 g | Freq: Once | INTRAVENOUS | Status: AC
  Administered 2015-03-24: 4 g via INTRAVENOUS
  Filled 2015-03-24: qty 500

## 2015-03-24 MED ORDER — FLEET ENEMA 7-19 GM/118ML RE ENEM: 1.0000 | ENEMA | RECTAL | Status: DC | PRN

## 2015-03-24 MED ORDER — PRENATAL MULTIVITAMIN CH: 1.0000 | ORAL_TABLET | Freq: Every day | ORAL | Status: DC

## 2015-03-24 MED ORDER — FENTANYL CITRATE (PF) 100 MCG/2ML IJ SOLN
100.0000 ug | INTRAMUSCULAR | Status: DC | PRN
Start: 2015-03-24 — End: 2015-03-26
  Administered 2015-03-25 (×4): 100 ug via INTRAVENOUS
  Filled 2015-03-24 (×4): qty 2

## 2015-03-24 MED ORDER — LACTATED RINGERS IV SOLN
INTRAVENOUS | Status: DC
  Administered 2015-03-24 – 2015-03-26 (×5): via INTRAVENOUS

## 2015-03-24 MED ORDER — METOCLOPRAMIDE HCL 10 MG PO TABS
10.0000 mg | ORAL_TABLET | Freq: Once | ORAL | Status: AC
  Administered 2015-03-24: 10 mg via ORAL
  Filled 2015-03-24: qty 1

## 2015-03-24 MED ORDER — OXYCODONE-ACETAMINOPHEN 5-325 MG PO TABS: 2.0000 | ORAL_TABLET | ORAL | Status: DC | PRN

## 2015-03-24 MED ORDER — PENICILLIN G POTASSIUM 5000000 UNITS IJ SOLR
5.0000 10*6.[IU] | Freq: Once | INTRAVENOUS | Status: AC
  Administered 2015-03-24: 5 10*6.[IU] via INTRAVENOUS
  Filled 2015-03-24: qty 5

## 2015-03-24 MED ORDER — OXYTOCIN BOLUS FROM INFUSION: 500.0000 mL | INTRAVENOUS | Status: DC

## 2015-03-24 MED ORDER — MAGNESIUM SULFATE 50 % IJ SOLN
2.0000 g/h | INTRAVENOUS | Status: DC
  Administered 2015-03-24 – 2015-03-26 (×3): 2 g/h via INTRAVENOUS
  Filled 2015-03-24 (×3): qty 80

## 2015-03-24 MED ORDER — BETAMETHASONE SOD PHOS & ACET 6 (3-3) MG/ML IJ SUSP
12.0000 mg | INTRAMUSCULAR | Status: AC
  Administered 2015-03-24 – 2015-03-25 (×2): 12 mg via INTRAMUSCULAR
  Filled 2015-03-24 (×2): qty 2

## 2015-03-24 MED ORDER — PENICILLIN G POTASSIUM 5000000 UNITS IJ SOLR
2.5000 10*6.[IU] | INTRAVENOUS | Status: DC
  Administered 2015-03-25 – 2015-03-26 (×10): 2.5 10*6.[IU] via INTRAVENOUS
  Filled 2015-03-24 (×14): qty 2.5

## 2015-03-24 MED ORDER — ONDANSETRON HCL 4 MG/2ML IJ SOLN: 4.0000 mg | Freq: Four times a day (QID) | INTRAMUSCULAR | Status: DC | PRN

## 2015-03-24 MED ORDER — OXYTOCIN 10 UNIT/ML IJ SOLN
2.5000 [IU]/h | INTRAVENOUS | Status: DC
  Administered 2015-03-26: 39.96 [IU]/h via INTRAVENOUS

## 2015-03-24 MED ORDER — LIDOCAINE HCL (PF) 1 % IJ SOLN
30.0000 mL | INTRAMUSCULAR | Status: AC | PRN
  Administered 2015-03-26: 30 mL via SUBCUTANEOUS
  Filled 2015-03-24: qty 30

## 2015-03-24 MED ORDER — OXYCODONE-ACETAMINOPHEN 5-325 MG PO TABS
1.0000 | ORAL_TABLET | ORAL | Status: DC | PRN
Start: 2015-03-24 — End: 2015-03-26

## 2015-03-24 NOTE — MAU Provider Note (Signed)
Chief Complaint:  Abdominal Pain; Back Pain; and Decreased Fetal Movement   First Provider Initiated Contact with Patient 03/24/15 1739      Margaret Keller is a 20 y.o. G3P0200 at 2936w0dwho presents to maternity admissions reporting sharp RUQ pain and no fetal movement.  Also has some pain in her lower back.  Feels contractions as tightening but not painful  Has had headache  ("7") since this morning, unrelieved by Tylenol. She reports decreased fetal movement, denies LOF, vaginal bleeding, vaginal itching/burning, urinary symptoms,  dizziness, n/v, diarrhea, constipation or fever/chills.  She denies visual changes.  She was recently worked up for transient hypertension, platelets were low and 24 hr urine was elevated  Abdominal Pain This is a new problem. The current episode started today. The onset quality is gradual. The problem occurs intermittently. The problem has been unchanged. The pain is located in the RUQ. The pain is mild. The quality of the pain is cramping and sharp. The abdominal pain does not radiate. Associated symptoms include headaches. Pertinent negatives include no anorexia, constipation, diarrhea, dysuria, fever, frequency, myalgias, nausea or vomiting. Nothing aggravates the pain. The pain is relieved by nothing. She has tried nothing for the symptoms.   RN Note:  Expand All Collapse All   Pain started yesterday, stabbing pains in RUQ- unchanged but continues. Pain in low back. No movement since last night           Past Medical History: Past Medical History  Diagnosis Date  . Preterm labor     Past obstetric history: OB History  Gravida Para Term Preterm AB SAB TAB Ectopic Multiple Living  3 2  2      0 0    # Outcome Date GA Lbr Len/2nd Weight Sex Delivery Anes PTL Lv  3 Current           2 Preterm 05/31/14 [id number] 08:50 / 01:42 1 lb 2.4 oz (0.522 kg) M Vag-Spont None  ND     Comments: 22 weeks. Eyes fused.  1 Preterm 10/23/13 [id number] 04:31 / 00:02 12.2 oz  (0.346 kg) M Vag-Spont None  FD      Past Surgical History: Past Surgical History  Procedure Laterality Date  . Cervical cerclage N/A 11/19/2014    Procedure: CERCLAGE CERVICAL;  Surgeon: Tereso NewcomerUgonna A Anyanwu, MD;  Location: [facility];  Service: Gynecology;  Laterality: N/A;    Family History: Family History  Problem Relation Age of Onset  . Hypertension Mother   . Diabetes Father     Social History: Social History  Substance Use Topics  . Smoking status: Never Smoker   . Smokeless tobacco: Never Used  . Alcohol Use: No    Allergies: No Known Allergies  Meds:  Facility-administered medications prior to admission  Medication Dose Route Frequency Provider Last Rate Last Dose  . hydroxyprogesterone caproate (DELALUTIN) 250 mg/mL injection 250 mg  250 mg Intramuscular Weekly Ugonna A Anyanwu, MD   250 mg at 03/13/15 1014   Prescriptions prior to admission  Medication Sig Dispense Refill Last Dose  . acetaminophen (TYLENOL) 500 MG tablet Take 1,000 mg by mouth every 6 (six) hours as needed for moderate pain.   03/24/2015 at Unknown time  . Prenatal Vit-Fe Fumarate-FA (PRENATAL MULTIVITAMIN) TABS tablet Take 1 tablet by mouth daily at 12 noon.   Past Week at Unknown time    I have reviewed patient's Past Medical Hx, Surgical Hx, Family Hx, Social Hx, medications and allergies.   ROS:  Review  of Systems  Constitutional: Negative for fever, chills and fatigue.  HENT: Negative for sinus pressure and sore throat.   Respiratory: Negative for shortness of breath.   Cardiovascular: Positive for leg swelling.  Gastrointestinal: Positive for abdominal pain. Negative for nausea, vomiting, diarrhea, constipation and anorexia.  Genitourinary: Negative for dysuria, frequency, vaginal bleeding, vaginal discharge and pelvic pain.  Musculoskeletal: Positive for back pain. Negative for myalgias.  Neurological: Positive for headaches. Negative for dizziness, speech difficulty and weakness.   Other  systems negative  Physical Exam  Patient Vitals for the past 24 hrs:  BP Temp Temp src Pulse Resp Weight  03/24/15 1733 143/75 mmHg - - 115 - -  03/24/15 1716 147/76 mmHg 97.5 F (36.4 C) Oral 114 20 163 lb 6.4 oz (74.118 kg)   Constitutional: Well-developed, well-nourished female in no acute distress.  Cardiovascular: normal rate and rhythm Respiratory: normal effort, clear to auscultation bilaterally GI: Abd soft, non-tender, gravid appropriate for gestational age.   No rebound or guarding. MS: Extremities nontender, trace to 1+ edema, normal ROM Neurologic: Alert and oriented x 4.  DTRs brisk GU: Neg CVAT.  Cervix thinned and dilated to 1cm. Stitch is still intact. Dilation: 1 Effacement (%): 80 Station: -1 Presentation: Vertex Exam by:: Artelia Laroche, CNM     FHT:  Baseline 135 , moderate variability, accelerations present, no decelerations Contractions:  Irregular but frequent.   Labs: Results for orders placed or performed during the hospital encounter of 03/24/15 (from the past 24 hour(s))  CBC     Status: Abnormal   Collection Time: 03/24/15  6:19 PM  Result Value Ref Range   WBC 12.5 (H) 4.0 - 10.5 K/uL   RBC 4.31 3.87 - 5.11 MIL/uL   Hemoglobin 12.3 12.0 - 15.0 g/dL   HCT 78.2 (L) 95.6 - 21.3 %   MCV 83.1 78.0 - 100.0 fL   MCH 28.5 26.0 - 34.0 pg   MCHC 34.4 30.0 - 36.0 g/dL   RDW 08.6 57.8 - 46.9 %   Platelets 133 (L) 150 - 400 K/uL  Comprehensive metabolic panel     Status: Abnormal   Collection Time: 03/24/15  6:19 PM  Result Value Ref Range   Sodium 133 (L) 135 - 145 mmol/L   Potassium 3.7 3.5 - 5.1 mmol/L   Chloride 110 101 - 111 mmol/L   CO2 18 (L) 22 - 32 mmol/L   Glucose, Bld 81 65 - 99 mg/dL   BUN <5 (L) 6 - 20 mg/dL   Creatinine, Ser 6.29 0.44 - 1.00 mg/dL   Calcium 8.3 (L) 8.9 - 10.3 mg/dL   Total Protein 7.1 6.5 - 8.1 g/dL   Albumin 3.1 (L) 3.5 - 5.0 g/dL   AST 23 15 - 41 U/L   ALT 16 14 - 54 U/L   Alkaline Phosphatase 139 (H) 38 - 126 U/L    Total Bilirubin 0.5 0.3 - 1.2 mg/dL   GFR calc non Af Amer >60 >60 mL/min   GFR calc Af Amer >60 >60 mL/min   Anion gap 5 5 - 15  Protein / creatinine ratio, urine     Status: Abnormal   Collection Time: 03/24/15  6:35 PM  Result Value Ref Range   Creatinine, Urine 51.00 mg/dL   Total Protein, Urine 17 mg/dL   Protein Creatinine Ratio 0.33 (H) 0.00 - 0.15 mg/mg[Cre]    Imaging:  No results found.  MAU Course/MDM: I have ordered labs and reviewed results.  NST reviewed Consult  Dr Adrian Blackwater with presentation, exam findings and test results.   Discussed labile hypertension, somewhat severe headache, upper abdominal pain, decreasing platelets, and rise in urinary protein.  Meets classification of severe preeclampsia.    Assessment: 1. Proteinuric hypertension of pregnancy in third trimester, antepartum   2. Cervical cerclage suture present, antepartum   3. Late prenatal care affecting pregnancy, antepartum, second trimester   4. History of preterm delivery, currently pregnant, third trimester   5.  Preeclampsia with severe features   Plan: Ad[facility] to [facility] Routine orders Will clip cerclage Magnesium Sulfate infusion Induction of labor Betamethasone    Medication List    ASK your doctor about these medications        acetaminophen 500 MG tablet  Commonly known as:  TYLENOL  Take 1,000 mg by mouth every 6 (six) hours as needed for moderate pain.     prenatal multivitamin Tabs tablet  Take 1 tablet by mouth daily at 12 noon.       Report given to Delivery team  Wynelle Bourgeois CNM, MSN Certified Nurse-Midwife 03/24/2015 5:39 PM

## 2015-03-24 NOTE — MAU Note (Signed)
Pain started yesterday, stabbing pains in RUQ- unchanged but continues.  Pain in low back.  No movement since last night

## 2015-03-24 NOTE — Telephone Encounter (Signed)
Called pt and she stated that she is aware of her appts tomorrow for ultrasound followed by NST @ clinic. She had no questions.

## 2015-03-24 NOTE — H&P (Signed)
LABOR ADMISSION HISTORY AND PHYSICAL  Margaret Keller is a 20 y.o. female G39P0200 with IUP at [redacted]w[redacted]d by LMP presenting for preeclampsia with severe features. Patient is preterm. She has h/o preterm deliveries and had cerclage placed this pregnancy with 17P injections.  Patient presented to the MAU with headache, RUQ pain, and decreased FM. She reports decreased fetal movement, denies LOF, vaginal bleeding, vaginal itching/burning, urinary symptoms, dizziness, n/v, diarrhea, constipation or fever/chills. She denies visual changes.  Dating: By LMP--> Estimated Date of Delivery: 04/21/15  Sono:   , CWD, normal anatomy, cephalic presentation, 1327g, 16% EFW   Prenatal History/Complications: Cervical insuffiencey; cerclage suture placed 11/19/14, antepartum Proteinuric hypertension of pregnancy in third trimester, antepartum Late prenatal care affecting pregnancy, antepartum H/o preterm delivery [facility]  Past Medical History: Past Medical History  Diagnosis Date  . Preterm labor     Past Surgical History: Past Surgical History  Procedure Laterality Date  . Cervical cerclage N/A 11/19/2014    Procedure: CERCLAGE CERVICAL;  Surgeon: Tereso Newcomer, MD;  Location: [facility];  Service: Gynecology;  Laterality: N/A;    Obstetrical History: OB History    Gravida Para Term Preterm AB TAB SAB Ectopic Multiple Living   0 0      Social History: Social History   Social History  . Marital Status: Single    Spouse Name: N/A  . Number of Children: N/A  . Years of Education: N/A   Social History Main Topics  . Smoking status: Never Smoker   . Smokeless tobacco: Never Used  . Alcohol Use: No  . Drug Use: No  . Sexual Activity: Yes     Comment: last intercourse 03/19/15   Other Topics Concern  . None   Social History Narrative    Family History: Family History  Problem Relation Age of Onset  . Hypertension Mother   . Diabetes Father     Allergies: No Known  Allergies  Facility-administered medications prior to admission  Medication Dose Route Frequency Provider Last Rate Last Dose  . hydroxyprogesterone caproate (DELALUTIN) 250 mg/mL injection 250 mg  250 mg Intramuscular Weekly Ugonna A Anyanwu, MD   250 mg at 03/13/15 1014   Prescriptions prior to admission  Medication Sig Dispense Refill Last Dose  . acetaminophen (TYLENOL) 500 MG tablet Take 1,000 mg by mouth every 6 (six) hours as needed for moderate pain.   03/24/2015 at Unknown time  . Prenatal Vit-Fe Fumarate-FA (PRENATAL MULTIVITAMIN) TABS tablet Take 1 tablet by mouth daily at 12 noon.   Past Week at Unknown time     Review of Systems  Constitutional: Negative for fever, chills and fatigue.  HENT: Negative for sinus pressure and sore throat.  Respiratory: Negative for shortness of breath.  Cardiovascular: Positive for leg swelling.  Gastrointestinal: Positive for abdominal pain. Negative for nausea, vomiting, diarrhea, constipation and anorexia.  Genitourinary: Negative for dysuria, frequency, vaginal bleeding, vaginal discharge and pelvic pain.  Musculoskeletal: Positive for back pain. Negative for myalgias.  Neurological: Positive for headaches. Negative for dizziness, speech difficulty and weakness.  Other systems negative. Also per HPI.   BP 153/87 mmHg  Pulse 103  Temp(Src) 98.4 F (36.9 C) (Oral)  Resp 20  Ht  (1.651 m)  Wt 163 lb (73.936 kg)  BMI 27.12 kg/m2  LMP 07/15/2014 Constitutional: Well-developed, well-nourished female in no acute distress.  Cardiovascular: normal rate and rhythm  Respiratory: normal effort, clear to auscultation bilaterally  GI: Abd soft, non-tender, gravid appropriate for gestational age. No rebound or guarding.  MS: Extremities nontender, trace edema, normal ROM  Neurologic: Alert and oriented x 4. DTRs brisk  GU: Neg CVAT.  Presentation: cephalic Fetal monitoring: Baseline: 150 bpm, Variability: Good {> 6 bpm), Accelerations:  Reactive and Decelerations: Absent Uterine activity: Frequency: Every 5-8 minutes Dilation: 1 Effacement (%): 50 Station: -1 Exam by:: Dr. Alvester Morin    Prenatal labs: ABO, Rh: O/POS/-- (10/27 0856) Antibody: NEG (10/27 0856) Rubella: Immune RPR: NON REAC (01/12 1031)  HBsAg: NEGATIVE (10/27 0856)  HIV: NONREACTIVE (01/12 1031)  GBS: Positive (05/20 0000)  1 hr Glucola 96 Genetic screening quad neg Anatomy US Normal  Prenatal Transfer Tool  Maternal Diabetes: No Genetic Screening: Normal Maternal Ultrasounds/Referrals: Normal Fetal Ultrasounds or other Referrals:  None Maternal Substance Abuse:  No Significant Maternal Medications:  None Significant Maternal Lab Results: None  Results for orders placed or performed during the hospital encounter of 03/24/15 (from the past 24 hour(s))  CBC   Collection Time: 03/24/15  6:19 PM  Result Value Ref Range   WBC 12.5 (H) 4.0 - 10.5 K/uL   RBC 4.31 3.87 - 5.11 MIL/uL   Hemoglobin 12.3 12.0 - 15.0 g/dL   HCT 40.9 (L) 81.1 - 91.4 %   MCV 83.1 78.0 - 100.0 fL   MCH 28.5 26.0 - 34.0 pg   MCHC 34.4 30.0 - 36.0 g/dL   RDW 78.2 95.6 - 21.3 %   Platelets 133 (L) 150 - 400 K/uL  Comprehensive metabolic panel   Collection Time: 03/24/15  6:19 PM  Result Value Ref Range   Sodium 133 (L) 135 - 145 mmol/L   Potassium 3.7 3.5 - 5.1 mmol/L   Chloride 110 101 - 111 mmol/L   CO2 18 (L) 22 - 32 mmol/L   Glucose, Bld 81 65 - 99 mg/dL   BUN <5 (L) 6 - 20 mg/dL   Creatinine, Ser 0.86 0.44 - 1.00 mg/dL   Calcium 8.3 (L) 8.9 - 10.3 mg/dL   Total Protein 7.1 6.5 - 8.1 g/dL   Albumin 3.1 (L) 3.5 - 5.0 g/dL   AST 23 15 - 41 U/L   ALT 16 14 - 54 U/L   Alkaline Phosphatase 139 (H) 38 - 126 U/L   Total Bilirubin 0.5 0.3 - 1.2 mg/dL   GFR calc non Af Amer >60 >60 mL/min   GFR calc Af Amer >60 >60 mL/min   Anion gap 5 5 - 15  Protein / creatinine ratio, urine   Collection Time: 03/24/15  6:35 PM  Result Value Ref Range   Creatinine, Urine 51.00  mg/dL   Total Protein, Urine 17 mg/dL   Protein Creatinine Ratio 0.33 (H) 0.00 - 0.15 mg/mg[Cre]    Patient Active Problem List   Diagnosis Date Noted  . Severe preeclampsia 03/24/2015  . 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 Cervical cerclage suture present (placed 11/19/14), antepartum 11/28/2014  . Late prenatal care affecting pregnancy, antepartum 11/11/2014  . History of preterm delivery, currently pregnant 11/07/2014  . Supervision of high-risk pregnancy 11/07/2014  . Cervical insufficiency during pregnancy, antepartum 11/07/2014    Assessment: Margaret Keller is a 20 y.o. G3P0200 at [redacted]w[redacted]d here for IOL secondary to sever preeclampsia.   Admit to [employer] [facility] #Labor: Patient with frequent contractions. Will see if her cervix changes naturally. Reevaluate. Hold off on FB at this time. Will try and give one dose of Cytotec to  soften cervix. #Pain: Plan for epidural  #FWB: Category 1 #ID: GBS unknown. Will empirically give penicillin #MOF: Breast #MOC: Nexplanon  Caryl AdaJazma Phelps, DO 03/24/2015, 9:22 PM PGY-2,  Family Medicine  I have seen this patient and agree with the above resident's note.  LEFTWICH-KIRBY, Sumeya Yontz Certified Nurse-Midwife

## 2015-03-25 ENCOUNTER — Other Ambulatory Visit: Payer: Medicaid Other

## 2015-03-25 ENCOUNTER — Inpatient Hospital Stay (HOSPITAL_COMMUNITY): Payer: Medicaid Other | Admitting: Anesthesiology

## 2015-03-25 ENCOUNTER — Inpatient Hospital Stay (HOSPITAL_COMMUNITY): Payer: Medicaid Other

## 2015-03-25 ENCOUNTER — Ambulatory Visit (HOSPITAL_COMMUNITY): Payer: Medicaid Other

## 2015-03-25 LAB — MAGNESIUM: MAGNESIUM: 6.3 mg/dL — AB (ref 1.7–2.4)

## 2015-03-25 LAB — CBC
HEMATOCRIT: 35.1 % — AB (ref 36.0–46.0)
HEMOGLOBIN: 12.3 g/dL (ref 12.0–15.0)
MCH: 29.2 pg (ref 26.0–34.0)
MCHC: 35 g/dL (ref 30.0–36.0)
MCV: 83.4 fL (ref 78.0–100.0)
Platelets: 126 10*3/uL — ABNORMAL LOW (ref 150–400)
RBC: 4.21 MIL/uL (ref 3.87–5.11)
RDW: 13.7 % (ref 11.5–15.5)
WBC: 20.6 10*3/uL — ABNORMAL HIGH (ref 4.0–10.5)

## 2015-03-25 LAB — HIV ANTIBODY (ROUTINE TESTING W REFLEX): HIV Screen 4th Generation wRfx: NONREACTIVE

## 2015-03-25 LAB — RPR: RPR Ser Ql: NONREACTIVE

## 2015-03-25 MED ORDER — EPHEDRINE 5 MG/ML INJ
10.0000 mg | INTRAVENOUS | Status: DC | PRN
Start: 2015-03-25 — End: 2015-03-26

## 2015-03-25 MED ORDER — DIPHENHYDRAMINE HCL 50 MG/ML IJ SOLN: 12.5000 mg | INTRAMUSCULAR | Status: DC | PRN

## 2015-03-25 MED ORDER — PHENYLEPHRINE 40 MCG/ML (10ML) SYRINGE FOR IV PUSH (FOR BLOOD PRESSURE SUPPORT): 80.0000 ug | PREFILLED_SYRINGE | INTRAVENOUS | Status: DC | PRN

## 2015-03-25 MED ORDER — LACTATED RINGERS IV SOLN: 500.0000 mL | Freq: Once | INTRAVENOUS | Status: DC

## 2015-03-25 MED ORDER — GI COCKTAIL ~~LOC~~
30.0000 mL | Freq: Once | ORAL | Status: AC
  Administered 2015-03-25: 30 mL via ORAL
  Filled 2015-03-25: qty 30

## 2015-03-25 MED ORDER — FENTANYL 2.5 MCG/ML BUPIVACAINE 1/10 % EPIDURAL INFUSION (WH - ANES)
14.0000 mL/h | INTRAMUSCULAR | Status: DC | PRN
  Administered 2015-03-25 – 2015-03-26 (×3): 14 mL/h via EPIDURAL
  Filled 2015-03-25 (×3): qty 125

## 2015-03-25 MED ORDER — EPHEDRINE 5 MG/ML INJ: 10.0000 mg | INTRAVENOUS | Status: DC | PRN

## 2015-03-25 MED ORDER — LIDOCAINE HCL (PF) 1 % IJ SOLN
INTRAMUSCULAR | Status: DC | PRN
  Administered 2015-03-25 (×2): 5 mL

## 2015-03-25 MED ORDER — MISOPROSTOL 25 MCG QUARTER TABLET
25.0000 ug | ORAL_TABLET | ORAL | Status: DC | PRN
  Administered 2015-03-25 (×2): 25 ug via VAGINAL
  Filled 2015-03-25 (×2): qty 0.25

## 2015-03-25 MED ORDER — OXYTOCIN 10 UNIT/ML IJ SOLN
1.0000 m[IU]/min | INTRAVENOUS | Status: DC
  Administered 2015-03-25: 2 m[IU]/min via INTRAVENOUS
  Filled 2015-03-25: qty 10

## 2015-03-25 MED ORDER — PHENYLEPHRINE 40 MCG/ML (10ML) SYRINGE FOR IV PUSH (FOR BLOOD PRESSURE SUPPORT)
80.0000 ug | PREFILLED_SYRINGE | INTRAVENOUS | Status: DC | PRN
  Filled 2015-03-25: qty 20

## 2015-03-25 NOTE — Progress Notes (Signed)
Pt c/o of chest tightness and SOB. Dr. Ashok PallWouk notified will come assess pt.

## 2015-03-25 NOTE — Anesthesia Procedure Notes (Signed)
Epidural Patient location during procedure: OB  Staffing Anesthesiologist: Blayn Whetsell Performed by: anesthesiologist   Preanesthetic Checklist Completed: patient identified, site marked, surgical consent, pre-op evaluation, timeout performed, IV checked, risks and benefits discussed and monitors and equipment checked  Epidural Patient position: sitting Prep: DuraPrep Patient monitoring: heart rate, continuous pulse ox and blood pressure Approach: right paramedian Location: L3-L4 Injection technique: LOR saline  Needle:  Needle type: Tuohy  Needle gauge: 17 G Needle length: 9 cm and 9 Needle insertion depth: 5 cm Catheter type: closed end flexible Catheter size: 20 Guage Catheter at skin depth: 9 cm Test dose: negative  Assessment Events: blood not aspirated, injection not painful, no injection resistance, negative IV test and no paresthesia  Additional Notes Patient identified. Risks/Benefits/Options discussed with patient including but not limited to bleeding, infection, nerve damage, paralysis, failed block, incomplete pain control, headache, blood pressure changes, nausea, vomiting, reactions to medication both or allergic, itching and postpartum back pain. Confirmed with bedside nurse the patient's most recent platelet count. Confirmed with patient that they are not currently taking any anticoagulation, have any bleeding history or any family history of bleeding disorders. Patient expressed understanding and wished to proceed. All questions were answered. Sterile technique was used throughout the entire procedure. Please see nursing notes for vital signs. Test dose was given through epidural needle and negative prior to continuing to dose epidural or start infusion. Warning signs of high block given to the patient including shortness of breath, tingling/numbness in hands, complete motor block, or any concerning symptoms with instructions to call for help. Patient was given  instructions on fall risk and not to get out of bed. All questions and concerns addressed with instructions to call with any issues.   

## 2015-03-25 NOTE — Progress Notes (Signed)
Lytle MichaelsShadae T Mccrackin is a 20 y.o. G3P0200 at 589w1d by LMP admitted for induction of labor due to Pre-eclamptic toxemia of pregnancy..  Subjective: Patient resting comfortably in room. Not feeling pain or contractions at this time.   Objective: BP 135/77 mmHg  Pulse 117  Temp(Src) 97.6 F (36.4 C) (Oral)  Resp 18  Ht 5\' 5"  (1.651 m)  Wt 73.936 kg (163 lb)  BMI 27.12 kg/m2  SpO2 99%  LMP 07/15/2014 I/O last 3 completed shifts: In: 6259.5 [P.O.:2960; I.V.:2649.5; IV Piggyback:650] Out: 4325 [Urine:4325]    FHT:  FHR: 130 bpm, variability: moderate,  accelerations:  Present,  decelerations:  Absent UC:   regular, every 2 minutes SVE:   Dilation: 6 Effacement (%): 60, 70 Station: -1 Exam by:: Dr Fabio NeighborsGambino/Poore RNC  Labs: Lab Results  Component Value Date   WBC 20.6* 03/25/2015   HGB 12.3 03/25/2015   HCT 35.1* 03/25/2015   MCV 83.4 03/25/2015   PLT 126* 03/25/2015    Assessment / Plan: Induction of labor due to preeclampsia,  progressing well on pitocin after pit break earlier in evening.   Labor: Progressing on Pitocin, will continue to monitor for tachysystole  Preeclampsia:  on magnesium sulfate and no signs or symptoms of toxicity Fetal Wellbeing:  Category I Pain Control:  Epidural I/D:  n/a Anticipated MOD:  NSVD  Beaulah DinningChristina M Gambino 03/25/2015, 11:11 PM

## 2015-03-25 NOTE — Consults (Signed)
  Anesthesia Pain Consult Note  Patient: Margaret Keller, 20 y.o., female  Consult Requested by: Levie HeritageJacob J Stinson, DO  Reason for Consult: CRNA pain rounds  Level of Consciousness: alert  Pain: current pain 0 after IV pain meds, 7 before pain meds.  Pain goal 8.  Desires epidural     Cephus ShellingBURGER,Margaret Keller 03/25/2015

## 2015-03-25 NOTE — Progress Notes (Signed)
LABOR PROGRESS NOTE  Margaret Keller is a 20 y.o. G3P0200 at 3943w1d  admitted for iol for severe preE.  Subjective: Mild substernal constant sharp chest pain for one hour. First instance of this pregnancy. Says has had some gerd in this pregnancy. Does not radiate. Not short of breath. Not worsened with deep breathing.   Objective: BP 139/81 mmHg  Pulse 113  Temp(Src) 97.9 F (36.6 C) (Oral)  Resp 18  Ht 5\' 5"  (1.651 m)  Wt 163 lb (73.936 kg)  BMI 27.12 kg/m2  SpO2 98%  LMP 07/15/2014 or  Filed Vitals:   03/25/15 1806 03/25/15 1811 03/25/15 1816 03/25/15 1821  BP:      Pulse: 57 103 115 113  Temp:      TempSrc:      Resp:      Height:      Weight:      SpO2: 78% 98% 98% 98%    RRR, no rubs/murmurs/gallops CTAB, normal rate Calves both measure 14 inches, trace b/l edema, no palpable cord or calf tenderness  Dilation: 4 Effacement (%): 60 Station: -2 Presentation: Vertex Exam by:: J.Cox, RN  Labs: Lab Results  Component Value Date   WBC 20.6* 03/25/2015   HGB 12.3 03/25/2015   HCT 35.1* 03/25/2015   MCV 83.4 03/25/2015   PLT 126* 03/25/2015    Patient Active Problem List   Diagnosis Date Noted  . Severe preeclampsia 03/24/2015  . 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 Cervical cerclage suture present (placed 11/19/14), antepartum 11/28/2014  . Late prenatal care affecting pregnancy, antepartum 11/11/2014  . History of preterm delivery, currently pregnant 11/07/2014  . Supervision of high-risk pregnancy 11/07/2014  . Cervical insufficiency during pregnancy, antepartum 11/07/2014    Assessment / Plan: 20 y.o. G3P0200 at 5143w1d here for iol 2/2 preE now with chest pain  Labor: tachysystole w/ 8 of pitocin, will halve Fetal Wellbeing:  Cat 1 Pain Control:  epidural Anticipated MOD:  Vag PreE: no severe-range BPs recently, receiving magnesium Chest pain: no history cardiac disease. Normal vitals - no tachypnea or  hypoxia or pleuritic pain or dyspnea or signs dvt, so very low suspicion for PE. Lungs clear. Cardiac exam normal. Given severe preE will check CXR to eval for signs pulmonary edema. Will also check ECG and mg level, though would not expect cardiac conduction abnormalities given no other signs mg toxicity.  Silvano BilisNoah B Maricia Scotti, MD 03/25/2015, 6:24 PM

## 2015-03-25 NOTE — Progress Notes (Signed)
Labor Progress Note  Margaret Keller is a 20 y.o. G3P0200 at 5073w1d admitted for induction of labor due to Pre-eclamptic toxemia of pregnancy with severe features.  S: Patient resting well. No concerns.  O:  BP 137/73 mmHg  Pulse 101  Temp(Src) 97.9 F (36.6 C) (Oral)  Resp 18  Ht 5\' 5"  (1.651 m)  Wt 163 lb (73.936 kg)  BMI 27.12 kg/m2  LMP 07/15/2014  Total I/O In: 1822.1 [P.O.:720; I.V.:752.1; IV Piggyback:350] Out: 1050 [Urine:1050] FHT:  FHR: \135 bpm, variability: moderate,  accelerations:  Present,  decelerations:  Absent UC:   regular, every 5-8 minutes SVE:   Dilation: 1 Effacement (%): 50 Station: -2 Exam by:: Kasey Ewings Membranes intact  Labs: Lab Results  Component Value Date   WBC 15.5* 03/24/2015   HGB 12.5 03/24/2015   HCT 36.2 03/24/2015   MCV 82.5 03/24/2015   PLT 135* 03/24/2015    Assessment / Plan: 20 y.o. G3P0200 873w1d not in labor. Induction of labor due to severe preeclampsia  Labor: No progress since admission. Placed FB. Will give cytotec. Fetal Wellbeing:  Category I Pain Control:  Labor support without medications but plan for epidural Anticipated MOD:  NSVD  Expectant management   Margaret AdaJazma Emmalise Huard, DO 03/25/2015, 3:27 AM PGY-2, [facility]

## 2015-03-25 NOTE — Anesthesia Preprocedure Evaluation (Signed)
Anesthesia Evaluation  Patient identified by MRN, date of birth, ID band Patient awake    Reviewed: Allergy & Precautions, H&P , NPO status , Patient's Chart, lab work & pertinent test results  History of Anesthesia Complications Negative for: history of anesthetic complications  Airway Mallampati: II  TM Distance: >3 FB Neck ROM: full    Dental no notable dental hx. (+) Teeth Intact   Pulmonary neg pulmonary ROS,    Pulmonary exam normal breath sounds clear to auscultation       Cardiovascular negative cardio ROS Normal cardiovascular exam Rhythm:regular Rate:Normal     Neuro/Psych negative neurological ROS  negative psych ROS   GI/Hepatic negative GI ROS, Neg liver ROS,   Endo/Other  negative endocrine ROS  Renal/GU negative Renal ROS  negative genitourinary   Musculoskeletal   Abdominal   Peds  Hematology negative hematology ROS (+)   Anesthesia Other Findings   Reproductive/Obstetrics (+) Pregnancy Pre-eclampsia. On magnesium                             Anesthesia Physical Anesthesia Plan  ASA: II  Anesthesia Plan: Epidural   Post-op Pain Management:    Induction:   Airway Management Planned:   Additional Equipment:   Intra-op Plan:   Post-operative Plan:   Informed Consent: I have reviewed the patients History and Physical, chart, labs and discussed the procedure including the risks, benefits and alternatives for the proposed anesthesia with the patient or authorized representative who has indicated his/her understanding and acceptance.     Plan Discussed with:   Anesthesia Plan Comments:         Anesthesia Quick Evaluation

## 2015-03-25 NOTE — Progress Notes (Signed)
Pt states she is now feeling better. Chest pain gone.

## 2015-03-25 NOTE — Progress Notes (Signed)
Labor Progress Note  Margaret Keller is a 20 y.o. G3P0200 at 6843w1d admitted for induction of labor due to severe preeclampsia.  S: Patient resting well. No concerns. Desires cervical check  O: BP 134/75 mmHg  Pulse 95  Temp(Src) 97.9 F (36.6 C) (Oral)  Resp 16  Ht 5\' 5"  (1.651 m)  Wt 163 lb (73.936 kg)  BMI 27.12 kg/m2  LMP 07/15/2014  Total I/O In: 2330 [P.O.:1480; I.V.:750; IV Piggyback:100] Out: 1750 [Urine:1750] FHT:  FHR: 145 bpm, variability: moderate,  accelerations:  Present,  decelerations:  Absent UC:   regular, every 1-4 minutes SVE:   Dilation: 4 Effacement (%): 50 Station: -2 Exam by:: dr Macon Largeanyanwu Membranes intact Foley bulb noted to be in vagina and removed  Labs: Lab Results  Component Value Date   WBC 15.5* 03/24/2015   HGB 12.5 03/24/2015   HCT 36.2 03/24/2015   MCV 82.5 03/24/2015   PLT 135* 03/24/2015    Assessment / Plan: 20 y.o. G3P0200 6843w1d here for induction of labor due to severe preeclampsia  Labor: Will start pitocin per protocol. Preeclampsia:  Stable BP.  Continue magnesium sulfate for eclampsia prophylaxis. Fetal Wellbeing:  Category I Pain Control:  Labor support without medications but now desires epidural; this was ordered Anticipated MOD:  Hopeful for NSVD   Tereso NewcomerUgonna A Delsa Walder, MD 03/25/2015, 2:06 PM

## 2015-03-25 NOTE — Progress Notes (Signed)
Labor Progress Note  Margaret Keller is a 20 y.o. G3P0200 at 2552w1d admitted for induction of labor due to Pre-eclamptic toxemia of pregnancy with severe features.  S: Patient resting well. No concerns. Feels the contractions in her back.   O:  BP 129/76 mmHg  Pulse 105  Temp(Src) 97.9 F (36.6 C) (Oral)  Resp 18  Ht 5\' 5"  (1.651 m)  Wt 163 lb (73.936 kg)  BMI 27.12 kg/m2  LMP 07/15/2014  Total I/O In: 860 [P.O.:360; I.V.:250; IV Piggyback:250] Out: 475 [Urine:475] FHT:  FHR: 145 bpm, variability: moderate,  accelerations:  Present,  decelerations:  Absent UC:   regular, every 4-8 minutes SVE:   Dilation: 1 Effacement (%): 50 Station: -3 Exam by:: Bridney Guadarrama Membranes intact  Labs: Lab Results  Component Value Date   WBC 15.5* 03/24/2015   HGB 12.5 03/24/2015   HCT 36.2 03/24/2015   MCV 82.5 03/24/2015   PLT 135* 03/24/2015    Assessment / Plan: 20 y.o. G3P0200 6752w1d not in labor. Induction of labor due to severe preeclampsia  Labor: No progress since admission. Placed FB. Hold on cytotec due to frequent contraction pattern Fetal Wellbeing:  Category I Pain Control:  Labor support without medications but plan for epidural Anticipated MOD:  NSVD  Expectant management   Margaret AdaJazma Hasset Chaviano, DO 03/25/2015, 12:06 AM PGY-2, [facility]

## 2015-03-26 ENCOUNTER — Encounter (HOSPITAL_COMMUNITY): Payer: Self-pay

## 2015-03-26 DIAGNOSIS — Z3A36 36 weeks gestation of pregnancy: Secondary | ICD-10-CM

## 2015-03-26 DIAGNOSIS — O1414 Severe pre-eclampsia complicating childbirth: Secondary | ICD-10-CM

## 2015-03-26 LAB — COMPREHENSIVE METABOLIC PANEL
ALBUMIN: 2.8 g/dL — AB (ref 3.5–5.0)
ALT: 14 U/L (ref 14–54)
ANION GAP: 8 (ref 5–15)
AST: 24 U/L (ref 15–41)
Alkaline Phosphatase: 126 U/L (ref 38–126)
BILIRUBIN TOTAL: 0.4 mg/dL (ref 0.3–1.2)
BUN: <5 mg/dL — ABNORMAL LOW (ref 6–20)
CO2: 23 mmol/L (ref 22–32)
Calcium: 7.2 mg/dL — ABNORMAL LOW (ref 8.9–10.3)
Chloride: 107 mmol/L (ref 101–111)
Creatinine, Ser: 0.67 mg/dL (ref 0.44–1.00)
GFR calc Af Amer: >60 mL/min (ref >60)
GFR calc non Af Amer: >60 mL/min (ref >60)
GLUCOSE: 138 mg/dL — AB (ref 65–99)
POTASSIUM: 4.4 mmol/L (ref 3.5–5.1)
SODIUM: 138 mmol/L (ref 135–145)
TOTAL PROTEIN: 6.6 g/dL (ref 6.5–8.1)

## 2015-03-26 LAB — CBC
HEMATOCRIT: 32.7 % — AB (ref 36.0–46.0)
HEMATOCRIT: 32 % — AB (ref 36.0–46.0)
Hemoglobin: 11.1 g/dL — ABNORMAL LOW (ref 12.0–15.0)
Hemoglobin: 10.8 g/dL — ABNORMAL LOW (ref 12.0–15.0)
MCH: 28.5 pg (ref 26.0–34.0)
MCH: 28.6 pg (ref 26.0–34.0)
MCHC: 33.9 g/dL (ref 30.0–36.0)
MCHC: 33.8 g/dL (ref 30.0–36.0)
MCV: 83.8 fL (ref 78.0–100.0)
MCV: 84.7 fL (ref 78.0–100.0)
PLATELETS: 135 10*3/uL — AB (ref 150–400)
Platelets: 130 10*3/uL — ABNORMAL LOW (ref 150–400)
RBC: 3.9 MIL/uL (ref 3.87–5.11)
RBC: 3.78 MIL/uL — ABNORMAL LOW (ref 3.87–5.11)
RDW: 13.8 % (ref 11.5–15.5)
RDW: 13.8 % (ref 11.5–15.5)
WBC: 20 10*3/uL — ABNORMAL HIGH (ref 4.0–10.5)
WBC: 28 10*3/uL — AB (ref 4.0–10.5)

## 2015-03-26 MED ORDER — LACTATED RINGERS IV SOLN
INTRAVENOUS | Status: DC
  Administered 2015-03-27 (×2): via INTRAVENOUS

## 2015-03-26 MED ORDER — ONDANSETRON HCL 4 MG/2ML IJ SOLN: 4.0000 mg | INTRAMUSCULAR | Status: DC | PRN

## 2015-03-26 MED ORDER — ACETAMINOPHEN 325 MG PO TABS
650.0000 mg | ORAL_TABLET | ORAL | Status: DC | PRN
  Administered 2015-03-26: 650 mg via ORAL
  Filled 2015-03-26: qty 2

## 2015-03-26 MED ORDER — DIBUCAINE 1 % RE OINT
1.0000 "application " | TOPICAL_OINTMENT | RECTAL | Status: DC | PRN
  Filled 2015-03-26: qty 28

## 2015-03-26 MED ORDER — TETANUS-DIPHTH-ACELL PERTUSSIS 5-2.5-18.5 LF-MCG/0.5 IM SUSP
0.5000 mL | Freq: Once | INTRAMUSCULAR | Status: DC
  Filled 2015-03-26: qty 0.5

## 2015-03-26 MED ORDER — HYDRALAZINE HCL 20 MG/ML IJ SOLN: 10.0000 mg | Freq: Once | INTRAMUSCULAR | Status: DC | PRN

## 2015-03-26 MED ORDER — PRENATAL MULTIVITAMIN CH
1.0000 | ORAL_TABLET | Freq: Every day | ORAL | Status: DC
  Administered 2015-03-27 – 2015-03-28 (×2): 1 via ORAL
  Filled 2015-03-26 (×2): qty 1

## 2015-03-26 MED ORDER — MISOPROSTOL 200 MCG PO TABS
ORAL_TABLET | ORAL | Status: AC
  Filled 2015-03-26: qty 2

## 2015-03-26 MED ORDER — BENZOCAINE-MENTHOL 20-0.5 % EX AERO
1.0000 "application " | INHALATION_SPRAY | CUTANEOUS | Status: DC | PRN
  Administered 2015-03-27: 1 via TOPICAL
  Filled 2015-03-26: qty 56

## 2015-03-26 MED ORDER — SENNOSIDES-DOCUSATE SODIUM 8.6-50 MG PO TABS
2.0000 | ORAL_TABLET | ORAL | Status: DC
  Administered 2015-03-26 – 2015-03-27 (×2): 2 via ORAL
  Filled 2015-03-26 (×2): qty 2

## 2015-03-26 MED ORDER — ACETAMINOPHEN 325 MG PO TABS
650.0000 mg | ORAL_TABLET | Freq: Once | ORAL | Status: DC
Start: 2015-03-26 — End: 2015-03-28

## 2015-03-26 MED ORDER — IBUPROFEN 600 MG PO TABS
600.0000 mg | ORAL_TABLET | Freq: Four times a day (QID) | ORAL | Status: DC
  Administered 2015-03-26 – 2015-03-28 (×8): 600 mg via ORAL
  Filled 2015-03-26 (×8): qty 1

## 2015-03-26 MED ORDER — LABETALOL HCL 5 MG/ML IV SOLN: 20.0000 mg | INTRAVENOUS | Status: DC | PRN

## 2015-03-26 MED ORDER — SIMETHICONE 80 MG PO CHEW: 80.0000 mg | CHEWABLE_TABLET | ORAL | Status: DC | PRN

## 2015-03-26 MED ORDER — ZOLPIDEM TARTRATE 5 MG PO TABS: 5.0000 mg | ORAL_TABLET | Freq: Every evening | ORAL | Status: DC | PRN

## 2015-03-26 MED ORDER — MISOPROSTOL 200 MCG PO TABS
400.0000 ug | ORAL_TABLET | Freq: Once | ORAL | Status: AC
  Administered 2015-03-26: 400 ug via ORAL

## 2015-03-26 MED ORDER — ONDANSETRON HCL 4 MG PO TABS: 4.0000 mg | ORAL_TABLET | ORAL | Status: DC | PRN

## 2015-03-26 MED ORDER — LACTATED RINGERS IV SOLN
2.0000 g/h | INTRAVENOUS | Status: DC
  Administered 2015-03-27: 2 g/h via INTRAVENOUS
  Filled 2015-03-26 (×2): qty 80

## 2015-03-26 MED ORDER — WITCH HAZEL-GLYCERIN EX PADS: 1.0000 "application " | MEDICATED_PAD | CUTANEOUS | Status: DC | PRN

## 2015-03-26 MED ORDER — LANOLIN HYDROUS EX OINT: TOPICAL_OINTMENT | CUTANEOUS | Status: DC | PRN

## 2015-03-26 MED ORDER — DIPHENHYDRAMINE HCL 25 MG PO CAPS: 25.0000 mg | ORAL_CAPSULE | Freq: Four times a day (QID) | ORAL | Status: DC | PRN

## 2015-03-26 NOTE — Progress Notes (Signed)
LABOR PROGRESS NOTE  Margaret Keller is a 20 y.o. G3P0200 at 6166w2d  admitted for iol for preE w/ severe features  Subjective: No pain, no ha, epigastric/ruq pain, sob, or vision change  Objective: BP 122/64 mmHg  Pulse 99  Temp(Src) 98.4 F (36.9 C) (Oral)  Resp 16  Ht 5\' 5"  (1.651 m)  Wt 163 lb (73.936 kg)  BMI 27.12 kg/m2  SpO2 99%  LMP 07/15/2014 or  Filed Vitals:   03/26/15 0730 03/26/15 0800 03/26/15 0830 03/26/15 0900  BP: 108/60 112/59 123/74 122/64  Pulse: 90 95 92 99  Temp: 98.4 F (36.9 C)     TempSrc: Oral     Resp: 16 16 16 16   Height:      Weight:      SpO2:        145/mod/-a/-d, accel w/ scalp stim  4.5/70/-2  Labs: Lab Results  Component Value Date   WBC 20.0* 03/26/2015   HGB 11.1* 03/26/2015   HCT 32.7* 03/26/2015   MCV 83.8 03/26/2015   PLT 130* 03/26/2015    Patient Active Problem List   Diagnosis Date Noted  . Severe preeclampsia 03/24/2015  . 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 Cervical cerclage suture present (placed 11/19/14), antepartum 11/28/2014  . Late prenatal care affecting pregnancy, antepartum 11/11/2014  . History of preterm delivery, currently pregnant 11/07/2014  . Supervision of high-risk pregnancy 11/07/2014  . Cervical insufficiency during pregnancy, antepartum 11/07/2014    Assessment / Plan: 20 y.o. G3P0200 at 1466w2d here for iol 2/2 pre/e w/ severe features  Labor: s/p ripening and pitocin. Pitocin yesterday and overnight not effecting significant cervical change. Just now s/p arom with iupc, will titrate pitocin to 200 or greater mvus Fetal Wellbeing:  Cat 1 Pain Control:  S/p epidural Anticipated MOD:  Vag PreE: on mg, bp currently wnl, repeat hellp panel pending this am (plts 126 last check)  Silvano BilisNoah B Akiko Schexnider, MD 03/26/2015, 9:33 AM

## 2015-03-27 ENCOUNTER — Encounter: Payer: Medicaid Other | Admitting: Family Medicine

## 2015-03-27 MED ORDER — OXYCODONE-ACETAMINOPHEN 5-325 MG PO TABS
1.0000 | ORAL_TABLET | ORAL | Status: DC | PRN
  Administered 2015-03-27 – 2015-03-28 (×4): 1 via ORAL
  Filled 2015-03-27 (×4): qty 1

## 2015-03-27 NOTE — Anesthesia Postprocedure Evaluation (Signed)
Anesthesia Post Note  Patient: Margaret Keller  Procedure(s) Performed: * No procedures listed *  Patient location during evaluation: [facility] Anesthesia Type: Epidural Level of consciousness: awake and alert and oriented Pain management: pain level controlled Vital Signs Assessment: post-procedure vital signs reviewed and stable Respiratory status: spontaneous breathing Cardiovascular status: blood pressure returned to baseline Postop Assessment: no headache, no backache, patient able to bend at knees, no signs of nausea or vomiting and adequate PO intake Anesthetic complications: no    Last Vitals:  Filed Vitals:   03/27/15 0800 03/27/15 0900  BP:    Pulse:    Temp:    Resp: 17 16    Last Pain:  Filed Vitals:   03/27/15 0917  PainSc: 3                  Letetia Romanello

## 2015-03-27 NOTE — Progress Notes (Signed)
Post Partum Day 1 Subjective:  Margaret Keller is a 20 y.o. [id number] 3657w2d s/p NSVD, induced for Preeclampsia with Severe Features.  No acute events overnight.  Pt denies problems with ambulating, voiding or po intake.  She denies nausea or vomiting.  Pain is well controlled.  She has had flatus.  Lochia Minimal.  Plan for birth control is IUD.  Method of Feeding: Breast  Objective: Blood pressure 116/66, pulse 72, temperature 97.6 F (36.4 C), temperature source Axillary, resp. rate 16, height 5\' 5"  (1.651 m), weight 163 lb (73.936 kg), last menstrual period 07/15/2014, SpO2 97 %, unknown if currently breastfeeding.  Physical Exam:  General: alert, cooperative and no distress Lochia:normal flow Chest: normal WOB Heart: Regular rate Abdomen: +BS, soft, mild TTP (appropriate) Uterine Fundus: firm, appropriate DVT Evaluation: No evidence of DVT seen on physical exam. Extremities: No edema   Recent Labs  03/26/15 0857 03/26/15 1549  HGB 11.1* 10.8*  HCT 32.7* 32.0*    Assessment/Plan:  ASSESSMENT: Margaret Keller is a 20 y.o. [id number] 4957w2d s/p NSVD after induction for PreX  Plan for discharge tomorrow, Breastfeeding and Contraception IUD Continue routine PP care Breastfeeding support PRN  LOS: 3 days   Margaret Keller 03/27/2015, 12:26 PM

## 2015-03-28 MED ORDER — SENNOSIDES-DOCUSATE SODIUM 8.6-50 MG PO TABS: 1.0000 | ORAL_TABLET | Freq: Every evening | ORAL | Status: DC | PRN

## 2015-03-28 MED ORDER — IBUPROFEN 600 MG PO TABS: 600.0000 mg | ORAL_TABLET | Freq: Four times a day (QID) | ORAL | Status: DC

## 2015-03-28 MED ORDER — ACETAMINOPHEN 325 MG PO TABS: 650.0000 mg | ORAL_TABLET | Freq: Once | ORAL | Status: DC

## 2015-03-28 NOTE — Progress Notes (Signed)
Post Partum Day 2 Subjective: no complaints, up ad lib, voiding, tolerating PO and + flatus. Denies HA, blurry vision, fever, or chills, SOB, CP, leg pain or swelling.   Objective: Blood pressure 127/72, pulse 68, temperature 98.4 F (36.9 C), temperature source Oral, resp. rate 15, height 5\' 5"  (1.651 m), weight 71.668 kg (158 lb), last menstrual period 07/15/2014, SpO2 99 %, unknown if currently breastfeeding.  Physical Exam:  General: alert, cooperative, appears stated age and no distress Lochia: appropriate Uterine Fundus: firm Incision: N/A DVT Evaluation: No evidence of DVT seen on physical exam. Negative Homan's sign. No cords or calf tenderness. No significant calf/ankle edema.   Recent Labs  03/26/15 0857 03/26/15 1549  HGB 11.1* 10.8*  HCT 32.7* 32.0*    Assessment/Plan: Pt states she would like to discharge today. Will discuss with treatment team. Continue current care.    LOS: 4 days   [facility] HospitalMorgan Holland-Payne 03/28/2015, 7:53 AM

## 2015-03-28 NOTE — Discharge Instructions (Signed)
°Preeclampsia and Eclampsia °Preeclampsia is a serious condition that develops only during pregnancy. It is also called toxemia of pregnancy. This condition causes high blood pressure along with other symptoms, such as swelling and headaches. These may develop as the condition gets worse. Preeclampsia may occur 20 weeks or later into your pregnancy.  °Diagnosing and treating preeclampsia early is very important. If not treated early, it can cause serious problems for you and your baby. One problem it can lead to is eclampsia, which is a condition that causes muscle jerking or shaking (convulsions) in the mother. Delivering your baby is the best treatment for preeclampsia or eclampsia.  °RISK FACTORS °The cause of preeclampsia is not known. You may be more likely to develop preeclampsia if you have certain risk factors. These include:  °· Being pregnant for the first time. °· Having preeclampsia in a past pregnancy. °· Having a family history of preeclampsia. °· Having high blood pressure. °· Being pregnant with twins or triplets. °· Being 35 or older. °· Being African American. °· Having kidney disease or diabetes. °· Having medical conditions such as lupus or blood diseases. °· Being very overweight (obese). °SIGNS AND SYMPTOMS  °The earliest signs of preeclampsia are: °· High blood pressure. °· Increased protein in your urine. Your health care provider will check for this at every prenatal visit. °Other symptoms that can develop include:  °· Severe headaches. °· Sudden weight gain. °· Swelling of your hands, face, legs, and feet. °· Feeling sick to your stomach (nauseous) and throwing up (vomiting). °· Vision problems (blurred or double vision). °· Numbness in your face, arms, legs, and feet. °· Dizziness. °· Slurred speech. °· Sensitivity to bright lights. °· Abdominal pain. °DIAGNOSIS  °There are no screening tests for preeclampsia. Your health care provider will ask you about symptoms and check for signs of  preeclampsia during your prenatal visits. You may also have tests, including: °· Urine testing. °· Blood testing. °· Checking your baby's heart rate. °· Checking the health of your baby and your placenta using images created with sound waves (ultrasound). °TREATMENT  °You can work out the best treatment approach together with your health care provider. It is very important to keep all prenatal appointments. If you have an increased risk of preeclampsia, you may need more frequent prenatal exams. °· Your health care provider may prescribe bed rest. °· You may have to eat as little salt as possible. °· You may need to take medicine to lower your blood pressure if the condition does not respond to more conservative measures. °· You may need to stay in the hospital if your condition is severe. There, treatment will focus on controlling your blood pressure and fluid retention. You may also need to take medicine to prevent seizures. °· If the condition gets worse, your baby may need to be delivered early to protect you and the baby. You may have your labor started with medicine (be induced), or you may have a cesarean delivery. °· Preeclampsia usually goes away after the baby is born. °HOME CARE INSTRUCTIONS  °· Only take over-the-counter or prescription medicines as directed by your health care provider. °· Lie on your left side while resting. This keeps pressure off your baby. °· Elevate your feet while resting. °· Get regular exercise. Ask your health care provider what type of exercise is safe for you. °· Avoid caffeine and alcohol. °· Do not smoke. °· Drink 6-8 glasses of water every day. °· Eat a balanced diet   that is low in salt. Do not add salt to your food. °· Avoid stressful situations as much as possible. °· Get plenty of rest and sleep. °· Keep all prenatal appointments and tests as scheduled. °SEEK MEDICAL CARE IF: °· You are gaining more weight than expected. °· You have any headaches, abdominal pain, or  nausea. °· You are bruising more than usual. °· You feel dizzy or light-headed. °SEEK IMMEDIATE MEDICAL CARE IF:  °· You develop sudden or severe swelling anywhere in your body. This usually happens in the legs. °· You gain 5 lb (2.3 kg) or more in a week. °· You have a severe headache, dizziness, problems with your vision, or confusion. °· You have severe abdominal pain. °· You have lasting nausea or vomiting. °· You have a seizure. °· You have trouble moving any part of your body. °· You develop numbness in your body. °· You have trouble speaking. °· You have any abnormal bleeding. °· You develop a stiff neck. °· You pass out. °MAKE SURE YOU:  °· Understand these instructions. °· Will watch your condition. °· Will get help right away if you are not doing well or get worse. °  °This information is not intended to replace advice given to you by your health care provider. Make sure you discuss any questions you have with your health care provider. °  °Document Released: 12/26/1999 Document Revised: 01/02/2013 Document Reviewed: 10/20/2012 °Elsevier Interactive Patient Education ©2016 [employer] °Vaginal Delivery, Care After °Refer to this sheet in the next few weeks. These discharge instructions provide you with information on caring for yourself after delivery. Your health care provider may also give you specific instructions. Your treatment has been planned according to the most current medical practices available, but problems sometimes occur. Call your health care provider if you have any problems or questions after you go home. °HOME CARE INSTRUCTIONS °· Take over-the-counter or prescription medicines only as directed by your health care provider or pharmacist. °· Do not drink alcohol, especially if you are breastfeeding or taking medicine to relieve pain. °· Do not chew or smoke tobacco. °· Do not use illegal drugs. °· Continue to use good perineal care. Good perineal care includes: °· Wiping your perineum  from front to back. °· Keeping your perineum clean. °· Do not use tampons or douche until your health care provider says it is okay. °· Shower, wash your hair, and take tub baths as directed by your health care provider. °· Wear a well-fitting bra that provides breast support. °· Eat healthy foods. °· Drink enough fluids to keep your urine clear or pale yellow. °· Eat high-fiber foods such as whole grain cereals and breads, brown rice, beans, and fresh fruits and vegetables every day. These foods may help prevent or relieve constipation. °· Follow your health care provider's recommendations regarding resumption of activities such as climbing stairs, driving, lifting, exercising, or traveling. °· Talk to your health care provider about resuming sexual activities. Resumption of sexual activities is dependent upon your risk of infection, your rate of healing, and your comfort and desire to resume sexual activity. °· Try to have someone help you with your household activities and your newborn for at least a few days after you leave the hospital. °· Rest as much as possible. Try to rest or take a nap when your newborn is sleeping. °· Increase your activities gradually. °· Keep all of your scheduled postpartum appointments. It is very important to keep your scheduled follow-up   appointments. At these appointments, your health care provider will be checking to make sure that you are healing physically and emotionally. °SEEK MEDICAL CARE IF:  °· You are passing large clots from your vagina. Save any clots to show your health care provider. °· You have a foul smelling discharge from your vagina. °· You have trouble urinating. °· You are urinating frequently. °· You have pain when you urinate. °· You have a change in your bowel movements. °· You have increasing redness, pain, or swelling near your vaginal incision (episiotomy) or vaginal tear. °· You have pus draining from your episiotomy or vaginal tear. °· Your episiotomy or  vaginal tear is separating. °· You have painful, hard, or reddened breasts. °· You have a severe headache. °· You have blurred vision or see spots. °· You feel sad or depressed. °· You have thoughts of hurting yourself or your newborn. °· You have questions about your care, the care of your newborn, or medicines. °· You are dizzy or light-headed. °· You have a rash. °· You have nausea or vomiting. °· You were breastfeeding and have not had a menstrual period within 12 weeks after you stopped breastfeeding. °· You are not breastfeeding and have not had a menstrual period by the 12th week after delivery. °· You have a fever. °SEEK IMMEDIATE MEDICAL CARE IF:  °· You have persistent pain. °· You have chest pain. °· You have shortness of breath. °· You faint. °· You have leg pain. °· You have stomach pain. °· Your vaginal bleeding saturates two or more sanitary pads in 1 hour. °  °This information is not intended to replace advice given to you by your health care provider. Make sure you discuss any questions you have with your health care provider. °  °Document Released: 12/26/1999 Document Revised: 09/18/2014 Document Reviewed: 08/25/2011 °Elsevier Interactive Patient Education ©2016 [employer] ° °

## 2015-03-28 NOTE — Progress Notes (Signed)
Patient discharged home with mother... Discharge instructions reviewed with patient and she verbalized understanding... Condition stable... No equipment... Ambulated to car with Cherlyn LabellaS. Bertrand, NT.

## 2015-03-28 NOTE — Discharge Summary (Signed)
OB Discharge Summary     Patient Name: Margaret Keller DOB: September 23, 1995 MRN: [id number]  Date of admission: 03/24/2015 Delivering MD: [place], MICHAEL J   Date of discharge: 03/28/2015  Admitting diagnosis: 36WKS STOMACH PAIN, BACK PAIN, AND NO FETAL MVMNT Intrauterine pregnancy: [redacted]w[redacted]d     Secondary diagnosis:  Principal Problem:   SVD (spontaneous vaginal delivery) Active Problems:   History of preterm delivery, currently pregnant   Supervision of high-risk pregnancy   Cervical insufficiency during pregnancy, antepartum   Late prenatal care affecting pregnancy, antepartum   Cervical cerclage suture present (placed 11/19/14), antepartum   Severe preeclampsia  Additional problems: none     Discharge diagnosis: Term Pregnancy Delivered                                                                                                Post partum procedures: 24 hours PP magnesium  Augmentation: AROM, Pitocin, Cytotec and Foley Balloon  Complications: None  Hospital course:  Induction of Labor With Vaginal Delivery   20 y.o. yo [phone] at [redacted]w[redacted]d was admitted to the hospital 03/24/2015 for induction of labor.  Indication for induction: Preeclampsia w severe features (ha, vision change). No severe-range BPs. Did receive Mg in labor and 24 hrs pp.   Labor course as follows: Membrane Rupture Time/Date: 9:28 AM ,03/26/2015   Intrapartum Procedures: Episiotomy: None [1]                                         Lacerations:  1st degree [2]  Patient had delivery of a Viable infant.  Information for the patient's newborn:  Margaret Keller [[id number]]  Delivery Method: Vag-Spont   03/26/2015  Details of delivery can be found in separate delivery note.  Patient had a routine postpartum course. Patient is discharged home 03/28/2015.  BP wnl day of delivery. Will have home rn check on Monday and again in one week. PreE return precautions discussed.  Fever immediate postparum resolved  spontaneously. No signs triple I during labor, no other signs endometritis PP.    Physical exam  Filed Vitals:   03/27/15 1829 03/27/15 2112 03/28/15 0117 03/28/15 0540  BP: 118/60 118/65 127/64 127/72  Pulse: 78 91 83 68  Temp: 98.5 F (36.9 C) 98.7 F (37.1 C) 98.4 F (36.9 C) 98.4 F (36.9 C)  TempSrc: Oral Oral Oral Oral  Resp: Height:      Weight:    158 lb (71.668 kg)  SpO2: 99% 99% 99% 99%   General: alert, cooperative and no distress Lochia: appropriate Uterine Fundus: firm Incision: N/A DVT Evaluation: No cords or calf tenderness. No significant calf/ankle edema. Labs: Lab Results  Component Value Date   WBC 28.0* 03/26/2015   HGB 10.8* 03/26/2015   HCT 32.0* 03/26/2015   MCV 84.7 03/26/2015   PLT 135* 03/26/2015   CMP Latest Ref Rng 03/26/2015  Glucose 65 - 99 mg/dL 130(Q)  BUN 6 - 20 mg/dL <6(V)  Creatinine  0.44 - 1.00 mg/dL 1.610.67  Sodium 096135 - 045145 mmol/L 138  Potassium 3.5 - 5.1 mmol/L 4.4  Chloride 101 - 111 mmol/L 107  CO2 22 - 32 mmol/L 23  Calcium 8.9 - 10.3 mg/dL 7.2(L)  Total Protein 6.5 - 8.1 g/dL 6.6  Total Bilirubin 0.3 - 1.2 mg/dL 0.4  Alkaline Phos 38 - 126 U/L 126  AST 15 - 41 U/L 24  ALT 14 - 54 U/L 14    Discharge instruction: per After Visit Summary and "Baby and Me Booklet".  After visit meds:    Medication List    TAKE these medications        acetaminophen 325 MG tablet  Commonly known as:  TYLENOL  Take 2 tablets (650 mg total) by mouth once.     ibuprofen 600 MG tablet  Commonly known as:  ADVIL,MOTRIN  Take 1 tablet (600 mg total) by mouth every 6 (six) hours.     prenatal multivitamin Tabs tablet  Take 1 tablet by mouth daily at 12 noon.     senna-docusate 8.6-50 MG tablet  Commonly known as:  Senokot-S  Take 1 tablet by mouth at bedtime as needed for mild constipation.        Diet: routine diet  Activity: Advance as tolerated. Pelvic rest for 6 weeks.   Outpatient follow up:6 weeks Follow up  Appt:Future Appointments Date Time Provider Department Center  04/30/2015 2:00 PM Eino FarberWalidah Kennith GainN Karim, CNM [facility]   Follow up Visit:No Follow-up on file.  Postpartum contraception: iud  Newborn Data: Live born female  Birth Weight: 6 lb 7.2 oz (2925 g) APGAR: 7, 8  Baby Feeding: Breast Disposition:dispo pending   03/28/2015 Silvano BilisNoah B Tyja Gortney, MD

## 2015-04-30 ENCOUNTER — Ambulatory Visit: Payer: Medicaid Other | Admitting: Family

## 2016-01-12 NOTE — L&D Delivery Note (Signed)
Delivery Note At 2:35 AM a viable female was delivered via Vaginal, Spontaneous Delivery (Presentation:ROA to OA ;  ).  APGAR: 9, 10; weight  pending.   Placenta status: delivered intact with gentle traction.  Cord: 3 vc with the following complications: none.  Cord pH: NA  Anesthesia:  epidural Episiotomy: None Lacerations: 1st degree;Labial; hemostatic and not-repaired.  Suture Repair: NA Est. Blood Loss (mL): 100  Mom to postpartum.  Baby to Couplet care / Skin to Skin.  Charlesetta GaribaldiKathryn Lorraine Nimisha Rathel CNM 09/21/2016, 3:22 AM

## 2016-04-20 ENCOUNTER — Encounter: Payer: Self-pay | Admitting: Family Medicine

## 2016-04-20 ENCOUNTER — Ambulatory Visit: Payer: Self-pay

## 2016-04-20 ENCOUNTER — Ambulatory Visit (INDEPENDENT_AMBULATORY_CARE_PROVIDER_SITE_OTHER): Payer: Self-pay

## 2016-04-20 DIAGNOSIS — Z3A14 14 weeks gestation of pregnancy: Secondary | ICD-10-CM

## 2016-04-20 DIAGNOSIS — Z3201 Encounter for pregnancy test, result positive: Secondary | ICD-10-CM

## 2016-04-20 LAB — POCT PREGNANCY, URINE: Preg Test, Ur: POSITIVE — AB

## 2016-04-20 NOTE — Progress Notes (Signed)
Patient presented to office today for a pregnancy test. Test confirms she is pregnant around 14 weeks according to her last period. Patient was advised to start taking prenatal vitamins and to always call before stating any medications. A letter of verification has been giving to patient.

## 2016-05-05 ENCOUNTER — Encounter: Payer: Self-pay | Admitting: Family Medicine

## 2016-05-11 ENCOUNTER — Encounter (HOSPITAL_COMMUNITY): Payer: Self-pay | Admitting: *Deleted

## 2016-05-11 ENCOUNTER — Inpatient Hospital Stay (HOSPITAL_COMMUNITY)
Admission: AD | Admit: 2016-05-11 | Discharge: 2016-05-11 | Disposition: A | Discharge status: Home / Self Care | Payer: Medicaid Other | Source: Ambulatory Visit | Attending: Obstetrics and Gynecology | Admitting: Obstetrics and Gynecology

## 2016-05-11 ENCOUNTER — Inpatient Hospital Stay (HOSPITAL_COMMUNITY): Payer: Medicaid Other

## 2016-05-11 DIAGNOSIS — Z3A17 17 weeks gestation of pregnancy: Secondary | ICD-10-CM | POA: Insufficient documentation

## 2016-05-11 DIAGNOSIS — R109 Unspecified abdominal pain: Secondary | ICD-10-CM | POA: Insufficient documentation

## 2016-05-11 DIAGNOSIS — O2342 Unspecified infection of urinary tract in pregnancy, second trimester: Secondary | ICD-10-CM | POA: Insufficient documentation

## 2016-05-11 DIAGNOSIS — O26899 Other specified pregnancy related conditions, unspecified trimester: Secondary | ICD-10-CM

## 2016-05-11 DIAGNOSIS — O26892 Other specified pregnancy related conditions, second trimester: Secondary | ICD-10-CM | POA: Diagnosis not present

## 2016-05-11 DIAGNOSIS — Z8759 Personal history of other complications of pregnancy, childbirth and the puerperium: Secondary | ICD-10-CM

## 2016-05-11 DIAGNOSIS — O09299 Supervision of pregnancy with other poor reproductive or obstetric history, unspecified trimester: Secondary | ICD-10-CM | POA: Insufficient documentation

## 2016-05-11 DIAGNOSIS — O09292 Supervision of pregnancy with other poor reproductive or obstetric history, second trimester: Secondary | ICD-10-CM

## 2016-05-11 DIAGNOSIS — O09212 Supervision of pregnancy with history of pre-term labor, second trimester: Secondary | ICD-10-CM | POA: Diagnosis not present

## 2016-05-11 DIAGNOSIS — Z8742 Personal history of other diseases of the female genital tract: Secondary | ICD-10-CM

## 2016-05-11 LAB — DIFFERENTIAL
Basophils Absolute: 0 10*3/uL (ref 0.0–0.1)
Basophils Relative: 0 %
EOS PCT: 0 %
Eosinophils Absolute: 0 10*3/uL (ref 0.0–0.7)
Lymphocytes Relative: 17 %
Lymphs Abs: 2.7 10*3/uL (ref 0.7–4.0)
MONO ABS: 0.6 10*3/uL (ref 0.1–1.0)
MONOS PCT: 4 %
Neutro Abs: 12.3 10*3/uL — ABNORMAL HIGH (ref 1.7–7.7)
Neutrophils Relative %: 79 %

## 2016-05-11 LAB — URINALYSIS, ROUTINE W REFLEX MICROSCOPIC
Bacteria, UA: NONE SEEN
Bilirubin Urine: NEGATIVE
GLUCOSE, UA: NEGATIVE mg/dL
KETONES UR: 5 mg/dL — AB
Nitrite: NEGATIVE
PROTEIN: NEGATIVE mg/dL
Specific Gravity, Urine: 1.016 (ref 1.005–1.030)
pH: 6 (ref 5.0–8.0)

## 2016-05-11 LAB — CBC
HCT: 38.7 % (ref 36.0–46.0)
HEMOGLOBIN: 13.4 g/dL (ref 12.0–15.0)
MCH: 28.9 pg (ref 26.0–34.0)
MCHC: 34.6 g/dL (ref 30.0–36.0)
MCV: 83.4 fL (ref 78.0–100.0)
PLATELETS: 159 10*3/uL (ref 150–400)
RBC: 4.64 MIL/uL (ref 3.87–5.11)
RDW: 14.3 % (ref 11.5–15.5)
WBC: 15.5 10*3/uL — ABNORMAL HIGH (ref 4.0–10.5)

## 2016-05-11 LAB — WET PREP, GENITAL
Clue Cells Wet Prep HPF POC: NONE SEEN
SPERM: NONE SEEN
TRICH WET PREP: NONE SEEN
YEAST WET PREP: NONE SEEN

## 2016-05-11 MED ORDER — ASPIRIN 81 MG PO CHEW: 81.0000 mg | CHEWABLE_TABLET | Freq: Every day | ORAL | 1 refills | Status: DC

## 2016-05-11 MED ORDER — CEPHALEXIN 500 MG PO CAPS: 500.0000 mg | ORAL_CAPSULE | Freq: Four times a day (QID) | ORAL | 0 refills | Status: AC

## 2016-05-11 NOTE — Discharge Instructions (Signed)

## 2016-05-11 NOTE — MAU Note (Signed)
Been having real bad pain in lower abd and low back, occurs when she stands up.  Started this morning. Wasn't alarmed until she started bleeding, noted when she wiped. No care yet

## 2016-05-11 NOTE — MAU Provider Note (Signed)
History     CSN: [id number]  Arrival date and time: 05/11/16 1438   First Provider Initiated Contact with Patient 05/11/16 1525     [id number] .1 wks with abdominal cramping x2 days. Cramping is in lower abdomen and wraps around to the back. Pain is intermittent and she is unsure of frequency, but it lasts for 10 sec. Rates 5/10. She has not tried anything for it. Lying down improves the pain. Denies pelvic or vaginal pressure. She also reports VB about 3 hrs ago. She saw pink blood on the toilet paper. No recent IC. Reports thick whitevaginal discharge. No new partner in 4 years. She has not started [facility] d/t transportation issues but has an appt in 9 days. She has hx of pre-e with severe features and cervical cerclage with her last pregnancy and delivered at 36 wks. She also has hx of two second trimester losses.    OB History    Gravida Para Term Preterm AB Living   SAB TAB Ectopic Multiple Live Births         0 2      Past Medical History:  Diagnosis Date  . Cervical insufficiency during pregnancy, antepartum   . History of cervical cerclage   . Preterm labor     Past Surgical History:  Procedure Laterality Date  . CERVICAL CERCLAGE N/A 11/19/2014   Procedure: CERCLAGE CERVICAL;  Surgeon: Tereso Newcomer, MD;  Location: [facility];  Service: Gynecology;  Laterality: N/A;    Family History  Problem Relation Age of Onset  . Hypertension Mother   . Diabetes Father     Social History  Substance Use Topics  . Smoking status: Never Smoker  . Smokeless tobacco: Never Used  . Alcohol use No    Allergies: No Known Allergies  Prescriptions Prior to Admission  Medication Sig Dispense Refill Last Dose  . acetaminophen (TYLENOL) 325 MG tablet Take 2 tablets (650 mg total) by mouth once. (Patient not taking: Reported on 04/20/2016) 90 tablet 3 Not Taking  . ibuprofen (ADVIL,MOTRIN) 600 MG tablet Take 1 tablet (600 mg total) by mouth every 6 (six) hours. (Patient not  taking: Reported on 04/20/2016) 90 tablet 3 Not Taking  . Prenatal Vit-Fe Fumarate-FA (PRENATAL MULTIVITAMIN) TABS tablet Take 1 tablet by mouth daily at 12 noon.   Taking  . senna-docusate (SENOKOT-S) 8.6-50 MG tablet Take 1 tablet by mouth at bedtime as needed for mild constipation. (Patient not taking: Reported on 04/20/2016) 30 tablet 1 Not Taking    Review of Systems  Constitutional: Negative for chills and fever.  Gastrointestinal: Positive for abdominal pain and constipation. Negative for nausea and vomiting.  Genitourinary: Positive for frequency, urgency, vaginal bleeding and vaginal discharge. Negative for dysuria.  Musculoskeletal: Positive for back pain.   Physical Exam   Blood pressure 135/81, pulse (!) 124, temperature 98.8 F (37.1 C), temperature source Oral, resp. rate 18, weight 65.8 kg (145 lb), SpO2 98 %, unknown if currently breastfeeding.  Physical Exam  Constitutional: She is oriented to person, place, and time. She appears well-developed and well-nourished. No distress.  HENT:  Head: Normocephalic and atraumatic.  Neck: Normal range of motion. Neck supple.  Respiratory: Effort normal.  GI: Soft. She exhibits no distension. There is no tenderness.  Genitourinary:  Genitourinary Comments: External: no lesions or erythema Vagina: rugated, parous, copious yellow mucous discharge from os, no blood SVE: closed/50   Musculoskeletal: Normal range of motion.  Neurological: She is alert and oriented to person, place, and time.  Skin: Skin is warm and dry.  Psychiatric: She has a normal mood and affect.   FHT: 141 bpm  Results for orders placed or performed during the hospital encounter of 05/11/16 (from the past 24 hour(s))  Urinalysis, Routine w reflex microscopic     Status: Abnormal   Collection Time: 05/11/16  2:53 PM  Result Value Ref Range   Color, Urine YELLOW YELLOW   APPearance HAZY (A) CLEAR   Specific Gravity, Urine 1.016 1.005 - 1.030   pH 6.0 5.0 -  8.0   Glucose, UA NEGATIVE NEGATIVE mg/dL   Hgb urine dipstick LARGE (A) NEGATIVE   Bilirubin Urine NEGATIVE NEGATIVE   Ketones, ur 5 (A) NEGATIVE mg/dL   Protein, ur NEGATIVE NEGATIVE mg/dL   Nitrite NEGATIVE NEGATIVE   Leukocytes, UA LARGE (A) NEGATIVE   RBC / HPF 6-30 0 - 5 RBC/hpf   WBC, UA TOO NUMEROUS TO COUNT 0 - 5 WBC/hpf   Bacteria, UA NONE SEEN NONE SEEN   Squamous Epithelial / LPF 0-5 (A) NONE SEEN   Mucous PRESENT   Wet prep, genital     Status: Abnormal   Collection Time: 05/11/16  3:25 PM  Result Value Ref Range   Yeast Wet Prep HPF POC NONE SEEN NONE SEEN   Trich, Wet Prep NONE SEEN NONE SEEN   Clue Cells Wet Prep HPF POC NONE SEEN NONE SEEN   WBC, Wet Prep HPF POC MANY (A) NONE SEEN   Sperm NONE SEEN   CBC     Status: Abnormal   Collection Time: 05/11/16  3:37 PM  Result Value Ref Range   WBC 15.5 (H) 4.0 - 10.5 K/uL   RBC 4.64 3.87 - 5.11 MIL/uL   Hemoglobin 13.4 12.0 - 15.0 g/dL   HCT 96.0 45.4 - 09.8 %   MCV 83.4 78.0 - 100.0 fL   MCH 28.9 26.0 - 34.0 pg   MCHC 34.6 30.0 - 36.0 g/dL   RDW 11.9 14.7 - 82.9 %   Platelets 159 150 - 400 K/uL  Differential     Status: Abnormal   Collection Time: 05/11/16  3:37 PM  Result Value Ref Range   Neutrophils Relative % 79 %   Neutro Abs 12.3 (H) 1.7 - 7.7 K/uL   Lymphocytes Relative 17 %   Lymphs Abs 2.7 0.7 - 4.0 K/uL   Monocytes Relative 4 %   Monocytes Absolute 0.6 0.1 - 1.0 K/uL   Eosinophils Relative 0 %   Eosinophils Absolute 0.0 0.0 - 0.7 K/uL   Basophils Relative 0 %   Basophils Absolute 0.0 0.0 - 0.1 K/uL   [place] Mfm Ob Limited  Result Date: 05/11/2016 ----------------------------------------------------------------------  OBSTETRICS REPORT                      (Signed Final 05/11/2016 06:42 pm) ---------------------------------------------------------------------- Patient Info  ID #:       [id number]                         D.O.B.:   1995-11-09 (21 yrs)  Name:       Margaret Keller               Visit Date:   05/11/2016 05:30 pm ---------------------------------------------------------------------- Performed By  Performed By:     Tomma Lightning  Ref. Address:     [address]                                                             [place], [place]                                                             [address]  Attending:        Particia Nearing MD       Location:         [facility]  Referred By:      Lawernce Pitts CNM ---------------------------------------------------------------------- Orders   #  Description                                 Code   1  [place] MFM OB LIMITED                           [phone]  ----------------------------------------------------------------------   #  Ordered By               Order #        Accession #    Episode #   1  Donette Larry          [id number]      [id number]     [id number]  ---------------------------------------------------------------------- Indications   [redacted] weeks gestation of pregnancy                Z3A.17   Cervical incompetence, second trimester        O34.32   Abdominal pain in pregnancy                    O99.89  ---------------------------------------------------------------------- OB History  Blood Type:            Height:  5'5"   Weight (lb):  145      BMI:   24.13  Gravidity:    4         Prem:   3  Living:       1 ---------------------------------------------------------------------- Fetal Evaluation  Num Of Fetuses:     1  Fetal Heart         149  Rate(bpm):  Cardiac Activity:   Observed  Presentation:       Breech  Placenta:           Posterior  Amniotic Fluid  AFI FV:      Subjectively within normal limits                              Largest Pocket(cm)                              3.35  Comment:    Contration LUS, unable to determine location of placenta in              relation to cervix.  ---------------------------------------------------------------------- Biometry  BPD:      38.4  mm     G. Age:  17w 5d         75  % ---------------------------------------------------------------------- Gestational Age  LMP:           17w 1d       Date:   01/12/16                 EDD:   10/18/16  U/S Today:     17w 5d                                        EDD:   10/14/16  Best:          17w 1d    Det. By:   LMP  (01/12/16)          EDD:   10/18/16 ---------------------------------------------------------------------- Cervix Uterus Adnexa  Cervix  Appears closed.  Unable to accurately measure cervix due to LUS  contraction  Uterus  No abnormality visualized.  Left Ovary  Not visualized.  Right Ovary  Not visualized. ---------------------------------------------------------------------- Impression  SIUP at 17+1 weeks  Normal amniotic fluid volume  BPD = LMP dating  TA views of cervix: due to contraction in LUS, the relationship  between the inferior edge of the posterior placenta and the  cervix cannot be determined; cervical length appeared to be  long ----------------------------------------------------------------------                 Particia Nearing, MD Electronically Signed Final Report   05/11/2016 06:42 pm ----------------------------------------------------------------------   MAU Course  Procedures  MDM Labs ordered and reviewed. Will order outpt detailed [place] but no available appt in next 2 weeks so will obtain limited US today for CL. Pain likely d/t UTI, will treat with Keflex, UC ordered. Will start baby ASA for hx of pre-e. Stable for discharge home.  Assessment and Plan   1. [redacted] weeks gestation of pregnancy   2. History of cervical incompetence   3. Abdominal pain affecting pregnancy   4. Urinary tract infection in mother during second trimester of pregnancy    Discharge home [facility] tomorrow Follow up for outpt OB US Rx Keflex Rx ASA SAB precautions Increase water intake,  add cranberry juice  Allergies as of 05/11/2016   No Known Allergies     Medication List    STOP taking these medications   ibuprofen 600 MG tablet Commonly known as:  ADVIL,MOTRIN   senna-docusate 8.6-50 MG tablet Commonly known as:  Senokot-S     TAKE these medications   acetaminophen 325 MG tablet Commonly known as:  TYLENOL Take 2 tablets (650 mg total) by mouth once.   aspirin 81 MG chewable tablet Commonly known as:  ASPIRIN CHILDRENS Chew 1  tablet (81 mg total) by mouth daily.   cephALEXin 500 MG capsule Commonly known as:  KEFLEX Take 1 capsule (500 mg total) by mouth 4 (four) times daily.   prenatal multivitamin Tabs tablet Take 1 tablet by mouth daily at 12 noon.      Donette Larry, CNM 05/11/2016, 3:26 PM

## 2016-05-12 ENCOUNTER — Telehealth (HOSPITAL_COMMUNITY): Payer: Self-pay

## 2016-05-12 ENCOUNTER — Encounter: Payer: Self-pay | Admitting: Family Medicine

## 2016-05-12 ENCOUNTER — Encounter (HOSPITAL_COMMUNITY): Payer: Self-pay | Admitting: *Deleted

## 2016-05-12 ENCOUNTER — Other Ambulatory Visit: Payer: Self-pay | Admitting: Obstetrics and Gynecology

## 2016-05-12 ENCOUNTER — Ambulatory Visit (INDEPENDENT_AMBULATORY_CARE_PROVIDER_SITE_OTHER): Payer: Medicaid Other | Admitting: Family Medicine

## 2016-05-12 VITALS — BP 136/83 | HR 105 | Wt 147.0 lb

## 2016-05-12 DIAGNOSIS — O3432 Maternal care for cervical incompetence, second trimester: Secondary | ICD-10-CM

## 2016-05-12 DIAGNOSIS — O09299 Supervision of pregnancy with other poor reproductive or obstetric history, unspecified trimester: Secondary | ICD-10-CM

## 2016-05-12 DIAGNOSIS — O09212 Supervision of pregnancy with history of pre-term labor, second trimester: Secondary | ICD-10-CM

## 2016-05-12 DIAGNOSIS — O09899 Supervision of other high risk pregnancies, unspecified trimester: Secondary | ICD-10-CM

## 2016-05-12 DIAGNOSIS — O3430 Maternal care for cervical incompetence, unspecified trimester: Secondary | ICD-10-CM

## 2016-05-12 DIAGNOSIS — O09292 Supervision of pregnancy with other poor reproductive or obstetric history, second trimester: Secondary | ICD-10-CM

## 2016-05-12 DIAGNOSIS — O09219 Supervision of pregnancy with history of pre-term labor, unspecified trimester: Secondary | ICD-10-CM

## 2016-05-12 DIAGNOSIS — O0992 Supervision of high risk pregnancy, unspecified, second trimester: Secondary | ICD-10-CM

## 2016-05-12 DIAGNOSIS — O0990 Supervision of high risk pregnancy, unspecified, unspecified trimester: Secondary | ICD-10-CM | POA: Insufficient documentation

## 2016-05-12 LAB — GC/CHLAMYDIA PROBE AMP (~~LOC~~) NOT AT ARMC
Chlamydia: POSITIVE — AB
Neisseria Gonorrhea: NEGATIVE

## 2016-05-12 LAB — CULTURE, OB URINE: CULTURE: NO GROWTH

## 2016-05-12 MED ORDER — AZITHROMYCIN 250 MG PO TABS: 1000.0000 mg | ORAL_TABLET | Freq: Once | ORAL | 0 refills | Status: AC

## 2016-05-12 MED ORDER — HYDROXYPROGESTERONE CAPROATE 250 MG/ML IM OIL
250.0000 mg | TOPICAL_OIL | INTRAMUSCULAR | Status: DC
  Administered 2016-06-09 – 2016-07-26 (×3): 250 mg via INTRAMUSCULAR

## 2016-05-12 NOTE — Patient Instructions (Signed)
 Second Trimester of Pregnancy The second trimester is from week 14 through week 27 (months 4 through 6). The second trimester is often a time when you feel your best. Your body has adjusted to being pregnant, and you begin to feel better physically. Usually, morning sickness has lessened or quit completely, you may have more energy, and you may have an increase in appetite. The second trimester is also a time when the fetus is growing rapidly. At the end of the sixth month, the fetus is about 9 inches long and weighs about 1 pounds. You will likely begin to feel the baby move (quickening) between 16 and 20 weeks of pregnancy. Body changes during your second trimester Your body continues to go through many changes during your second trimester. The changes vary from woman to woman.  Your weight will continue to increase. You will notice your lower abdomen bulging out.  You may begin to get stretch marks on your hips, abdomen, and breasts.  You may develop headaches that can be relieved by medicines. The medicines should be approved by your health care provider.  You may urinate more often because the fetus is pressing on your bladder.  You may develop or continue to have heartburn as a result of your pregnancy.  You may develop constipation because certain hormones are causing the muscles that push waste through your intestines to slow down.  You may develop hemorrhoids or swollen, bulging veins (varicose veins).  You may have back pain. This is caused by: ? Weight gain. ? Pregnancy hormones that are relaxing the joints in your pelvis. ? A shift in weight and the muscles that support your balance.  Your breasts will continue to grow and they will continue to become tender.  Your gums may bleed and may be sensitive to brushing and flossing.  Dark spots or blotches (chloasma, mask of pregnancy) may develop on your face. This will likely fade after the baby is born.  A dark line from  your belly button to the pubic area (linea nigra) may appear. This will likely fade after the baby is born.  You may have changes in your hair. These can include thickening of your hair, rapid growth, and changes in texture. Some women also have hair loss during or after pregnancy, or hair that feels dry or thin. Your hair will most likely return to normal after your baby is born.  What to expect at prenatal visits During a routine prenatal visit:  You will be weighed to make sure you and the fetus are growing normally.  Your blood pressure will be taken.  Your abdomen will be measured to track your baby's growth.  The fetal heartbeat will be listened to.  Any test results from the previous visit will be discussed.  Your health care provider may ask you:  How you are feeling.  If you are feeling the baby move.  If you have had any abnormal symptoms, such as leaking fluid, bleeding, severe headaches, or abdominal cramping.  If you are using any tobacco products, including cigarettes, chewing tobacco, and electronic cigarettes.  If you have any questions.  Other tests that may be performed during your second trimester include:  Blood tests that check for: ? Low iron levels (anemia). ? High blood sugar that affects pregnant women (gestational diabetes) between 24 and 28 weeks. ? Rh antibodies. This is to check for a protein on red blood cells (Rh factor).  Urine tests to check for infections, diabetes,   or protein in the urine.  An ultrasound to confirm the proper growth and development of the baby.  An amniocentesis to check for possible genetic problems.  Fetal screens for spina bifida and Down syndrome.  HIV (human immunodeficiency virus) testing. Routine prenatal testing includes screening for HIV, unless you choose not to have this test.  Follow these instructions at home: Medicines  Follow your health care provider's instructions regarding medicine use. Specific  medicines may be either safe or unsafe to take during pregnancy.  Take a prenatal vitamin that contains at least 600 micrograms (mcg) of folic acid.  If you develop constipation, try taking a stool softener if your health care provider approves. Eating and drinking  Eat a balanced diet that includes fresh fruits and vegetables, whole grains, good sources of protein such as meat, eggs, or tofu, and low-fat dairy. Your health care provider will help you determine the amount of weight gain that is right for you.  Avoid raw meat and uncooked cheese. These carry germs that can cause birth defects in the baby.  If you have low calcium intake from food, talk to your health care provider about whether you should take a daily calcium supplement.  Limit foods that are high in fat and processed sugars, such as fried and sweet foods.  To prevent constipation: ? Drink enough fluid to keep your urine clear or pale yellow. ? Eat foods that are high in fiber, such as fresh fruits and vegetables, whole grains, and beans. Activity  Exercise only as directed by your health care provider. Most women can continue their usual exercise routine during pregnancy. Try to exercise for 30 minutes at least 5 days a week. Stop exercising if you experience uterine contractions.  Avoid heavy lifting, wear low heel shoes, and practice good posture.  A sexual relationship may be continued unless your health care provider directs you otherwise. Relieving pain and discomfort  Wear a good support bra to prevent discomfort from breast tenderness.  Take warm sitz baths to soothe any pain or discomfort caused by hemorrhoids. Use hemorrhoid cream if your health care provider approves.  Rest with your legs elevated if you have leg cramps or low back pain.  If you develop varicose veins, wear support hose. Elevate your feet for 15 minutes, 3-4 times a day. Limit salt in your diet. Prenatal Care  Write down your questions.  Take them to your prenatal visits.  Keep all your prenatal visits as told by your health care provider. This is important. Safety  Wear your seat belt at all times when driving.  Make a list of emergency phone numbers, including numbers for family, friends, the hospital, and police and fire departments. General instructions  Ask your health care provider for a referral to a local prenatal education class. Begin classes no later than the beginning of month 6 of your pregnancy.  Ask for help if you have counseling or nutritional needs during pregnancy. Your health care provider can offer advice or refer you to specialists for help with various needs.  Do not use hot tubs, steam rooms, or saunas.  Do not douche or use tampons or scented sanitary pads.  Do not cross your legs for long periods of time.  Avoid cat litter boxes and soil used by cats. These carry germs that can cause birth defects in the baby and possibly loss of the fetus by miscarriage or stillbirth.  Avoid all smoking, herbs, alcohol, and unprescribed drugs. Chemicals in these products   can affect the formation and growth of the baby.  Do not use any products that contain nicotine or tobacco, such as cigarettes and e-cigarettes. If you need help quitting, ask your health care provider.  Visit your dentist if you have not gone yet during your pregnancy. Use a soft toothbrush to brush your teeth and be gentle when you floss. Contact a health care provider if:  You have dizziness.  You have mild pelvic cramps, pelvic pressure, or nagging pain in the abdominal area.  You have persistent nausea, vomiting, or diarrhea.  You have a bad smelling vaginal discharge.  You have pain when you urinate. Get help right away if:  You have a fever.  You are leaking fluid from your vagina.  You have spotting or bleeding from your vagina.  You have severe abdominal cramping or pain.  You have rapid weight gain or weight  loss.  You have shortness of breath with chest pain.  You notice sudden or extreme swelling of your face, hands, ankles, feet, or legs.  You have not felt your baby move in over an hour.  You have severe headaches that do not go away when you take medicine.  You have vision changes. Summary  The second trimester is from week 14 through week 27 (months 4 through 6). It is also a time when the fetus is growing rapidly.  Your body goes through many changes during pregnancy. The changes vary from woman to woman.  Avoid all smoking, herbs, alcohol, and unprescribed drugs. These chemicals affect the formation and growth your baby.  Do not use any tobacco products, such as cigarettes, chewing tobacco, and e-cigarettes. If you need help quitting, ask your health care provider.  Contact your health care provider if you have any questions. Keep all prenatal visits as told by your health care provider. This is important. This information is not intended to replace advice given to you by your health care provider. Make sure you discuss any questions you have with your health care provider. Document Released: 12/22/2000 Document Revised: 06/05/2015 Document Reviewed: 02/29/2012 [employer] Interactive Patient Education  2017 [employer]   Breastfeeding Deciding to breastfeed is one of the best choices you can make for you and your baby. A change in hormones during pregnancy causes your breast tissue to grow and increases the number and size of your milk ducts. These hormones also allow proteins, sugars, and fats from your blood supply to make breast milk in your milk-producing glands. Hormones prevent breast milk from being released before your baby is born as well as prompt milk flow after birth. Once breastfeeding has begun, thoughts of your baby, as well as his or her sucking or crying, can stimulate the release of milk from your milk-producing glands. Benefits of breastfeeding For Your  Baby  Your first milk (colostrum) helps your baby's digestive system function better.  There are antibodies in your milk that help your baby fight off infections.  Your baby has a lower incidence of asthma, allergies, and sudden infant death syndrome.  The nutrients in breast milk are better for your baby than infant formulas and are designed uniquely for your baby's needs.  Breast milk improves your baby's brain development.  Your baby is less likely to develop other conditions, such as childhood obesity, asthma, or type 2 diabetes mellitus.  For You  Breastfeeding helps to create a very special bond between you and your baby.  Breastfeeding is convenient. Breast milk is always available at   the correct temperature and costs nothing.  Breastfeeding helps to burn calories and helps you lose the weight gained during pregnancy.  Breastfeeding makes your uterus contract to its prepregnancy size faster and slows bleeding (lochia) after you give birth.  Breastfeeding helps to lower your risk of developing type 2 diabetes mellitus, osteoporosis, and breast or ovarian cancer later in life.  Signs that your baby is hungry Early Signs of Hunger  Increased alertness or activity.  Stretching.  Movement of the head from side to side.  Movement of the head and opening of the mouth when the corner of the mouth or cheek is stroked (rooting).  Increased sucking sounds, smacking lips, cooing, sighing, or squeaking.  Hand-to-mouth movements.  Increased sucking of fingers or hands.  Late Signs of Hunger  Fussing.  Intermittent crying.  Extreme Signs of Hunger Signs of extreme hunger will require calming and consoling before your baby will be able to breastfeed successfully. Do not wait for the following signs of extreme hunger to occur before you initiate breastfeeding:  Restlessness.  A loud, strong cry.  Screaming.  Breastfeeding basics Breastfeeding Initiation  Find a  comfortable place to sit or lie down, with your neck and back well supported.  Place a pillow or rolled up blanket under your baby to bring him or her to the level of your breast (if you are seated). Nursing pillows are specially designed to help support your arms and your baby while you breastfeed.  Make sure that your baby's abdomen is facing your abdomen.  Gently massage your breast. With your fingertips, massage from your chest wall toward your nipple in a circular motion. This encourages milk flow. You may need to continue this action during the feeding if your milk flows slowly.  Support your breast with 4 fingers underneath and your thumb above your nipple. Make sure your fingers are well away from your nipple and your baby's mouth.  Stroke your baby's lips gently with your finger or nipple.  When your baby's mouth is open wide enough, quickly bring your baby to your breast, placing your entire nipple and as much of the colored area around your nipple (areola) as possible into your baby's mouth. ? More areola should be visible above your baby's upper lip than below the lower lip. ? Your baby's tongue should be between his or her lower gum and your breast.  Ensure that your baby's mouth is correctly positioned around your nipple (latched). Your baby's lips should create a seal on your breast and be turned out (everted).  It is common for your baby to suck about 2-3 minutes in order to start the flow of breast milk.  Latching Teaching your baby how to latch on to your breast properly is very important. An improper latch can cause nipple pain and decreased milk supply for you and poor weight gain in your baby. Also, if your baby is not latched onto your nipple properly, he or she may swallow some air during feeding. This can make your baby fussy. Burping your baby when you switch breasts during the feeding can help to get rid of the air. However, teaching your baby to latch on properly is  still the best way to prevent fussiness from swallowing air while breastfeeding. Signs that your baby has successfully latched on to your nipple:  Silent tugging or silent sucking, without causing you pain.  Swallowing heard between every 3-4 sucks.  Muscle movement above and in front of his or her   ears while sucking.  Signs that your baby has not successfully latched on to nipple:  Sucking sounds or smacking sounds from your baby while breastfeeding.  Nipple pain.  If you think your baby has not latched on correctly, slip your finger into the corner of your baby's mouth to break the suction and place it between your baby's gums. Attempt breastfeeding initiation again. Signs of Successful Breastfeeding Signs from your baby:  A gradual decrease in the number of sucks or complete cessation of sucking.  Falling asleep.  Relaxation of his or her body.  Retention of a small amount of milk in his or her mouth.  Letting go of your breast by himself or herself.  Signs from you:  Breasts that have increased in firmness, weight, and size 1-3 hours after feeding.  Breasts that are softer immediately after breastfeeding.  Increased milk volume, as well as a change in milk consistency and color by the fifth day of breastfeeding.  Nipples that are not sore, cracked, or bleeding.  Signs That Your Baby is Getting Enough Milk  Wetting at least 1-2 diapers during the first 24 hours after birth.  Wetting at least 5-6 diapers every 24 hours for the first week after birth. The urine should be clear or pale yellow by 5 days after birth.  Wetting 6-8 diapers every 24 hours as your baby continues to grow and develop.  At least 3 stools in a 24-hour period by age 5 days. The stool should be soft and yellow.  At least 3 stools in a 24-hour period by age 7 days. The stool should be seedy and yellow.  No loss of weight greater than 10% of birth weight during the first 3 days of age.  Average  weight gain of 4-7 ounces (113-198 g) per week after age 4 days.  Consistent daily weight gain by age 5 days, without weight loss after the age of 2 weeks.  After a feeding, your baby may spit up a small amount. This is common. Breastfeeding frequency and duration Frequent feeding will help you make more milk and can prevent sore nipples and breast engorgement. Breastfeed when you feel the need to reduce the fullness of your breasts or when your baby shows signs of hunger. This is called "breastfeeding on demand." Avoid introducing a pacifier to your baby while you are working to establish breastfeeding (the first 4-6 weeks after your baby is born). After this time you may choose to use a pacifier. Research has shown that pacifier use during the first year of a baby's life decreases the risk of sudden infant death syndrome (SIDS). Allow your baby to feed on each breast as long as he or she wants. Breastfeed until your baby is finished feeding. When your baby unlatches or falls asleep while feeding from the first breast, offer the second breast. Because newborns are often sleepy in the first few weeks of life, you may need to awaken your baby to get him or her to feed. Breastfeeding times will vary from baby to baby. However, the following rules can serve as a guide to help you ensure that your baby is properly fed:  Newborns (babies 4 weeks of age or younger) may breastfeed every 1-3 hours.  Newborns should not go longer than 3 hours during the day or 5 hours during the night without breastfeeding.  You should breastfeed your baby a minimum of 8 times in a 24-hour period until you begin to introduce solid foods to your   baby at around 6 months of age.  Breast milk pumping Pumping and storing breast milk allows you to ensure that your baby is exclusively fed your breast milk, even at times when you are unable to breastfeed. This is especially important if you are going back to work while you are still  breastfeeding or when you are not able to be present during feedings. Your lactation consultant can give you guidelines on how long it is safe to store breast milk. A breast pump is a machine that allows you to pump milk from your breast into a sterile bottle. The pumped breast milk can then be stored in a refrigerator or freezer. Some breast pumps are operated by hand, while others use electricity. Ask your lactation consultant which type will work best for you. Breast pumps can be purchased, but some hospitals and breastfeeding support groups lease breast pumps on a monthly basis. A lactation consultant can teach you how to hand express breast milk, if you prefer not to use a pump. Caring for your breasts while you breastfeed Nipples can become dry, cracked, and sore while breastfeeding. The following recommendations can help keep your breasts moisturized and healthy:  Avoid using soap on your nipples.  Wear a supportive bra. Although not required, special nursing bras and tank tops are designed to allow access to your breasts for breastfeeding without taking off your entire bra or top. Avoid wearing underwire-style bras or extremely tight bras.  Air dry your nipples for 3-4minutes after each feeding.  Use only cotton bra pads to absorb leaked breast milk. Leaking of breast milk between feedings is normal.  Use lanolin on your nipples after breastfeeding. Lanolin helps to maintain your skin's normal moisture barrier. If you use pure lanolin, you do not need to wash it off before feeding your baby again. Pure lanolin is not toxic to your baby. You may also hand express a few drops of breast milk and gently massage that milk into your nipples and allow the milk to air dry.  In the first few weeks after giving birth, some women experience extremely full breasts (engorgement). Engorgement can make your breasts feel heavy, warm, and tender to the touch. Engorgement peaks within 3-5 days after you give  birth. The following recommendations can help ease engorgement:  Completely empty your breasts while breastfeeding or pumping. You may want to start by applying warm, moist heat (in the shower or with warm water-soaked hand towels) just before feeding or pumping. This increases circulation and helps the milk flow. If your baby does not completely empty your breasts while breastfeeding, pump any extra milk after he or she is finished.  Wear a snug bra (nursing or regular) or tank top for 1-2 days to signal your body to slightly decrease milk production.  Apply ice packs to your breasts, unless this is too uncomfortable for you.  Make sure that your baby is latched on and positioned properly while breastfeeding.  If engorgement persists after 48 hours of following these recommendations, contact your health care provider or a lactation consultant. Overall health care recommendations while breastfeeding  Eat healthy foods. Alternate between meals and snacks, eating 3 of each per day. Because what you eat affects your breast milk, some of the foods may make your baby more irritable than usual. Avoid eating these foods if you are sure that they are negatively affecting your baby.  Drink milk, fruit juice, and water to satisfy your thirst (about 10 glasses a day).    Rest often, relax, and continue to take your prenatal vitamins to prevent fatigue, stress, and anemia.  Continue breast self-awareness checks.  Avoid chewing and smoking tobacco. Chemicals from cigarettes that pass into breast milk and exposure to secondhand smoke may harm your baby.  Avoid alcohol and drug use, including marijuana. Some medicines that may be harmful to your baby can pass through breast milk. It is important to ask your health care provider before taking any medicine, including all over-the-counter and prescription medicine as well as vitamin and herbal supplements. It is possible to become pregnant while breastfeeding.  If birth control is desired, ask your health care provider about options that will be safe for your baby. Contact a health care provider if:  You feel like you want to stop breastfeeding or have become frustrated with breastfeeding.  You have painful breasts or nipples.  Your nipples are cracked or bleeding.  Your breasts are red, tender, or warm.  You have a swollen area on either breast.  You have a fever or chills.  You have nausea or vomiting.  You have drainage other than breast milk from your nipples.  Your breasts do not become full before feedings by the fifth day after you give birth.  You feel sad and depressed.  Your baby is too sleepy to eat well.  Your baby is having trouble sleeping.  Your baby is wetting less than 3 diapers in a 24-hour period.  Your baby has less than 3 stools in a 24-hour period.  Your baby's skin or the white part of his or her eyes becomes yellow.  Your baby is not gaining weight by 5 days of age. Get help right away if:  Your baby is overly tired (lethargic) and does not want to wake up and feed.  Your baby develops an unexplained fever. This information is not intended to replace advice given to you by your health care provider. Make sure you discuss any questions you have with your health care provider. Document Released: 12/28/2004 Document Revised: 06/11/2015 Document Reviewed: 06/21/2012 [employer] Interactive Patient Education  2017 [employer]  

## 2016-05-12 NOTE — Progress Notes (Signed)
   Subjective:    Margaret Keller is a [id number] [redacted]w[redacted]d being seen today for her first obstetrical visit.  Her obstetrical history is significant for incompetent cervix. Has h/o 20 and 22 wk loss, h/o Preterm birth at 36 wks last pregnancy with cerclage and 17 P. Pregnancy history fully reviewed.  [name]  Vitals:   05/12/16 1001  BP: 136/83  Pulse: (!) 105  Weight: 147 lb (66.7 kg)    HISTORY: OB History  Gravida Para Term Preterm AB Living  SAB TAB Ectopic Multiple Live Births        0 2    # Outcome Date GA Lbr Len/2nd Weight Sex Delivery Anes PTL Lv  4 Current           3 Preterm 03/26/15 [redacted]w[redacted]d  6 lb 7.2 oz (2.925 kg) F Vag-Spont EPI  LIV  2 Preterm 05/31/14 [redacted]w[redacted]d 08:50 / 01:42 1 lb 2.4 oz (0.522 kg) M Vag-Spont None  ND     Birth Comments: 22 weeks. Eyes fused.  1 Preterm 10/23/13 [redacted]w[redacted]d 04:31 / 00:02 12.2 oz (0.346 kg) M Vag-Spont None  FD     Past Medical History:  Diagnosis Date  . Cervical insufficiency during pregnancy, antepartum   . History of cervical cerclage   . Preterm labor    Past Surgical History:  Procedure Laterality Date  . CERVICAL CERCLAGE N/A 11/19/2014   Procedure: CERCLAGE CERVICAL;  Surgeon: Tereso Newcomer, MD;  Location: [facility];  Service: Gynecology;  Laterality: N/A;   Family History  Problem Relation Age of Onset  . Hypertension Mother   . Diabetes Father      Exam    Uterus:     Pelvic Exam:    Skin: normal coloration and turgor, no rashes    Neurologic: normal   Extremities: normal strength, tone, and muscle mass   HEENT extra ocular movement intact and sclera clear, anicteric   Mouth/Teeth mucous membranes moist, pharynx normal without lesions and dental hygiene good   Neck supple   Cardiovascular: regular rate and rhythm   Respiratory:  appears well, vitals normal, no respiratory distress, acyanotic, normal RR, ear and throat exam is normal, neck free of mass or lymphadenopathy, chest  clear, no wheezing, crepitations, rhonchi, normal symmetric air entry   Abdomen: soft, non-tender; bowel sounds normal; no masses,  no organomegaly      Assessment/Plan:    Pregnancy: [id number] 1. Supervision of high risk pregnancy, antepartum New OB labs with Quad - Rubella screen - HIV antibody (with reflex) - Hepatitis B Surface AntiGEN - RPR - AFP, Quad Screen  2. History of pre-eclampsia in prior pregnancy, currently pregnant Begin Baby ASA--rx given in [facility]U  3. History of cervical incompetence in pregnancy, currently pregnant Book for cerclage--done for 05/13/16  4. History of preterm delivery, currently pregnant Ordered Makena    Reva Bores 05/12/2016

## 2016-05-12 NOTE — Progress Notes (Signed)
+   chlamydia  Azithromycin sent to the pharmacy.    Duane Lope, NP

## 2016-05-12 NOTE — Progress Notes (Signed)
Pt seen in [facility]U for vaginal bleeding yesterday. No bleeding today. 17 P application signed and faxed. Anatomy scan scheduled for May 16 at 115

## 2016-05-12 NOTE — Telephone Encounter (Signed)

## 2016-05-13 ENCOUNTER — Encounter (HOSPITAL_COMMUNITY): Payer: Self-pay | Admitting: *Deleted

## 2016-05-13 ENCOUNTER — Ambulatory Visit (HOSPITAL_COMMUNITY): Payer: Medicaid Other | Admitting: Certified Registered Nurse Anesthetist

## 2016-05-13 ENCOUNTER — Ambulatory Visit (HOSPITAL_COMMUNITY)
Admission: RE | Admit: 2016-05-13 | Discharge: 2016-05-13 | Disposition: A | Discharge status: Home / Self Care | Payer: Medicaid Other | Source: Ambulatory Visit | Attending: Obstetrics and Gynecology | Admitting: Obstetrics and Gynecology

## 2016-05-13 ENCOUNTER — Encounter (HOSPITAL_COMMUNITY)
Admission: RE | Disposition: A | Discharge status: Home / Self Care | Payer: Self-pay | Source: Ambulatory Visit | Attending: Obstetrics and Gynecology

## 2016-05-13 DIAGNOSIS — N883 Incompetence of cervix uteri: Secondary | ICD-10-CM | POA: Diagnosis present

## 2016-05-13 DIAGNOSIS — O3432 Maternal care for cervical incompetence, second trimester: Secondary | ICD-10-CM | POA: Diagnosis not present

## 2016-05-13 DIAGNOSIS — Z3A17 17 weeks gestation of pregnancy: Secondary | ICD-10-CM | POA: Diagnosis not present

## 2016-05-13 HISTORY — PX: CERVICAL CERCLAGE: SHX1329

## 2016-05-13 SURGERY — CERCLAGE, CERVIX, VAGINAL APPROACH
Anesthesia: Spinal | Site: Vagina

## 2016-05-13 MED ORDER — TRAMADOL HCL 50 MG PO TABS
ORAL_TABLET | ORAL | Status: AC
  Administered 2016-05-13: 50 mg via ORAL
  Filled 2016-05-13: qty 1

## 2016-05-13 MED ORDER — PHENYLEPHRINE 40 MCG/ML (10ML) SYRINGE FOR IV PUSH (FOR BLOOD PRESSURE SUPPORT)
PREFILLED_SYRINGE | INTRAVENOUS | Status: AC
  Filled 2016-05-13: qty 10

## 2016-05-13 MED ORDER — INDOMETHACIN 25 MG PO CAPS: 25.0000 mg | ORAL_CAPSULE | Freq: Three times a day (TID) | ORAL | 0 refills | Status: DC

## 2016-05-13 MED ORDER — TRAMADOL HCL 50 MG PO TABS: 50.0000 mg | ORAL_TABLET | Freq: Four times a day (QID) | ORAL | 0 refills | Status: DC | PRN

## 2016-05-13 MED ORDER — CEFAZOLIN SODIUM-DEXTROSE 2-4 GM/100ML-% IV SOLN
INTRAVENOUS | Status: AC
  Filled 2016-05-13: qty 100

## 2016-05-13 MED ORDER — LACTATED RINGERS IV SOLN: INTRAVENOUS | Status: DC

## 2016-05-13 MED ORDER — BUPIVACAINE IN DEXTROSE 0.75-8.25 % IT SOLN
INTRATHECAL | Status: DC | PRN
  Administered 2016-05-13: 1 mL via INTRATHECAL

## 2016-05-13 MED ORDER — FENTANYL CITRATE (PF) 100 MCG/2ML IJ SOLN
25.0000 ug | INTRAMUSCULAR | Status: DC | PRN
  Administered 2016-05-13 (×2): 25 ug via INTRAVENOUS

## 2016-05-13 MED ORDER — FENTANYL CITRATE (PF) 100 MCG/2ML IJ SOLN
INTRAMUSCULAR | Status: AC
  Administered 2016-05-13: 25 ug via INTRAVENOUS
  Filled 2016-05-13: qty 2

## 2016-05-13 MED ORDER — PHENYLEPHRINE HCL 10 MG/ML IJ SOLN
INTRAMUSCULAR | Status: DC | PRN
  Administered 2016-05-13: 40 ug via INTRAVENOUS

## 2016-05-13 MED ORDER — CEFAZOLIN SODIUM-DEXTROSE 2-3 GM-% IV SOLR
INTRAVENOUS | Status: DC | PRN
  Administered 2016-05-13: 2 g via INTRAVENOUS

## 2016-05-13 MED ORDER — INDOMETHACIN 25 MG PO CAPS
25.0000 mg | ORAL_CAPSULE | Freq: Once | ORAL | Status: AC
  Administered 2016-05-13: 25 mg via ORAL
  Filled 2016-05-13: qty 1

## 2016-05-13 MED ORDER — BUPIVACAINE IN DEXTROSE 0.75-8.25 % IT SOLN
INTRATHECAL | Status: AC
  Filled 2016-05-13: qty 2

## 2016-05-13 MED ORDER — TRAMADOL HCL 50 MG PO TABS
50.0000 mg | ORAL_TABLET | Freq: Once | ORAL | Status: AC
  Administered 2016-05-13: 50 mg via ORAL

## 2016-05-13 MED ORDER — LACTATED RINGERS IV SOLN
INTRAVENOUS | Status: DC
  Administered 2016-05-13 (×2): via INTRAVENOUS

## 2016-05-13 SURGICAL SUPPLY — 19 items
CANISTER SUCT 3000ML PPV (MISCELLANEOUS) ×3 IMPLANT
CLOTH BEACON ORANGE TIMEOUT ST (SAFETY) ×3 IMPLANT
COUNTER NEEDLE 1200 MAGNETIC (NEEDLE) ×3 IMPLANT
GLOVE BIO SURGEON ST LM GN SZ9 (GLOVE) ×3 IMPLANT
GLOVE BIOGEL PI IND STRL 7.0 (GLOVE) ×1 IMPLANT
GLOVE BIOGEL PI IND STRL 9 (GLOVE) ×2 IMPLANT
GLOVE BIOGEL PI INDICATOR 7.0 (GLOVE) ×2
GLOVE BIOGEL PI INDICATOR 9 (GLOVE) ×4
GOWN STRL REUS W/TWL 2XL LVL3 (GOWN DISPOSABLE) ×3 IMPLANT
GOWN STRL REUS W/TWL LRG LVL3 (GOWN DISPOSABLE) ×6 IMPLANT
PACK VAGINAL MINOR WOMEN LF (CUSTOM PROCEDURE TRAY) ×3 IMPLANT
PAD OB MATERNITY 4.3X12.25 (PERSONAL CARE ITEMS) ×3 IMPLANT
PAD PREP 24X48 CUFFED NSTRL (MISCELLANEOUS) ×3 IMPLANT
SUT PROLENE 0 CT 1 30 (SUTURE) ×3 IMPLANT
TOWEL OR 17X24 6PK STRL BLUE (TOWEL DISPOSABLE) ×6 IMPLANT
TRAY FOLEY CATH SILVER 14FR (SET/KITS/TRAYS/PACK) ×3 IMPLANT
TUBING NON-CON 1/4 X 20 CONN (TUBING) IMPLANT
TUBING NON-CON 1/4 X 20' CONN (TUBING)
YANKAUER SUCT BULB TIP NO VENT (SUCTIONS) IMPLANT

## 2016-05-13 NOTE — H&P (Signed)
Preoperative History and Physical  Margaret Keller is a 21 y.o. [phone] here for surgical management of Cerclage placement at 17 wk due to history of 2 second trimester losses.   No significant preoperative concerns. She had an u/s 5/1 at MFM that showed an apparent long closed cervix with a placenta whose relationship to the cervix is poorly defined due to myometrial contraction but no previa is suspected  Proposed surgery: McDonald Cerclage (Indicated by patient history)  Past Medical History:  Diagnosis Date  . Cervical insufficiency during pregnancy, antepartum   . History of cervical cerclage   . Preterm labor   . SVD (spontaneous vaginal delivery) 03/26/2015   x 1   Past Surgical History:  Procedure Laterality Date  . CERVICAL CERCLAGE N/A 11/19/2014   Procedure: CERCLAGE CERVICALw/spinal;  Surgeon: Tereso Newcomer, MD;  Location: [facility];  Service: Gynecology;  Laterality: N/A;   OB History  Gravida Para Term Preterm AB Living  4 3   3   1   SAB TAB Ectopic Multiple Live Births        0 2    # Outcome Date GA Lbr Len/2nd Weight Sex Delivery Anes PTL Lv  4 Current           3 Preterm 03/26/15 [redacted]w[redacted]d  6 lb 7.2 oz (2.925 kg) F Vag-Spont EPI  LIV  2 Preterm 05/31/14 [redacted]w[redacted]d 08:50 / 01:42 1 lb 2.4 oz (0.522 kg) M Vag-Spont None  ND     Birth Comments: 22 weeks. Eyes fused.  1 Preterm 10/23/13 [redacted]w[redacted]d 04:31 / 00:02 12.2 oz (0.346 kg) M Vag-Spont None  FD    Patient denies any other pertinent gynecologic issues.   Current Facility-Administered Medications on File Prior to Encounter  Medication Dose Route Frequency Provider Last Rate Last Dose  . hydroxyprogesterone caproate (MAKENA) 250 mg/mL injection 250 mg  250 mg Intramuscular Weekly Reva Bores, MD       Current Outpatient Prescriptions on File Prior to Encounter  Medication Sig Dispense Refill  . Prenatal Vit-Fe Fumarate-FA (PRENATAL MULTIVITAMIN) TABS tablet Take 1 tablet by mouth daily at 12 noon.    Marland Kitchen aspirin (ASPIRIN  CHILDRENS) 81 MG chewable tablet Chew 1 tablet (81 mg total) by mouth daily. (Patient not taking: Reported on 05/12/2016) 90 tablet 1  . cephALEXin (KEFLEX) 500 MG capsule Take 1 capsule (500 mg total) by mouth 4 (four) times daily. (Patient not taking: Reported on 05/12/2016) 28 capsule 0   No Known Allergies  Social History:   reports that she has never smoked. She has never used smokeless tobacco. She reports that she does not drink alcohol or use drugs.  Family History  Problem Relation Age of Onset  . Hypertension Mother   . Diabetes Father     Review of Systems: Noncontributory  PHYSICAL EXAM: Height 5\' 3"  (1.6 m), weight 147 lb (66.7 kg), last menstrual period 01/12/2016, unknown if currently breastfeeding. General appearance - alert, well appearing, and in no distress Chest - clear to auscultation, no wheezes, rales or rhonchi, symmetric air entry Heart - normal rate and regular rhythm Abdomen - soft, nontender, nondistended, no masses or organomegaly                     Gravid uterus c/w dates Pelvic - examination done 5/1 at MFM            [facility] negative,and Extremities - peripheral pulses normal, no pedal edema, no clubbing or cyanosis  Labs: Results for orders placed or performed during the hospital encounter of 05/11/16 (from the past 336 hour(s))  GC/Chlamydia probe amp ([place])not at [facility]   Collection Time: 05/11/16 12:00 AM  Result Value Ref Range   Chlamydia **POSITIVE** (A)    Neisseria gonorrhea Negative   Culture, OB Urine   Collection Time: 05/11/16  2:53 PM  Result Value Ref Range   Specimen Description OB CLEAN CATCH    Special Requests NONE    Culture      NO GROWTH Performed at [facility], [address], [place], [place] [address]    Report Status 05/12/2016 FINAL   Urinalysis, Routine w reflex microscopic   Collection Time: 05/11/16  2:53 PM  Result Value Ref Range   Color, Urine YELLOW YELLOW   APPearance HAZY (A) CLEAR   Specific  Gravity, Urine 1.016 1.005 - 1.030   pH 6.0 5.0 - 8.0   Glucose, UA NEGATIVE NEGATIVE mg/dL   Hgb urine dipstick LARGE (A) NEGATIVE   Bilirubin Urine NEGATIVE NEGATIVE   Ketones, ur 5 (A) NEGATIVE mg/dL   Protein, ur NEGATIVE NEGATIVE mg/dL   Nitrite NEGATIVE NEGATIVE   Leukocytes, UA LARGE (A) NEGATIVE   RBC / HPF 6-30 0 - 5 RBC/hpf   WBC, UA TOO NUMEROUS TO COUNT 0 - 5 WBC/hpf   Bacteria, UA NONE SEEN NONE SEEN   Squamous Epithelial / LPF 0-5 (A) NONE SEEN   Mucous PRESENT   Wet prep, genital   Collection Time: 05/11/16  3:25 PM  Result Value Ref Range   Yeast Wet Prep HPF POC NONE SEEN NONE SEEN   Trich, Wet Prep NONE SEEN NONE SEEN   Clue Cells Wet Prep HPF POC NONE SEEN NONE SEEN   WBC, Wet Prep HPF POC MANY (A) NONE SEEN   Sperm NONE SEEN   CBC   Collection Time: 05/11/16  3:37 PM  Result Value Ref Range   WBC 15.5 (H) 4.0 - 10.5 K/uL   RBC 4.64 3.87 - 5.11 MIL/uL   Hemoglobin 13.4 12.0 - 15.0 g/dL   HCT 60.4 54.0 - 98.1 %   MCV 83.4 78.0 - 100.0 fL   MCH 28.9 26.0 - 34.0 pg   MCHC 34.6 30.0 - 36.0 g/dL   RDW 19.1 47.8 - 29.5 %   Platelets 159 150 - 400 K/uL  Differential   Collection Time: 05/11/16  3:37 PM  Result Value Ref Range   Neutrophils Relative % 79 %   Neutro Abs 12.3 (H) 1.7 - 7.7 K/uL   Lymphocytes Relative 17 %   Lymphs Abs 2.7 0.7 - 4.0 K/uL   Monocytes Relative 4 %   Monocytes Absolute 0.6 0.1 - 1.0 K/uL   Eosinophils Relative 0 %   Eosinophils Absolute 0.0 0.0 - 0.7 K/uL   Basophils Relative 0 %   Basophils Absolute 0.0 0.0 - 0.1 K/uL    Imaging Studies: [place] Mfm Ob Limited  Result Date: 05/11/2016 ----------------------------------------------------------------------  OBSTETRICS REPORT                      (Signed Final 05/11/2016 06:42 pm) ---------------------------------------------------------------------- Patient Info  ID #:       [id number]                         D.O.B.:   1995-07-20 (21 yrs)  Name:       Margaret Keller  Visit Date:  05/11/2016 05:30 pm ---------------------------------------------------------------------- Performed By  Performed By:     Tomma Lightning             Ref. Address:     [address]                                                             [place], [place]                                                             [address]  Attending:        Particia Nearing MD       Location:         [facility]  Referred By:      Lawernce Pitts CNM ---------------------------------------------------------------------- Orders   #  Description                                 Code   1  [place] MFM OB LIMITED                           [phone]  ----------------------------------------------------------------------   #  Ordered By               Order #        Accession #    Episode #   1  Donette Larry          [id number]      [id number]     [id number]  ---------------------------------------------------------------------- Indications   [redacted] weeks gestation of pregnancy                Z3A.17   Cervical incompetence, second trimester        O34.32   Abdominal pain in pregnancy                    O99.89  ---------------------------------------------------------------------- OB History  Blood Type:            Height:  5'5"   Weight (lb):  145      BMI:   24.13  Gravidity:    4         Prem:   3  Living:       1 ---------------------------------------------------------------------- Fetal Evaluation  Num Of Fetuses:     1  Fetal Heart         149  Rate(bpm):  Cardiac  Activity:   Observed  Presentation:       Breech  Placenta:           Posterior  Amniotic Fluid  AFI FV:      Subjectively within normal limits                              Largest Pocket(cm)                              3.35  Comment:    Contration LUS, unable to determine location of placenta in              relation to cervix.  ---------------------------------------------------------------------- Biometry  BPD:      38.4  mm     G. Age:  17w 5d         75  % ---------------------------------------------------------------------- Gestational Age  LMP:           17w 1d       Date:   01/12/16                 EDD:   10/18/16  U/S Today:     17w 5d                                        EDD:   10/14/16  Best:          17w 1d    Det. By:   LMP  (01/12/16)          EDD:   10/18/16 ---------------------------------------------------------------------- Cervix Uterus Adnexa  Cervix  Appears closed.  Unable to accurately measure cervix due to LUS  contraction  Uterus  No abnormality visualized.  Left Ovary  Not visualized.  Right Ovary  Not visualized. ---------------------------------------------------------------------- Impression  SIUP at 17+1 weeks  Normal amniotic fluid volume  BPD = LMP dating  TA views of cervix: due to contraction in LUS, the relationship  between the inferior edge of the posterior placenta and the  cervix cannot be determined; cervical length appeared to be  long ----------------------------------------------------------------------                 Particia NearingMartha Decker, MD Electronically Signed Final Report   05/11/2016 06:42 pm ----------------------------------------------------------------------   Assessment: Patient Active Problem List   Diagnosis Date Noted  . Supervision of high risk pregnancy, antepartum 05/12/2016  . History of pre-eclampsia in prior pregnancy, currently pregnant 05/11/2016  . History of preterm delivery, currently pregnant 11/07/2014  . Cervical insufficiency during pregnancy, antepartum 11/07/2014  Chlamydia cervicitis, will treat at this time. While in RR.  Patient aware of dx , already, and has not been treated so far.  Plan: Patient will undergo surgical management with McDonald Cerclage. Patient has been counseled over procedure, as well as Positive Chlamydia, and agrees to procedure  after discussion of risks benefits and alternatives.Tilda Burrow.    Jacky Dross V, MD  05/13/2016 12:08 PM

## 2016-05-13 NOTE — Brief Op Note (Signed)
05/13/2016  4:07 PM  PATIENT:  Margaret Keller  21 y.o. female  PRE-OPERATIVE DIAGNOSIS:  History of Incompetent Cervix, cerclage indicated by history,   POST-OPERATIVE DIAGNOSIS: history of  INCOMPETENT CERVIX by history, prior pregnancy  PROCEDURE:  Procedure(s): CERCLAGE CERVICAL (N/A), mcDonald single stitch  SURGEON:  Surgeon(s) and Role:    * Tilda BurrowJohn Briaunna Grindstaff V, MD - Primary  PHYSICIAN ASSISTANT:   ASSISTANTS: none   ANESTHESIA:   none  EBL:  Total I/O In: 1000 [I.V.:1000] Out: 305 [Urine:300; Blood:5]  BLOOD ADMINISTERED:none  DRAINS: none   LOCAL MEDICATIONS USED:  NONE  SPECIMEN:  No Specimen  DISPOSITION OF SPECIMEN:  N/A  COUNTS:  YES  TOURNIQUET:  * No tourniquets in log *  DICTATION: .Dragon Dictation  PLAN OF CARE: Discharge to home after PACU  PATIENT DISPOSITION:  PACU - hemodynamically stable.   Delay start of Pharmacological VTE agent (>24hrs) due to surgical blood loss or risk of bleeding: not applicable blod type O POS

## 2016-05-13 NOTE — Op Note (Signed)
05/13/2016  4:07 PM  PATIENT:  Margaret Keller  21 y.o. female  PRE-OPERATIVE DIAGNOSIS:  History of Incompetent Cervix, cerclage indicated by history,   POST-OPERATIVE DIAGNOSIS: history of  INCOMPETENT CERVIX by history, prior pregnancy  PROCEDURE:  Procedure(s): CERCLAGE CERVICAL (N/A), mcDonald single stitch  SURGEON:  Surgeon(s) and Role:    * Tilda BurrowJohn Alizea Pell V, MD - Primary  PHYSICIAN ASSISTANT:   ASSISTANTS: none   ANESTHESIA:   none  EBL:  Total I/O In: 1000 [I.V.:1000] Out: 305 [Urine:300; Blood:5]  BLOOD ADMINISTERED:none  DRAINS: none   LOCAL MEDICATIONS USED:  NONE  SPECIMEN:  No Specimen  DISPOSITION OF SPECIMEN:  N/A  COUNTS:  YES  TOURNIQUET:  * No tourniquets in log *  DICTATION: .Dragon Dictation  PLAN OF CARE: Discharge to home after PACU  PATIENT DISPOSITION:  PACU - hemodynamically stable.   Delay start of Pharmacological VTE agent (>24hrs) due to surgical blood loss or risk of bleeding: not applicable blod type O POS  Indications 21 year old female with 2 prior pregnancy losses and second trimester, successful pregnancy 2016 after McDonald cerclage, now with history indicated repeat cerclage this pregnancy. Blood type O positive  Details of procedure patient was taken to the operating room, spinal anesthesia introduced patient placed in low lithotomy leg support, abdomen and perineum prepped and draped. Timeout was conducted. Ancef was administered at 2 g. The speculum was inserted, and cervix confirmed as being closed. There was evidence for no prior obstetric laceration 5:00 the cervix somewhat everted and flowered out from the laceration which extended up the cervix at 5:00 approximately 1.5 cm the cervix could be grasped and downward traction place. The 0 Prolene suture was then begun at 12:00 and placed in a circumferential fashion and a total of 6 bites being careful to get deep enough to get into the connective tissue of the cervix were not  deep enough to go through the cervix this was placed around the cervix in a clockwise fashion and then tied tied down securely attempting to be sure not to place excess traction.. The suture was tied down with a series of at least 10 square knots and 2 surgeon's knots were then cut 2 cm length beyond that for easy access for future removal. There was approximately 5 cc of bleeding from the grasping of the edges of the cervix with ring forceps for the downward traction. Blood type is confirmed again is Rh+. Patient will receive azithromycin 1 g by mouth prior to discharge due to her prior history of positive gonorrhea culture from 05/11/2016. And she will arrange for partner to be treated . Patient will be discharged home on Indocin 24 hours

## 2016-05-13 NOTE — Anesthesia Preprocedure Evaluation (Signed)
Anesthesia Evaluation  Patient identified by MRN, date of birth, ID band Patient awake    Reviewed: Allergy & Precautions, H&P , Patient's Chart, lab work & pertinent test results  Airway Mallampati: II  TM Distance: >3 FB Neck ROM: full    Dental no notable dental hx.    Pulmonary    Pulmonary exam normal breath sounds clear to auscultation       Cardiovascular Exercise Tolerance: Good  Rhythm:regular Rate:Normal     Neuro/Psych    GI/Hepatic   Endo/Other    Renal/GU      Musculoskeletal   Abdominal   Peds  Hematology   Anesthesia Other Findings   Reproductive/Obstetrics                             Anesthesia Physical Anesthesia Plan  ASA: II  Anesthesia Plan: Spinal   Post-op Pain Management:    Induction:   Airway Management Planned:   Additional Equipment:   Intra-op Plan:   Post-operative Plan:   Informed Consent: I have reviewed the patients History and Physical, chart, labs and discussed the procedure including the risks, benefits and alternatives for the proposed anesthesia with the patient or authorized representative who has indicated his/her understanding and acceptance.     Plan Discussed with:   Anesthesia Plan Comments: (  )        Anesthesia Quick Evaluation  

## 2016-05-13 NOTE — Anesthesia Procedure Notes (Addendum)
Spinal  Patient location during procedure: OR Staffing Anesthesiologist: Cristela BlueJACKSON, Margaret Wilms Preanesthetic Checklist Completed: patient identified, site marked, surgical consent, pre-op evaluation, timeout performed, IV checked, risks and benefits discussed and monitors and equipment checked Spinal Block Patient position: sitting Prep: DuraPrep Patient monitoring: heart rate, cardiac monitor, continuous pulse ox and blood pressure Approach: midline Location: L3-4 Injection technique: single-shot Needle Needle type: Sprotte  Needle gauge: 24 G Needle length: 9 cm Assessment Sensory level: T10 Additional Notes Spinal Dosage in OR  .75% Bupivicaine ml       1.0

## 2016-05-13 NOTE — Anesthesia Postprocedure Evaluation (Signed)
Anesthesia Post Note  Patient: Margaret Keller  Procedure(s) Performed: Procedure(s) (LRB): CERCLAGE CERVICAL (N/A)  Patient location during evaluation: PACU Anesthesia Type: Spinal Level of consciousness: awake Pain management: satisfactory to patient Vital Signs Assessment: post-procedure vital signs reviewed and stable Respiratory status: spontaneous breathing Cardiovascular status: blood pressure returned to baseline Postop Assessment: no headache and spinal receding Anesthetic complications: no       Last Vitals:  Vitals:   05/13/16 1700 05/13/16 1715  BP: 119/73 117/73  Pulse: 100 (!) 108  Resp: 17 17  Temp:      Last Pain:  Vitals:   05/13/16 1715  TempSrc:   PainSc: 2                  Yarelis Ambrosino EDWARD

## 2016-05-13 NOTE — Discharge Instructions (Signed)
Post Anesthesia Home Care Instructions  Activity: Get plenty of rest for the remainder of the day. A responsible individual must stay with you for 24 hours following the procedure.  For the next 24 hours, DO NOT: -Drive a car -Advertising copywriter -Drink alcoholic beverages -Take any medication unless instructed by your physician -Make any legal decisions or sign important papers.  Meals: Start with liquid foods such as gelatin or soup. Progress to regular foods as tolerated. Avoid greasy, spicy, heavy foods. If nausea and/or vomiting occur, drink only clear liquids until the nausea and/or vomiting subsides. Call your physician if vomiting continues.  Special Instructions/Symptoms: Your throat may feel dry or sore from the anesthesia or the breathing tube placed in your throat during surgery. If this causes discomfort, gargle with warm salt water. The discomfort should disappear within 24 hours.  If you had a scopolamine patch placed behind your ear for the management of post- operative nausea and/or vomiting:  1. The medication in the patch is effective for 72 hours, after which it should be removed.  Wrap patch in a tissue and discard in the trash. Wash hands thoroughly with soap and water. 2. You may remove the patch earlier than 72 hours if you experience unpleasant side effects which may include dry mouth, dizziness or visual disturbances. 3. Avoid touching the patch. Wash your hands with soap and water after contact with the patch.   Cervical Cerclage, Care After This sheet gives you information about how to care for yourself after your procedure. Your health care provider may also give you more specific instructions. If you have problems or questions, contact your health care provider. What can I expect after the procedure? After your procedure, it is common to have:  Cramping in your abdomen.  Mucus discharge for several days.  Painful urination (dysuria).  Small drops of  blood coming from your vagina (spotting). Follow these instructions at home:  Follow instructions from your health care provider about bed rest, if this applies. You may need to be on bed rest for up to 3 days.  Take over-the-counter and prescription medicines only as told by your health care provider.  Do not drive or use heavy machinery while taking prescription pain medicine.  Keep track of your vaginal discharge and watch for any changes. If you notice changes, tell your health care provider.  Avoid physical activities and exercise until your health care provider approves. Ask your health care provider what activities are safe for you.  Until your health care provider approves:  Do not douche.  Do not have sexual intercourse.  Keep all pre-birth (prenatal) visits and all follow-up visits as told by your health care provider. This is important. You will probably have weekly visits to have your cervix checked, and you may need an ultrasound. Contact a health care provider if:  You have abnormal or bad-smelling vaginal discharge, such as clots.  You develop a rash on your skin. This may look like redness and swelling.  You become light-headed or feel like you are going to faint.  You have abdominal pain that does not get better with medicine.  You have persistent nausea or vomiting. Get help right away if:  You have vaginal bleeding that is heavier or more frequent than spotting.  You are leaking fluid or have a gush of fluid from your vagina (your water breaks).  You have a fever or chills.  You faint.  You have uterine contractions. These may feel like:  A back ache.  Lower abdominal pain.  Mild cramps, similar to menstrual cramps.  Tightening or pressure in your abdomen.  You think that your baby is not moving as much as usual, or you cannot feel your baby move.  You have chest pain.  You have shortness of breath. This information is not intended to replace  advice given to you by your health care provider. Make sure you discuss any questions you have with your health care provider. Document Released: 10/18/2012 Document Revised: 08/27/2015 Document Reviewed: 08/01/2015 Elsevier Interactive Patient Education  2017 [employer]  Chlamydia, Female Chlamydia is an STD (sexually transmitted disease). This is an infection that spreads through sexual contact. If it is not treated, it can cause serious problems. It must be treated with antibiotic medicine. Sometimes, you may not have symptoms (asymptomatic). When you have symptoms, they can include:  Burning when you pee (urinate).  Peeing often.  Fluid (discharge) coming from the vagina.  Redness, soreness, and swelling (inflammation) of the butt (rectum).  Bleeding or fluid coming from the butt.  Belly (abdominal) pain.  Pain during sex.  Bleeding between periods.  Itching, burning, or redness in the eyes.  Fluid coming from the eyes. Follow these instructions at home: Medicines   Take over-the-counter and prescription medicines only as told by your doctor.  Take your antibiotic medicine as told by your doctor. Do not stop taking the antibiotic even if you start to feel better. Sexual activity   Tell sex partners about your infection. Sex partners are people you had oral, anal, or vaginal sex with within 60 days of when you started getting sick. They need treatment, too.  Do not have sex until:  You and your sex partners have been treated.  Your doctor says it is okay.  If you have a single dose treatment, wait 7 days before having sex. General instructions   It is up to you to get your test results. Ask your doctor when your results will be ready.  Get a lot of rest.  Eat healthy foods.  Drink enough fluid to keep your pee (urine) clear or pale yellow.  Keep all follow-up visits as told by your doctor. You may need tests after 3 months. Preventing chlamydia   The  only way to prevent chlamydia is not to have sex. To lower your risk:  Use latex condoms correctly. Do this every time you have sex.  Avoid having many sex partners.  Ask if your partner has been tested for STDs and if he or she had negative results. Contact a doctor if:  You get new symptoms.  You do not get better with treatment.  You have a fever or chills.  You have pain during sex. Get help right away if:  Your pain gets worse and does not get better with medicine.  You get flu-like symptoms, such as:  Night sweats.  Sore throat.  Muscle aches.  You feel sick to your stomach (nauseous).  You throw up (vomit).  You have trouble swallowing.  You have bleeding:  Between periods.  After sex.  You have irregular periods.  You have belly pain that does not get better with medicine.  You have lower back pain that does not get better with medicine.  You feel weak or dizzy.  You pass out (faint).  You are pregnant and you get symptoms of chlamydia. Summary  Chlamydia is an infection that spreads through sexual contact.  Sometimes, chlamydia can cause no  symptoms (asymptomatic).  Do not have sex until your doctor says it is okay.  All sex partners will have to be treated for chlamydia. This information is not intended to replace advice given to you by your health care provider. Make sure you discuss any questions you have with your health care provider. Document Released: 10/07/2007 Document Revised: 12/18/2015 Document Reviewed: 12/18/2015 Elsevier Interactive Patient Education  2017 [employer].

## 2016-05-13 NOTE — Transfer of Care (Signed)
Immediate Anesthesia Transfer of Care Note  Patient: Margaret Keller  Procedure(s) Performed: Procedure(s): CERCLAGE CERVICAL (N/A)  Patient Location: PACU  Anesthesia Type:Spinal  Level of Consciousness: awake, alert  and oriented  Airway & Oxygen Therapy: Patient Spontanous Breathing  Post-op Assessment: Report given to RN and Post -op Vital signs reviewed and stable  Post vital signs: Reviewed and stable  Last Vitals:  Vitals:   05/13/16 1234  BP: 133/79  Pulse: 89  Resp: 18  Temp: 36.6 C    Last Pain:  Vitals:   05/13/16 1234  TempSrc: Oral  PainSc: 0-No pain      Patients Stated Pain Goal: 3 (05/13/16 1234)  Complications: No apparent anesthesia complications

## 2016-05-14 ENCOUNTER — Encounter (HOSPITAL_COMMUNITY): Payer: Self-pay | Admitting: Obstetrics and Gynecology

## 2016-05-15 LAB — RPR: RPR Ser Ql: NONREACTIVE

## 2016-05-15 LAB — AFP, QUAD SCREEN
DIA MOM VALUE: 1.58
DIA Value (EIA): 263.94 pg/mL
DSR (BY AGE) 1 IN: 1088
DSR (Second Trimester) 1 IN: 9950
GESTATIONAL AGE AFP: 17.3 wk
MATERNAL AGE AT EDD: 23.5 a
MSAFP Mom: 1.74
MSAFP: 75.1 ng/mL
MSHCG Mom: 1.25
MSHCG: 37811 m[IU]/mL
OSB RISK: 2954
T18 (By Age): 1:4238 {titer}
Test Results:: NEGATIVE
UE3 MOM: 1.27
UE3 VALUE: 1.37 ng/mL
Weight: 148 [lb_av]

## 2016-05-15 LAB — HIV ANTIBODY (ROUTINE TESTING W REFLEX): HIV SCREEN 4TH GENERATION: NONREACTIVE

## 2016-05-15 LAB — RUBELLA SCREEN: Rubella Antibodies, IGG: 1.99 index (ref >0.99)

## 2016-05-15 LAB — HEPATITIS B SURFACE ANTIGEN: HEP B S AG: NEGATIVE

## 2016-05-18 ENCOUNTER — Telehealth: Payer: Self-pay | Admitting: *Deleted

## 2016-05-18 NOTE — Telephone Encounter (Signed)
[facility] stating need for prior auth to be completed for pt's Makena auto injector.  She requested that they be notified when this has been completed.  Pt will be receiving 2 courtesy doses of Maken[facility] inject[facility] which are scheduled for delivery on 5/9 so that therapy can be initiated while pre-auth is completed.

## 2016-05-20 ENCOUNTER — Encounter: Payer: Self-pay | Admitting: Advanced Practice Midwife

## 2016-05-23 ENCOUNTER — Encounter: Payer: Self-pay | Admitting: Family Medicine

## 2016-05-23 DIAGNOSIS — O3430 Maternal care for cervical incompetence, unspecified trimester: Secondary | ICD-10-CM | POA: Insufficient documentation

## 2016-05-23 DIAGNOSIS — O0930 Supervision of pregnancy with insufficient antenatal care, unspecified trimester: Secondary | ICD-10-CM | POA: Insufficient documentation

## 2016-05-24 NOTE — Telephone Encounter (Signed)
New authorization has been faxed to [facility].

## 2016-05-25 NOTE — Progress Notes (Signed)
[facility] to know if a prior authorization has been completed on [facility].  Contacted [facility] and spoke with Clifton CustardAaron, Pharmacist, and requested to have Autoinjector switched to the IM medication.  Change of route implemented and medication will be sent this week.

## 2016-05-26 ENCOUNTER — Other Ambulatory Visit (HOSPITAL_COMMUNITY): Payer: Self-pay | Admitting: Certified Nurse Midwife

## 2016-05-26 ENCOUNTER — Ambulatory Visit (HOSPITAL_COMMUNITY)
Admission: RE | Admit: 2016-05-26 | Discharge: 2016-05-26 | Disposition: A | Discharge status: Home / Self Care | Payer: Medicaid Other | Source: Ambulatory Visit | Attending: Certified Nurse Midwife | Admitting: Certified Nurse Midwife

## 2016-05-26 ENCOUNTER — Encounter (HOSPITAL_COMMUNITY): Payer: Self-pay

## 2016-05-26 DIAGNOSIS — Z3A19 19 weeks gestation of pregnancy: Secondary | ICD-10-CM | POA: Insufficient documentation

## 2016-05-26 DIAGNOSIS — O09299 Supervision of pregnancy with other poor reproductive or obstetric history, unspecified trimester: Secondary | ICD-10-CM

## 2016-05-26 DIAGNOSIS — O3432 Maternal care for cervical incompetence, second trimester: Secondary | ICD-10-CM

## 2016-05-26 DIAGNOSIS — O09892 Supervision of other high risk pregnancies, second trimester: Secondary | ICD-10-CM | POA: Insufficient documentation

## 2016-05-26 DIAGNOSIS — Z363 Encounter for antenatal screening for malformations: Secondary | ICD-10-CM | POA: Diagnosis present

## 2016-05-26 DIAGNOSIS — O3430 Maternal care for cervical incompetence, unspecified trimester: Secondary | ICD-10-CM

## 2016-05-26 DIAGNOSIS — Z3A17 17 weeks gestation of pregnancy: Secondary | ICD-10-CM

## 2016-05-26 DIAGNOSIS — O09292 Supervision of pregnancy with other poor reproductive or obstetric history, second trimester: Secondary | ICD-10-CM | POA: Diagnosis present

## 2016-05-26 NOTE — Addendum Note (Signed)
Encounter addended by: Vivien RotaSmall, Melquiades Kovar H, RT on: 05/26/2016  3:00 PM<BR>    Actions taken: Imaging Exam ended

## 2016-05-26 NOTE — Addendum Note (Signed)
Encounter addended by: Donette LarryBhambri, Amberleigh Gerken, CNM on: 05/26/2016  4:44 PM<BR>    Actions taken: Visit diagnoses modified, Problem List modified

## 2016-06-01 ENCOUNTER — Other Ambulatory Visit (HOSPITAL_COMMUNITY): Payer: Self-pay | Admitting: Certified Nurse Midwife

## 2016-06-01 DIAGNOSIS — O09299 Supervision of pregnancy with other poor reproductive or obstetric history, unspecified trimester: Secondary | ICD-10-CM

## 2016-06-01 DIAGNOSIS — Z3A19 19 weeks gestation of pregnancy: Secondary | ICD-10-CM

## 2016-06-01 DIAGNOSIS — O3432 Maternal care for cervical incompetence, second trimester: Secondary | ICD-10-CM

## 2016-06-01 DIAGNOSIS — Z363 Encounter for antenatal screening for malformations: Secondary | ICD-10-CM

## 2016-06-09 ENCOUNTER — Ambulatory Visit (INDEPENDENT_AMBULATORY_CARE_PROVIDER_SITE_OTHER): Payer: Medicaid Other | Admitting: Clinical

## 2016-06-09 ENCOUNTER — Encounter: Payer: Self-pay | Admitting: *Deleted

## 2016-06-09 ENCOUNTER — Ambulatory Visit (INDEPENDENT_AMBULATORY_CARE_PROVIDER_SITE_OTHER): Payer: Medicaid Other | Admitting: Family Medicine

## 2016-06-09 VITALS — BP 129/69 | HR 95 | Wt 151.5 lb

## 2016-06-09 DIAGNOSIS — O09299 Supervision of pregnancy with other poor reproductive or obstetric history, unspecified trimester: Secondary | ICD-10-CM

## 2016-06-09 DIAGNOSIS — O3430 Maternal care for cervical incompetence, unspecified trimester: Secondary | ICD-10-CM

## 2016-06-09 DIAGNOSIS — F419 Anxiety disorder, unspecified: Secondary | ICD-10-CM | POA: Diagnosis not present

## 2016-06-09 DIAGNOSIS — O3432 Maternal care for cervical incompetence, second trimester: Secondary | ICD-10-CM

## 2016-06-09 DIAGNOSIS — O0992 Supervision of high risk pregnancy, unspecified, second trimester: Secondary | ICD-10-CM | POA: Diagnosis not present

## 2016-06-09 DIAGNOSIS — F4321 Adjustment disorder with depressed mood: Secondary | ICD-10-CM | POA: Diagnosis not present

## 2016-06-09 DIAGNOSIS — O09292 Supervision of pregnancy with other poor reproductive or obstetric history, second trimester: Secondary | ICD-10-CM

## 2016-06-09 DIAGNOSIS — O0990 Supervision of high risk pregnancy, unspecified, unspecified trimester: Secondary | ICD-10-CM

## 2016-06-09 NOTE — BH Specialist Note (Signed)
Integrated Behavioral Health Initial Visit  MRN: [id number] Name: Margaret Keller   Session Start time: 4:00 Session End time: 4:30 Total time: 30 minutes  Type of Service: Integrated Behavioral Health- Individual/Family Interpretor:No. Interpretor Name and Language: n/a   Warm Hand Off Completed.       SUBJECTIVE: Margaret Keller is a 21 y.o. female accompanied by patient and daughter. Patient was referred by Dr Shawnie PonsPratt for anxiety, depression. Patient reports the following symptoms/concerns: Pt states her primary concern is adjusting to being a single mother; primary symptoms are feeling irritable, excessive worry, difficulty falling asleep due to worry.  Duration of problem: Current pregnancy; Severity of problem: moderate  OBJECTIVE: Mood: Anxious and Depressed and Affect: Tearful Risk of harm to self or others: No plan to harm self or others   LIFE CONTEXT: Family and Social: Lives with daughter; separated from husband School/Work: Currently seeking work Self-Care: "No time to myself" Life Changes: Current pregnancy, marital separation  GOALS ADDRESSED: Patient will reduce symptoms of: anxiety, depression and stress and increase knowledge and/or ability of: self-management skills and also: Increase healthy adjustment to current life circumstances   INTERVENTIONS: Mindfulness or Relaxation Training and Psychoeducation and/or Health Education  Standardized Assessments completed: GAD-7 and PHQ 9  ASSESSMENT: Patient currently experiencing Adjustment disorder with mixed anxious and depressed mood. Patient may benefit from psychoeducation and brief therapeutic intervention regarding coping with symptoms of anxiety and depression.  PLAN: 1. Follow up with behavioral health clinician on : One month 2. Behavioral recommendations:  -Consider CALM relaxation breathing exercise and/or calming app daily, at least 2 minutes, when daughter sleeps -Read educational material  regarding coping with symptoms of anxiety and depression -Call [facility] for resource counseling appointment, as needed 3. Referral(s): [facility]  Valetta CloseJamie C [place], [place]  Depression screen [facility] 06/09/2016 01/30/2015 01/02/2015 12/04/2014 11/20/2014  Decreased Interest 1 0 0 0 0  Down, Depressed, Hopeless 1 0 0 0 0  PHQ - 2 Score 2 0 0 0 0  Altered sleeping 3 - - - -  Tired, decreased energy 2 - - - -  Change in appetite 3 - - - -  Feeling bad or failure about yourself  0 - - - -  Trouble concentrating 1 - - - -  Moving slowly or fidgety/restless 0 - - - -  Suicidal thoughts 0 - - - -  PHQ-9 Score 11 - - - -   GAD 7 : Generalized Anxiety Score 06/09/2016  Nervous, Anxious, on Edge 2  Control/stop worrying 3  Worry too much - different things 3  Trouble relaxing 2  Restless 1  Easily annoyed or irritable 3  Afraid - awful might happen 2  Total GAD 7 Score 16

## 2016-06-09 NOTE — Progress Notes (Signed)
    PRENATAL VISIT NOTE  Subjective:  Margaret Keller is a 21 y.o. G4P0301 at 6146w2d being seen today for ongoing prenatal care.  [name], currently pregnant; Cervical insufficiency during pregnancy, antepartum; History of pre-eclampsia in prior pregnancy, currently pregnant; Supervision of high risk pregnancy, antepartum; Cervical cerclage suture present- Placed 05/13/2016; Late prenatal care affecting pregnancy, antepartum; [name]  [name]  Contractions: Not present. Vag. Bleeding: None.  Movement: Present. Denies leaking of fluid.   [name], current medications, past family history, past medical history, past social history, past surgical history and problem list. Problem list updated.  Objective:   Vitals:   06/09/16 1513  BP: 129/69  Pulse: 95  Weight: 151 lb 8 oz (68.7 kg)    Fetal Status: Fetal Heart Rate (bpm): 140   Movement: Present     General:  Alert, oriented and cooperative. Patient is in no acute distress.  Skin: Skin is warm and dry. No rash noted.   Cardiovascular: Normal heart rate noted  Respiratory: Normal respiratory effort, no problems with respiration noted  Abdomen: Soft, gravid, appropriate for gestational age. Pain/Pressure: Present     Pelvic:  Cervical exam deferred        Extremities: Normal range of motion.  Edema: None  Mental Status: Normal mood and affect. Normal behavior. Normal judgment and thought content.   Assessment and Plan:  Pregnancy: G4P0301 at 4146w2d  1. Cervical insufficiency during pregnancy, antepartum Continue cerclage Beginning 17 P today  2. History of pre-eclampsia in prior pregnancy, currently pregnant Continue baby ASA  3. Supervision of high risk pregnancy, antepartum Continue  prenatal care.  4. Anxiety To see Margaret Keller today - Ambulatory referral to Integrated Behavioral Health  General obstetric precautions including but not limited to vaginal bleeding, contractions, leaking of fluid and fetal movement were reviewed in detail with the patient. Please refer to After Visit Summary for other counseling recommendations.  Return in 4 weeks (on 07/07/2016) for 17 P weekly.   Reva Boresanya S Yamilet Mcfayden, MD

## 2016-06-09 NOTE — Progress Notes (Signed)
[facility] Dr Shawnie PonsPratt aware

## 2016-06-09 NOTE — Patient Instructions (Signed)
 Third Trimester of Pregnancy The third trimester is from week 28 through week 40 (months 7 through 9). The third trimester is a time when the unborn baby (fetus) is growing rapidly. At the end of the ninth month, the fetus is about 20 inches in length and weighs 6-10 pounds. Body changes during your third trimester Your body will continue to go through many changes during pregnancy. The changes vary from woman to woman. During the third trimester:  Your weight will continue to increase. You can expect to gain 25-35 pounds (11-16 kg) by the end of the pregnancy.  You may begin to get stretch marks on your hips, abdomen, and breasts.  You may urinate more often because the fetus is moving lower into your pelvis and pressing on your bladder.  You may develop or continue to have heartburn. This is caused by increased hormones that slow down muscles in the digestive tract.  You may develop or continue to have constipation because increased hormones slow digestion and cause the muscles that push waste through your intestines to relax.  You may develop hemorrhoids. These are swollen veins (varicose veins) in the rectum that can itch or be painful.  You may develop swollen, bulging veins (varicose veins) in your legs.  You may have increased body aches in the pelvis, back, or thighs. This is due to weight gain and increased hormones that are relaxing your joints.  You may have changes in your hair. These can include thickening of your hair, rapid growth, and changes in texture. Some women also have hair loss during or after pregnancy, or hair that feels dry or thin. Your hair will most likely return to normal after your baby is born.  Your breasts will continue to grow and they will continue to become tender. A yellow fluid (colostrum) may leak from your breasts. This is the first milk you are producing for your baby.  Your belly button may stick out.  You may notice more swelling in your  hands, face, or ankles.  You may have increased tingling or numbness in your hands, arms, and legs. The skin on your belly may also feel numb.  You may feel short of breath because of your expanding uterus.  You may have more problems sleeping. This can be caused by the size of your belly, increased need to urinate, and an increase in your body's metabolism.  You may notice the fetus "dropping," or moving lower in your abdomen (lightening).  You may have increased vaginal discharge.  You may notice your joints feel loose and you may have pain around your pelvic bone.  What to expect at prenatal visits You will have prenatal exams every 2 weeks until week 36. Then you will have weekly prenatal exams. During a routine prenatal visit:  You will be weighed to make sure you and the baby are growing normally.  Your blood pressure will be taken.  Your abdomen will be measured to track your baby's growth.  The fetal heartbeat will be listened to.  Any test results from the previous visit will be discussed.  You may have a cervical check near your due date to see if your cervix has softened or thinned (effaced).  You will be tested for Group B streptococcus. This happens between 35 and 37 weeks.  Your health care provider may ask you:  What your birth plan is.  How you are feeling.  If you are feeling the baby move.  If you have   had any abnormal symptoms, such as leaking fluid, bleeding, severe headaches, or abdominal cramping.  If you are using any tobacco products, including cigarettes, chewing tobacco, and electronic cigarettes.  If you have any questions.  Other tests or screenings that may be performed during your third trimester include:  Blood tests that check for low iron levels (anemia).  Fetal testing to check the health, activity level, and growth of the fetus. Testing is done if you have certain medical conditions or if there are problems during the  pregnancy.  Nonstress test (NST). This test checks the health of your baby to make sure there are no signs of problems, such as the baby not getting enough oxygen. During this test, a belt is placed around your belly. The baby is made to move, and its heart rate is monitored during movement.  What is false labor? False labor is a condition in which you feel small, irregular tightenings of the muscles in the womb (contractions) that usually go away with rest, changing position, or drinking water. These are called Braxton Hicks contractions. Contractions may last for hours, days, or even weeks before true labor sets in. If contractions come at regular intervals, become more frequent, increase in intensity, or become painful, you should see your health care provider. What are the signs of labor?  Abdominal cramps.  Regular contractions that start at 10 minutes apart and become stronger and more frequent with time.  Contractions that start on the top of the uterus and spread down to the lower abdomen and back.  Increased pelvic pressure and dull back pain.  A watery or bloody mucus discharge that comes from the vagina.  Leaking of amniotic fluid. This is also known as your "water breaking." It could be a slow trickle or a gush. Let your health care provider know if it has a color or strange odor. If you have any of these signs, call your health care provider right away, even if it is before your due date. Follow these instructions at home: Medicines  Follow your health care provider's instructions regarding medicine use. Specific medicines may be either safe or unsafe to take during pregnancy.  Take a prenatal vitamin that contains at least 600 micrograms (mcg) of folic acid.  If you develop constipation, try taking a stool softener if your health care provider approves. Eating and drinking  Eat a balanced diet that includes fresh fruits and vegetables, whole grains, good sources of protein  such as meat, eggs, or tofu, and low-fat dairy. Your health care provider will help you determine the amount of weight gain that is right for you.  Avoid raw meat and uncooked cheese. These carry germs that can cause birth defects in the baby.  If you have low calcium intake from food, talk to your health care provider about whether you should take a daily calcium supplement.  Eat four or five small meals rather than three large meals a day.  Limit foods that are high in fat and processed sugars, such as fried and sweet foods.  To prevent constipation: ? Drink enough fluid to keep your urine clear or pale yellow. ? Eat foods that are high in fiber, such as fresh fruits and vegetables, whole grains, and beans. Activity  Exercise only as directed by your health care provider. Most women can continue their usual exercise routine during pregnancy. Try to exercise for 30 minutes at least 5 days a week. Stop exercising if you experience uterine contractions.  Avoid   heavy lifting.  Do not exercise in extreme heat or humidity, or at high altitudes.  Wear low-heel, comfortable shoes.  Practice good posture.  You may continue to have sex unless your health care provider tells you otherwise. Relieving pain and discomfort  Take frequent breaks and rest with your legs elevated if you have leg cramps or low back pain.  Take warm sitz baths to soothe any pain or discomfort caused by hemorrhoids. Use hemorrhoid cream if your health care provider approves.  Wear a good support bra to prevent discomfort from breast tenderness.  If you develop varicose veins: ? Wear support pantyhose or compression stockings as told by your healthcare provider. ? Elevate your feet for 15 minutes, 3-4 times a day. Prenatal care  Write down your questions. Take them to your prenatal visits.  Keep all your prenatal visits as told by your health care provider. This is important. Safety  Wear your seat belt at  all times when driving.  Make a list of emergency phone numbers, including numbers for family, friends, the hospital, and police and fire departments. General instructions  Avoid cat litter boxes and soil used by cats. These carry germs that can cause birth defects in the baby. If you have a cat, ask someone to clean the litter box for you.  Do not travel far distances unless it is absolutely necessary and only with the approval of your health care provider.  Do not use hot tubs, steam rooms, or saunas.  Do not drink alcohol.  Do not use any products that contain nicotine or tobacco, such as cigarettes and e-cigarettes. If you need help quitting, ask your health care provider.  Do not use any medicinal herbs or unprescribed drugs. These chemicals affect the formation and growth of the baby.  Do not douche or use tampons or scented sanitary pads.  Do not cross your legs for long periods of time.  To prepare for the arrival of your baby: ? Take prenatal classes to understand, practice, and ask questions about labor and delivery. ? Make a trial run to the hospital. ? Visit the hospital and tour the maternity area. ? Arrange for maternity or paternity leave through employers. ? Arrange for family and friends to take care of pets while you are in the hospital. ? Purchase a rear-facing car seat and make sure you know how to install it in your car. ? Pack your hospital bag. ? Prepare the baby's nursery. Make sure to remove all pillows and stuffed animals from the baby's crib to prevent suffocation.  Visit your dentist if you have not gone during your pregnancy. Use a soft toothbrush to brush your teeth and be gentle when you floss. Contact a health care provider if:  You are unsure if you are in labor or if your water has broken.  You become dizzy.  You have mild pelvic cramps, pelvic pressure, or nagging pain in your abdominal area.  You have lower back pain.  You have persistent  nausea, vomiting, or diarrhea.  You have an unusual or bad smelling vaginal discharge.  You have pain when you urinate. Get help right away if:  Your water breaks before 37 weeks.  You have regular contractions less than 5 minutes apart before 37 weeks.  You have a fever.  You are leaking fluid from your vagina.  You have spotting or bleeding from your vagina.  You have severe abdominal pain or cramping.  You have rapid weight loss or weight   gain.  You have shortness of breath with chest pain.  You notice sudden or extreme swelling of your face, hands, ankles, feet, or legs.  Your baby makes fewer than 10 movements in 2 hours.  You have severe headaches that do not go away when you take medicine.  You have vision changes. Summary  The third trimester is from week 28 through week 40, months 7 through 9. The third trimester is a time when the unborn baby (fetus) is growing rapidly.  During the third trimester, your discomfort may increase as you and your baby continue to gain weight. You may have abdominal, leg, and back pain, sleeping problems, and an increased need to urinate.  During the third trimester your breasts will keep growing and they will continue to become tender. A yellow fluid (colostrum) may leak from your breasts. This is the first milk you are producing for your baby.  False labor is a condition in which you feel small, irregular tightenings of the muscles in the womb (contractions) that eventually go away. These are called Braxton Hicks contractions. Contractions may last for hours, days, or even weeks before true labor sets in.  Signs of labor can include: abdominal cramps; regular contractions that start at 10 minutes apart and become stronger and more frequent with time; watery or bloody mucus discharge that comes from the vagina; increased pelvic pressure and dull back pain; and leaking of amniotic fluid. This information is not intended to replace advice  given to you by your health care provider. Make sure you discuss any questions you have with your health care provider. Document Released: 12/22/2000 Document Revised: 06/05/2015 Document Reviewed: 02/29/2012 [employer] Interactive Patient Education  2017 [employer]   Breastfeeding Deciding to breastfeed is one of the best choices you can make for you and your baby. A change in hormones during pregnancy causes your breast tissue to grow and increases the number and size of your milk ducts. These hormones also allow proteins, sugars, and fats from your blood supply to make breast milk in your milk-producing glands. Hormones prevent breast milk from being released before your baby is born as well as prompt milk flow after birth. Once breastfeeding has begun, thoughts of your baby, as well as his or her sucking or crying, can stimulate the release of milk from your milk-producing glands. Benefits of breastfeeding For Your Baby  Your first milk (colostrum) helps your baby's digestive system function better.  There are antibodies in your milk that help your baby fight off infections.  Your baby has a lower incidence of asthma, allergies, and sudden infant death syndrome.  The nutrients in breast milk are better for your baby than infant formulas and are designed uniquely for your baby's needs.  Breast milk improves your baby's brain development.  Your baby is less likely to develop other conditions, such as childhood obesity, asthma, or type 2 diabetes mellitus.  For You  Breastfeeding helps to create a very special bond between you and your baby.  Breastfeeding is convenient. Breast milk is always available at the correct temperature and costs nothing.  Breastfeeding helps to burn calories and helps you lose the weight gained during pregnancy.  Breastfeeding makes your uterus contract to its prepregnancy size faster and slows bleeding (lochia) after you give birth.  Breastfeeding helps  to lower your risk of developing type 2 diabetes mellitus, osteoporosis, and breast or ovarian cancer later in life.  Signs that your baby is hungry Early Signs of Hunger    Increased alertness or activity.  Stretching.  Movement of the head from side to side.  Movement of the head and opening of the mouth when the corner of the mouth or cheek is stroked (rooting).  Increased sucking sounds, smacking lips, cooing, sighing, or squeaking.  Hand-to-mouth movements.  Increased sucking of fingers or hands.  Late Signs of Hunger  Fussing.  Intermittent crying.  Extreme Signs of Hunger Signs of extreme hunger will require calming and consoling before your baby will be able to breastfeed successfully. Do not wait for the following signs of extreme hunger to occur before you initiate breastfeeding:  Restlessness.  A loud, strong cry.  Screaming.  Breastfeeding basics Breastfeeding Initiation  Find a comfortable place to sit or lie down, with your neck and back well supported.  Place a pillow or rolled up blanket under your baby to bring him or her to the level of your breast (if you are seated). Nursing pillows are specially designed to help support your arms and your baby while you breastfeed.  Make sure that your baby's abdomen is facing your abdomen.  Gently massage your breast. With your fingertips, massage from your chest wall toward your nipple in a circular motion. This encourages milk flow. You may need to continue this action during the feeding if your milk flows slowly.  Support your breast with 4 fingers underneath and your thumb above your nipple. Make sure your fingers are well away from your nipple and your baby's mouth.  Stroke your baby's lips gently with your finger or nipple.  When your baby's mouth is open wide enough, quickly bring your baby to your breast, placing your entire nipple and as much of the colored area around your nipple (areola) as possible into  your baby's mouth. ? More areola should be visible above your baby's upper lip than below the lower lip. ? Your baby's tongue should be between his or her lower gum and your breast.  Ensure that your baby's mouth is correctly positioned around your nipple (latched). Your baby's lips should create a seal on your breast and be turned out (everted).  It is common for your baby to suck about 2-3 minutes in order to start the flow of breast milk.  Latching Teaching your baby how to latch on to your breast properly is very important. An improper latch can cause nipple pain and decreased milk supply for you and poor weight gain in your baby. Also, if your baby is not latched onto your nipple properly, he or she may swallow some air during feeding. This can make your baby fussy. Burping your baby when you switch breasts during the feeding can help to get rid of the air. However, teaching your baby to latch on properly is still the best way to prevent fussiness from swallowing air while breastfeeding. Signs that your baby has successfully latched on to your nipple:  Silent tugging or silent sucking, without causing you pain.  Swallowing heard between every 3-4 sucks.  Muscle movement above and in front of his or her ears while sucking.  Signs that your baby has not successfully latched on to nipple:  Sucking sounds or smacking sounds from your baby while breastfeeding.  Nipple pain.  If you think your baby has not latched on correctly, slip your finger into the corner of your baby's mouth to break the suction and place it between your baby's gums. Attempt breastfeeding initiation again. Signs of Successful Breastfeeding Signs from your baby:  A   gradual decrease in the number of sucks or complete cessation of sucking.  Falling asleep.  Relaxation of his or her body.  Retention of a small amount of milk in his or her mouth.  Letting go of your breast by himself or herself.  Signs from  you:  Breasts that have increased in firmness, weight, and size 1-3 hours after feeding.  Breasts that are softer immediately after breastfeeding.  Increased milk volume, as well as a change in milk consistency and color by the fifth day of breastfeeding.  Nipples that are not sore, cracked, or bleeding.  Signs That Your Baby is Getting Enough Milk  Wetting at least 1-2 diapers during the first 24 hours after birth.  Wetting at least 5-6 diapers every 24 hours for the first week after birth. The urine should be clear or pale yellow by 5 days after birth.  Wetting 6-8 diapers every 24 hours as your baby continues to grow and develop.  At least 3 stools in a 24-hour period by age 5 days. The stool should be soft and yellow.  At least 3 stools in a 24-hour period by age 7 days. The stool should be seedy and yellow.  No loss of weight greater than 10% of birth weight during the first 3 days of age.  Average weight gain of 4-7 ounces (113-198 g) per week after age 4 days.  Consistent daily weight gain by age 5 days, without weight loss after the age of 2 weeks.  After a feeding, your baby may spit up a small amount. This is common. Breastfeeding frequency and duration Frequent feeding will help you make more milk and can prevent sore nipples and breast engorgement. Breastfeed when you feel the need to reduce the fullness of your breasts or when your baby shows signs of hunger. This is called "breastfeeding on demand." Avoid introducing a pacifier to your baby while you are working to establish breastfeeding (the first 4-6 weeks after your baby is born). After this time you may choose to use a pacifier. Research has shown that pacifier use during the first year of a baby's life decreases the risk of sudden infant death syndrome (SIDS). Allow your baby to feed on each breast as long as he or she wants. Breastfeed until your baby is finished feeding. When your baby unlatches or falls asleep  while feeding from the first breast, offer the second breast. Because newborns are often sleepy in the first few weeks of life, you may need to awaken your baby to get him or her to feed. Breastfeeding times will vary from baby to baby. However, the following rules can serve as a guide to help you ensure that your baby is properly fed:  Newborns (babies 4 weeks of age or younger) may breastfeed every 1-3 hours.  Newborns should not go longer than 3 hours during the day or 5 hours during the night without breastfeeding.  You should breastfeed your baby a minimum of 8 times in a 24-hour period until you begin to introduce solid foods to your baby at around 6 months of age.  Breast milk pumping Pumping and storing breast milk allows you to ensure that your baby is exclusively fed your breast milk, even at times when you are unable to breastfeed. This is especially important if you are going back to work while you are still breastfeeding or when you are not able to be present during feedings. Your lactation consultant can give you guidelines on how   long it is safe to store breast milk. A breast pump is a machine that allows you to pump milk from your breast into a sterile bottle. The pumped breast milk can then be stored in a refrigerator or freezer. Some breast pumps are operated by hand, while others use electricity. Ask your lactation consultant which type will work best for you. Breast pumps can be purchased, but some hospitals and breastfeeding support groups lease breast pumps on a monthly basis. A lactation consultant can teach you how to hand express breast milk, if you prefer not to use a pump. Caring for your breasts while you breastfeed Nipples can become dry, cracked, and sore while breastfeeding. The following recommendations can help keep your breasts moisturized and healthy:  Avoid using soap on your nipples.  Wear a supportive bra. Although not required, special nursing bras and tank  tops are designed to allow access to your breasts for breastfeeding without taking off your entire bra or top. Avoid wearing underwire-style bras or extremely tight bras.  Air dry your nipples for 3-4minutes after each feeding.  Use only cotton bra pads to absorb leaked breast milk. Leaking of breast milk between feedings is normal.  Use lanolin on your nipples after breastfeeding. Lanolin helps to maintain your skin's normal moisture barrier. If you use pure lanolin, you do not need to wash it off before feeding your baby again. Pure lanolin is not toxic to your baby. You may also hand express a few drops of breast milk and gently massage that milk into your nipples and allow the milk to air dry.  In the first few weeks after giving birth, some women experience extremely full breasts (engorgement). Engorgement can make your breasts feel heavy, warm, and tender to the touch. Engorgement peaks within 3-5 days after you give birth. The following recommendations can help ease engorgement:  Completely empty your breasts while breastfeeding or pumping. You may want to start by applying warm, moist heat (in the shower or with warm water-soaked hand towels) just before feeding or pumping. This increases circulation and helps the milk flow. If your baby does not completely empty your breasts while breastfeeding, pump any extra milk after he or she is finished.  Wear a snug bra (nursing or regular) or tank top for 1-2 days to signal your body to slightly decrease milk production.  Apply ice packs to your breasts, unless this is too uncomfortable for you.  Make sure that your baby is latched on and positioned properly while breastfeeding.  If engorgement persists after 48 hours of following these recommendations, contact your health care provider or a lactation consultant. Overall health care recommendations while breastfeeding  Eat healthy foods. Alternate between meals and snacks, eating 3 of each per  day. Because what you eat affects your breast milk, some of the foods may make your baby more irritable than usual. Avoid eating these foods if you are sure that they are negatively affecting your baby.  Drink milk, fruit juice, and water to satisfy your thirst (about 10 glasses a day).  Rest often, relax, and continue to take your prenatal vitamins to prevent fatigue, stress, and anemia.  Continue breast self-awareness checks.  Avoid chewing and smoking tobacco. Chemicals from cigarettes that pass into breast milk and exposure to secondhand smoke may harm your baby.  Avoid alcohol and drug use, including marijuana. Some medicines that may be harmful to your baby can pass through breast milk. It is important to ask your health care   provider before taking any medicine, including all over-the-counter and prescription medicine as well as vitamin and herbal supplements. It is possible to become pregnant while breastfeeding. If birth control is desired, ask your health care provider about options that will be safe for your baby. Contact a health care provider if:  You feel like you want to stop breastfeeding or have become frustrated with breastfeeding.  You have painful breasts or nipples.  Your nipples are cracked or bleeding.  Your breasts are red, tender, or warm.  You have a swollen area on either breast.  You have a fever or chills.  You have nausea or vomiting.  You have drainage other than breast milk from your nipples.  Your breasts do not become full before feedings by the fifth day after you give birth.  You feel sad and depressed.  Your baby is too sleepy to eat well.  Your baby is having trouble sleeping.  Your baby is wetting less than 3 diapers in a 24-hour period.  Your baby has less than 3 stools in a 24-hour period.  Your baby's skin or the white part of his or her eyes becomes yellow.  Your baby is not gaining weight by 5 days of age. Get help right away  if:  Your baby is overly tired (lethargic) and does not want to wake up and feed.  Your baby develops an unexplained fever. This information is not intended to replace advice given to you by your health care provider. Make sure you discuss any questions you have with your health care provider. Document Released: 12/28/2004 Document Revised: 06/11/2015 Document Reviewed: 06/21/2012 Elsevier Interactive Patient Education  2017 Elsevier Inc.  

## 2016-06-16 ENCOUNTER — Ambulatory Visit: Payer: Medicaid Other

## 2016-06-23 ENCOUNTER — Ambulatory Visit: Payer: Medicaid Other

## 2016-06-30 ENCOUNTER — Ambulatory Visit: Payer: Medicaid Other

## 2016-07-05 ENCOUNTER — Encounter: Payer: Medicaid Other | Admitting: Family Medicine

## 2016-07-05 ENCOUNTER — Telehealth: Payer: Self-pay | Admitting: General Practice

## 2016-07-05 ENCOUNTER — Encounter: Payer: Self-pay | Admitting: Family Medicine

## 2016-07-05 ENCOUNTER — Telehealth: Payer: Self-pay

## 2016-07-05 NOTE — Telephone Encounter (Signed)
Patient was a No Show today.  Attempted to call patient to reschedule appointment;left message with new appt and asked patient to give our office a call if she is unable to keep appt. Letter has been mailed as well.

## 2016-07-05 NOTE — Progress Notes (Signed)
Patient did not keep appointment today in [facility]--She will be contacted to re-schedule.

## 2016-07-05 NOTE — Telephone Encounter (Signed)
LM for pt to return call in regards to missed appt. And the need to reschedule for 17p and OB appt.  Front office will also contact pt in regards to rescheduling as well.

## 2016-07-11 ENCOUNTER — Inpatient Hospital Stay (HOSPITAL_COMMUNITY)
Admission: AD | Admit: 2016-07-11 | Discharge: 2016-07-11 | Disposition: A | Discharge status: Home / Self Care | Payer: Medicaid Other | Source: Ambulatory Visit | Attending: Obstetrics & Gynecology | Admitting: Obstetrics & Gynecology

## 2016-07-11 ENCOUNTER — Encounter (HOSPITAL_COMMUNITY): Payer: Self-pay

## 2016-07-11 DIAGNOSIS — Z8249 Family history of ischemic heart disease and other diseases of the circulatory system: Secondary | ICD-10-CM | POA: Insufficient documentation

## 2016-07-11 DIAGNOSIS — Z7982 Long term (current) use of aspirin: Secondary | ICD-10-CM | POA: Diagnosis not present

## 2016-07-11 DIAGNOSIS — Z8759 Personal history of other complications of pregnancy, childbirth and the puerperium: Secondary | ICD-10-CM | POA: Insufficient documentation

## 2016-07-11 DIAGNOSIS — Z9889 Other specified postprocedural states: Secondary | ICD-10-CM | POA: Insufficient documentation

## 2016-07-11 DIAGNOSIS — O162 Unspecified maternal hypertension, second trimester: Secondary | ICD-10-CM

## 2016-07-11 DIAGNOSIS — R51 Headache: Secondary | ICD-10-CM

## 2016-07-11 DIAGNOSIS — O09299 Supervision of pregnancy with other poor reproductive or obstetric history, unspecified trimester: Secondary | ICD-10-CM | POA: Diagnosis not present

## 2016-07-11 DIAGNOSIS — H538 Other visual disturbances: Secondary | ICD-10-CM | POA: Insufficient documentation

## 2016-07-11 DIAGNOSIS — Z833 Family history of diabetes mellitus: Secondary | ICD-10-CM | POA: Diagnosis not present

## 2016-07-11 DIAGNOSIS — Z3A25 25 weeks gestation of pregnancy: Secondary | ICD-10-CM | POA: Insufficient documentation

## 2016-07-11 DIAGNOSIS — O26892 Other specified pregnancy related conditions, second trimester: Secondary | ICD-10-CM | POA: Diagnosis not present

## 2016-07-11 DIAGNOSIS — R42 Dizziness and giddiness: Secondary | ICD-10-CM | POA: Diagnosis present

## 2016-07-11 DIAGNOSIS — R519 Other specified pregnancy related conditions, second trimester: Secondary | ICD-10-CM

## 2016-07-11 LAB — CBC
HCT: 36.3 % (ref 36.0–46.0)
HEMOGLOBIN: 12.4 g/dL (ref 12.0–15.0)
MCH: 29.1 pg (ref 26.0–34.0)
MCHC: 34.2 g/dL (ref 30.0–36.0)
MCV: 85.2 fL (ref 78.0–100.0)
Platelets: 130 10*3/uL — ABNORMAL LOW (ref 150–400)
RBC: 4.26 MIL/uL (ref 3.87–5.11)
RDW: 14.6 % (ref 11.5–15.5)
WBC: 10.8 10*3/uL — ABNORMAL HIGH (ref 4.0–10.5)

## 2016-07-11 LAB — COMPREHENSIVE METABOLIC PANEL
ALK PHOS: 69 U/L (ref 38–126)
ALT: 13 U/L — AB (ref 14–54)
AST: 20 U/L (ref 15–41)
Albumin: 3.2 g/dL — ABNORMAL LOW (ref 3.5–5.0)
Anion gap: 8 (ref 5–15)
CO2: 20 mmol/L — ABNORMAL LOW (ref 22–32)
CREATININE: 0.58 mg/dL (ref 0.44–1.00)
Calcium: 8.3 mg/dL — ABNORMAL LOW (ref 8.9–10.3)
Chloride: 107 mmol/L (ref 101–111)
GFR calc non Af Amer: >60 mL/min (ref >60)
Glucose, Bld: 79 mg/dL (ref 65–99)
Potassium: 3.7 mmol/L (ref 3.5–5.1)
Sodium: 135 mmol/L (ref 135–145)
Total Bilirubin: 0.5 mg/dL (ref 0.3–1.2)
Total Protein: 6.8 g/dL (ref 6.5–8.1)

## 2016-07-11 LAB — URINALYSIS, ROUTINE W REFLEX MICROSCOPIC
Bilirubin Urine: NEGATIVE
Glucose, UA: NEGATIVE mg/dL
Hgb urine dipstick: NEGATIVE
Ketones, ur: 5 mg/dL — AB
Nitrite: NEGATIVE
PH: 7 (ref 5.0–8.0)
Protein, ur: NEGATIVE mg/dL
SPECIFIC GRAVITY, URINE: 1.014 (ref 1.005–1.030)

## 2016-07-11 LAB — PROTEIN / CREATININE RATIO, URINE
CREATININE, URINE: 207 mg/dL
Protein Creatinine Ratio: 0.13 mg/mg{Cre} (ref 0.00–0.15)
TOTAL PROTEIN, URINE: 27 mg/dL

## 2016-07-11 MED ORDER — ASPIRIN-ACETAMINOPHEN-CAFFEINE 250-250-65 MG PO TABS: 1.0000 | ORAL_TABLET | Freq: Two times a day (BID) | ORAL | 0 refills | Status: DC | PRN

## 2016-07-11 MED ORDER — OXYCODONE-ACETAMINOPHEN 5-325 MG PO TABS: 1.0000 | ORAL_TABLET | Freq: Four times a day (QID) | ORAL | 0 refills | Status: DC | PRN

## 2016-07-11 MED ORDER — DEXAMETHASONE SODIUM PHOSPHATE 10 MG/ML IJ SOLN
10.0000 mg | Freq: Once | INTRAMUSCULAR | Status: AC
  Administered 2016-07-11: 10 mg via INTRAVENOUS
  Filled 2016-07-11: qty 1

## 2016-07-11 MED ORDER — OXYCODONE-ACETAMINOPHEN 5-325 MG PO TABS
2.0000 | ORAL_TABLET | Freq: Once | ORAL | Status: AC
  Administered 2016-07-11: 2 via ORAL
  Filled 2016-07-11: qty 2

## 2016-07-11 MED ORDER — LACTATED RINGERS IV BOLUS (SEPSIS)
1000.0000 mL | Freq: Once | INTRAVENOUS | Status: AC
  Administered 2016-07-11: 1000 mL via INTRAVENOUS

## 2016-07-11 MED ORDER — PROCHLORPERAZINE EDISYLATE 5 MG/ML IJ SOLN
10.0000 mg | Freq: Once | INTRAMUSCULAR | Status: AC
  Administered 2016-07-11: 10 mg via INTRAVENOUS
  Filled 2016-07-11: qty 2

## 2016-07-11 MED ORDER — DIPHENHYDRAMINE HCL 50 MG/ML IJ SOLN
25.0000 mg | Freq: Once | INTRAMUSCULAR | Status: AC
  Administered 2016-07-11: 25 mg via INTRAVENOUS
  Filled 2016-07-11: qty 1

## 2016-07-11 NOTE — Discharge Instructions (Signed)
Hypertension During Pregnancy °Hypertension, commonly called high blood pressure, is when the force of blood pumping through your arteries is too strong. Arteries are blood vessels that carry blood from the heart throughout the body. Hypertension during pregnancy can cause problems for you and your baby. Your baby may be born early (prematurely) or may not weigh as much as he or she should at birth. Very bad cases of hypertension during pregnancy can be life-threatening. °Different types of hypertension can occur during pregnancy. These include: °· Chronic hypertension. This happens when: °? You have hypertension before pregnancy and it continues during pregnancy. °? You develop hypertension before you are [redacted] weeks pregnant, and it continues during pregnancy. °· Gestational hypertension. This is hypertension that develops after the 20th week of pregnancy. °· Preeclampsia, also called toxemia of pregnancy. This is a very serious type of hypertension that develops only during pregnancy. It affects the whole body, and it can be very dangerous for you and your baby. ° °Gestational hypertension and preeclampsia usually go away within 6 weeks after your baby is born. Women who have hypertension during pregnancy have a greater chance of developing hypertension later in life or during future pregnancies. °What are the causes? °The exact cause of hypertension is not known. °What increases the risk? °There are certain factors that make it more likely for you to develop hypertension during pregnancy. These include: °· Having hypertension during a previous pregnancy or prior to pregnancy. °· Being overweight. °· Being older than age 40. °· Being pregnant for the first time or being pregnant with more than one baby. °· Becoming pregnant using fertilization methods such as IVF (in vitro fertilization). °· Having diabetes, kidney problems, or systemic lupus erythematosus. °· Having a family history of hypertension. ° °What are the  signs or symptoms? °Chronic hypertension and gestational hypertension rarely cause symptoms. Preeclampsia causes symptoms, which may include: °· Increased protein in your urine. Your health care provider will check for this at every visit before you give birth (prenatal visit). °· Severe headaches. °· Sudden weight gain. °· Swelling of the hands, face, legs, and feet. °· Nausea and vomiting. °· Vision problems, such as blurred or double vision. °· Numbness in the face, arms, legs, and feet. °· Dizziness. °· Slurred speech. °· Sensitivity to bright lights. °· Abdominal pain. °· Convulsions. ° °How is this diagnosed? °You may be diagnosed with hypertension during a routine prenatal exam. At each prenatal visit, you may: °· Have a urine test to check for high amounts of protein in your urine. °· Have your blood pressure checked. A blood pressure reading is recorded as two numbers, such as "120 over 80" (or 120/80). The first ("top") number is called the systolic pressure. It is a measure of the pressure in your arteries when your heart beats. The second ("bottom") number is called the diastolic pressure. It is a measure of the pressure in your arteries as your heart relaxes between beats. Blood pressure is measured in a unit called mm Hg. A normal blood pressure reading is: °? Systolic: below 120. °? Diastolic: below 80. ° °The type of hypertension that you are diagnosed with depends on your test results and when your symptoms developed. °· Chronic hypertension is usually diagnosed before 20 weeks of pregnancy. °· Gestational hypertension is usually diagnosed after 20 weeks of pregnancy. °· Hypertension with high amounts of protein in the urine is diagnosed as preeclampsia. °· Blood pressure measurements that stay above 160 systolic, or above 110 diastolic, are   signs of severe preeclampsia.  How is this treated? Treatment for hypertension during pregnancy varies depending on the type of hypertension you have and how  serious it is.  If you take medicines called ACE inhibitors to treat chronic hypertension, you may need to switch medicines. ACE inhibitors should not be taken during pregnancy.  If you have gestational hypertension, you may need to take blood pressure medicine.  If you are at risk for preeclampsia, your health care provider may recommend that you take a low-dose aspirin every day to prevent high blood pressure during your pregnancy.  If you have severe preeclampsia, you may need to be hospitalized so you and your baby can be monitored closely. You may also need to take medicine (magnesium sulfate) to prevent seizures and to lower blood pressure. This medicine may be given as an injection or through an IV tube.  In some cases, if your condition gets worse, you may need to deliver your baby early.  Follow these instructions at home: Eating and drinking  Drink enough fluid to keep your urine clear or pale yellow.  Eat a healthy diet that is low in salt (sodium). Do not add salt to your food. Check food labels to see how much sodium a food or beverage contains. Lifestyle  Do not use any products that contain nicotine or tobacco, such as cigarettes and e-cigarettes. If you need help quitting, ask your health care provider.  Do not use alcohol.  Avoid caffeine.  Avoid stress as much as possible. Rest and get plenty of sleep. General instructions  Take over-the-counter and prescription medicines only as told by your health care provider.  While lying down, lie on your left side. This keeps pressure off your baby.  While sitting or lying down, raise (elevate) your feet. Try putting some pillows under your lower legs.  Exercise regularly. Ask your health care provider what kinds of exercise are best for you.  Keep all prenatal and follow-up visits as told by your health care provider. This is important. Contact a health care provider if:  You have symptoms that your health care  provider told you may require more treatment or monitoring, such as: ? Fever. ? Vomiting. ? Headache. Get help right away if:  You have severe abdominal pain or vomiting that does not get better with treatment.  You suddenly develop swelling in your hands, ankles, or face.  You gain 4 lbs (1.8 kg) or more in 1 week.  You develop vaginal bleeding, or you have blood in your urine.  You do not feel your baby moving as much as usual.  You have blurred or double vision.  You have muscle twitching or sudden tightening (spasms).  You have shortness of breath.  Your lips or fingernails turn blue. This information is not intended to replace advice given to you by your health care provider. Make sure you discuss any questions you have with your health care provider. Document Released: 09/15/2010 Document Revised: 07/18/2015 Document Reviewed: 06/13/2015 Elsevier Interactive Patient Education  2018 Elsevier Inc.   General Headache Without Cause A headache is pain or discomfort felt around the head or neck area. The specific cause of a headache may not be found. There are many causes and types of headaches. A few common ones are:  Tension headaches.  Migraine headaches.  Cluster headaches.  Chronic daily headaches.  Follow these instructions at home: Watch your condition for any changes. Take these steps to help with your condition: Managing pain  Take over-the-counter and prescription medicines only as told by your health care provider.  Lie down in a dark, quiet room when you have a headache.  If directed, apply ice to the head and neck area: ? Put ice in a plastic bag. ? Place a towel between your skin and the bag. ? Leave the ice on for 20 minutes, 2-3 times per day.  Use a heating pad or hot shower to apply heat to the head and neck area as told by your health care provider.  Keep lights dim if bright lights bother you or make your headaches worse. Eating and  drinking  Eat meals on a regular schedule.  Limit alcohol use.  Decrease the amount of caffeine you drink, or stop drinking caffeine. General instructions  Keep all follow-up visits as told by your health care provider. This is important.  Keep a headache journal to help find out what may trigger your headaches. For example, write down: ? What you eat and drink. ? How much sleep you get. ? Any change to your diet or medicines.  Try massage or other relaxation techniques.  Limit stress.  Sit up straight, and do not tense your muscles.  Do not use tobacco products, including cigarettes, chewing tobacco, or e-cigarettes. If you need help quitting, ask your health care provider.  Exercise regularly as told by your health care provider.  Sleep on a regular schedule. Get 7-9 hours of sleep, or the amount recommended by your health care provider. Contact a health care provider if:  Your symptoms are not helped by medicine.  You have a headache that is different from the usual headache.  You have nausea or you vomit.  You have a fever. Get help right away if:  Your headache becomes severe.  You have repeated vomiting.  You have a stiff neck.  You have a loss of vision.  You have problems with speech.  You have pain in the eye or ear.  You have muscular weakness or loss of muscle control.  You lose your balance or have trouble walking.  You feel faint or pass out.  You have confusion. This information is not intended to replace advice given to you by your health care provider. Make sure you discuss any questions you have with your health care provider. Document Released: 12/28/2004 Document Revised: 06/05/2015 Document Reviewed: 04/22/2014 Elsevier Interactive Patient Education  2017 [employer].

## 2016-07-11 NOTE — MAU Provider Note (Signed)
Chief Complaint:  Headache; Dizziness; and Blurred Vision   First Provider Initiated Contact with Patient 07/11/16 1208     HPI: Margaret Keller is a 21 y.o. [id number] at [redacted]w[redacted]d who presents to maternity admissions reporting Left temporal HA since yesterday. Hx Pre-E in previous pregnancy.  g4P3L1 @ 25.[redacted] wksga. Presents to triage for ha and blurred vision. Denies other symptoms of PIH. Ha started yesterday afternoon. States was NOT as bad as today. Took 2 xtra strength tylenol 08 but did not help like it did in the past. Denies LOF or bleeding. +FM. EFM applied.   Hx incompetant cervix and has cerclage with last and current pregnancy. Hx of PreE and induced [redacted] wksga.  Location: Left temple  Quality: throbbing Severity: 8/10 in pain scale Duration: 24 hours Context: [redacted] weeks gestation Timing: Constant Modifying factors: Resolved w/ Tylenol yesterday, but not today.  Associated signs and symptoms: Pos for blurry vision, sensitivity to light. Neg fore fever, chills, N/V, epigastric pain, pedal edema, neck pain or stiffness, weakness, difficulties w/ speech or gait. No sudden onset.   Denies contractions, leakage of fluid or vaginal bleeding. Good fetal movement. Has had HA's in the past, but this is worse.   Pregnancy Course: Cerclage in place for Hx incompetent cervix.   Past Medical History:  Diagnosis Date  . Cervical insufficiency during pregnancy, antepartum   . History of cervical cerclage   . Preterm labor   . SVD (spontaneous vaginal delivery) 03/26/2015   OB History  Gravida Para Term Preterm AB Living  4 3   3   1   SAB TAB Ectopic Multiple Live Births        0 2    # Outcome Date GA Lbr Len/2nd Weight Sex Delivery Anes PTL Lv  4 Current           3 Preterm 03/26/15 [redacted]w[redacted]d  6 lb 7.2 oz (2.925 kg) F Vag-Spont EPI  LIV  2 Preterm 05/31/14 [redacted]w[redacted]d 08:50 / 01:42 1 lb 2.4 oz (0.522 kg) M Vag-Spont None  ND     Birth Comments: 22 weeks. Eyes fused.  1 Preterm 10/23/13 [redacted]w[redacted]d 04:31  / 00:02 12.2 oz (0.346 kg) M Vag-Spont None  FD     Past Surgical History:  Procedure Laterality Date  . CERVICAL CERCLAGE N/A 11/19/2014   Procedure: CERCLAGE CERVICALw/spinal;  Surgeon: Tereso Newcomer, MD;  Location: [facility];  Service: Gynecology;  Laterality: N/A;  . CERVICAL CERCLAGE N/A 05/13/2016   Procedure: CERCLAGE CERVICAL;  Surgeon: Tilda Burrow, MD;  Location: [facility];  Service: Gynecology;  Laterality: N/A;   Family History  Problem Relation Age of Onset  . Hypertension Mother   . Diabetes Father    Social History  Substance Use Topics  . Smoking status: Never Smoker  . Smokeless tobacco: Never Used  . Alcohol use No   No Known Allergies Facility-Administered Medications Prior to Admission  Medication Dose Route Frequency Provider Last Rate Last Dose  . hydroxyprogesterone caproate (MAKENA) 250 mg/mL injection 250 mg  250 mg Intramuscular Weekly Reva Bores, MD   250 mg at 06/09/16 1525   Prescriptions Prior to Admission  Medication Sig Dispense Refill Last Dose  . aspirin EC 81 MG tablet Take 81 mg by mouth daily.   07/10/2016 at 1000  . Prenatal Vit-Fe Fumarate-FA (PRENATAL MULTIVITAMIN) TABS tablet Take 1 tablet by mouth daily.    07/10/2016 at Unknown time    I have reviewed patient's Past Medical Hx,  Surgical Hx, Family Hx, Social Hx, medications and allergies.   ROS:  Review of Systems  Constitutional: Negative for chills and fever.  HENT: Negative for congestion and sinus pressure.   Eyes: Positive for photophobia and visual disturbance.  Cardiovascular: Negative for leg swelling.  Gastrointestinal: Negative for abdominal pain, nausea and vomiting.  Genitourinary: Negative for vaginal bleeding.  Musculoskeletal: Negative for gait problem, neck pain and neck stiffness.  Neurological: Positive for headaches. Negative for dizziness, seizures, syncope, facial asymmetry, speech difficulty, weakness and numbness.  Psychiatric/Behavioral: Negative for  confusion.    Physical Exam   Patient Vitals for the past 24 hrs:  BP Temp Pulse Resp Height Weight  07/11/16 1330 115/71 - (!) 104 - - -  07/11/16 1315 130/74 - 98 - - -  07/11/16 1300 132/69 - (!) 102 - - -  07/11/16 1245 127/71 - (!) 107 - - -  07/11/16 1230 (!) 145/88 - (!) 108 - - -  07/11/16 1215 (!) 144/83 - (!) 106 - - -  07/11/16 1200 122/72 - (!) 108 - - -  07/11/16 1145 (!) 141/91 - (!) 108 - - -  07/11/16 1130 (!) 154/79 98.5 F (36.9 C) (!) 115 16 5\' 4"  (1.626 m) 152 lb (68.9 kg)   Patient Vitals for the past 24 hrs:  BP Pulse  07/11/16 1730 118/62 85  07/11/16 1715 128/63 91  07/11/16 1700 (!) 122/59 88  07/11/16 1645 (!) 152/84 (!) 126  07/11/16 1630 (!) 151/83 (!) 112  07/11/16 1605 (!) 172/99 (!) 125  07/11/16 1600 (!) 156/91 (!) 114  07/11/16 1545 (!) 147/78 95     Constitutional: Well-developed, well-nourished female in mild distress. Prefers to have light off. Cardiovascular: Mild tachycardia Respiratory: normal effort GI: Abd soft, non-tender, gravid appropriate for gestational age.  MS: Extremities nontender, no edema, normal ROM Neurologic: Alert and oriented x 4. Nml speech and gait. Symmetric facial mvmts.  GU: Deferred FHT:  Baseline 135 , moderate variability, accelerations present, no decelerations Contractions: None   Labs: Results for orders placed or performed during the hospital encounter of 07/11/16 (from the past 24 hour(s))  Urinalysis, Routine w reflex microscopic     Status: Abnormal   Collection Time: 07/11/16 11:30 AM  Result Value Ref Range   Color, Urine YELLOW YELLOW   APPearance CLEAR CLEAR   Specific Gravity, Urine 1.014 1.005 - 1.030   pH 7.0 5.0 - 8.0   Glucose, UA NEGATIVE NEGATIVE mg/dL   Hgb urine dipstick NEGATIVE NEGATIVE   Bilirubin Urine NEGATIVE NEGATIVE   Ketones, ur 5 (A) NEGATIVE mg/dL   Protein, ur NEGATIVE NEGATIVE mg/dL   Nitrite NEGATIVE NEGATIVE   Leukocytes, UA TRACE (A) NEGATIVE   RBC / HPF 0-5 0  - 5 RBC/hpf   WBC, UA 0-5 0 - 5 WBC/hpf   Bacteria, UA RARE (A) NONE SEEN   Squamous Epithelial / LPF 0-5 (A) NONE SEEN   Mucous PRESENT   Protein / creatinine ratio, urine     Status: None   Collection Time: 07/11/16 11:30 AM  Result Value Ref Range   Creatinine, Urine 207.00 mg/dL   Total Protein, Urine 27 mg/dL   Protein Creatinine Ratio 0.13 0.00 - 0.15 mg/mg[Cre]  CBC     Status: Abnormal   Collection Time: 07/11/16 12:13 PM  Result Value Ref Range   WBC 10.8 (H) 4.0 - 10.5 K/uL   RBC 4.26 3.87 - 5.11 MIL/uL   Hemoglobin 12.4 12.0 - 15.0  g/dL   HCT 53.6 64.4 - 03.4 %   MCV 85.2 78.0 - 100.0 fL   MCH 29.1 26.0 - 34.0 pg   MCHC 34.2 30.0 - 36.0 g/dL   RDW 74.2 59.5 - 63.8 %   Platelets 130 (L) 150 - 400 K/uL  Comprehensive metabolic panel     Status: Abnormal   Collection Time: 07/11/16 12:13 PM  Result Value Ref Range   Sodium 135 135 - 145 mmol/L   Potassium 3.7 3.5 - 5.1 mmol/L   Chloride 107 101 - 111 mmol/L   CO2 20 (L) 22 - 32 mmol/L   Glucose, Bld 79 65 - 99 mg/dL   BUN <5 (L) 6 - 20 mg/dL   Creatinine, Ser 7.56 0.44 - 1.00 mg/dL   Calcium 8.3 (L) 8.9 - 10.3 mg/dL   Total Protein 6.8 6.5 - 8.1 g/dL   Albumin 3.2 (L) 3.5 - 5.0 g/dL   AST 20 15 - 41 U/L   ALT 13 (L) 14 - 54 U/L   Alkaline Phosphatase 69 38 - 126 U/L   Total Bilirubin 0.5 0.3 - 1.2 mg/dL   GFR calc non Af Amer >60 >60 mL/min   GFR calc Af Amer >60 >60 mL/min   Anion gap 8 5 - 15    Imaging:  No results found.  MAU Course: Orders Placed This Encounter  Procedures  . Urinalysis, Routine w reflex microscopic  . CBC  . Comprehensive metabolic panel  . Protein / creatinine ratio, urine  . Vital signs  . Encourage fluids  . Discharge patient   Meds ordered this encounter  Medications  . oxyCODONE-acetaminophen (PERCOCET/ROXICET) 5-325 MG per tablet 2 tablet  . aspirin EC 81 MG tablet    Sig: Take 81 mg by mouth daily.  Marland Kitchen aspirin-acetaminophen-caffeine (EXCEDRIN MIGRAINE) 250-250-65 MG  tablet    Sig: Take 1 tablet by mouth every 12 (twelve) hours as needed for headache.    Dispense:  10 tablet    Refill:  0    Order Specific Question:   Supervising Provider    Answer:   Willodean Rosenthal [id number]  . oxyCODONE-acetaminophen (PERCOCET/ROXICET) 5-325 MG tablet    Sig: Take 1-2 tablets by mouth every 6 (six) hours as needed.    Dispense:  10 tablet    Refill:  0    Order Specific Question:   Supervising Provider    Answer:   Willodean Rosenthal [[id number]]   Feeling much better after Percocet. HA now 4/10. Discussed Hx, BP, labs (Plts), exam w/ Dr. Erin Fulling. Doubts Pre-E HA. Ptls run 130-160K-->130K today. Will need to be followed-up closely, but does not require hospitalization today. Pt also voicing preference to not be admitted. Dr. Erin Fulling recommends Excedrin Migraine x 48 hours.    Pt HA returned when she stood up and light was on when she was to be discharged. Consulted w/. Dr. Erin Fulling. Still doubt pre-E HA. Ordered HA cocktail.   HA resolved. Pt states she feels better.   MDM: - GHTN vs Pre-E. Appropriate for D/C w/ resolution of HA per consult w/ Dr. Derwood Kaplan.  - Possible Migraine HA due to sensitivity to light, unilateral location.  Assessment: 1. Hypertension affecting pregnancy in second trimester   2. Headache in pregnancy, antepartum, second trimester   3. History of pre-eclampsia in prior pregnancy, currently pregnant     Plan: Discharge home in stable condition per consult w/ D.r Harraway-Tyauna Lacaze.  preterm Labor precautions and fetal kick counts Pre-E and general HA  precautions.  BP check in 48 hours. Excedrin Migraine x 48 hours PRN. Return to MAU if this is not helping. Push fluids and rest.  Follow-up Information    [facility] Follow up on 07/13/2016.   Specialty:  Obstetrics and Gynecology Why:  for prenatal visit and blood pressure check Contact information: [address] [place]  [place] [address] [phone]       [facility] [place] MATERNITY ADMISSIONS Follow up.   Why:  as needed if symptoms worsen Contact information: [address] [id number] mc [place] [address] [phone]          Allergies as of 07/11/2016   No Known Allergies     Medication List    TAKE these medications   aspirin EC 81 MG tablet Take 81 mg by mouth daily.   aspirin-acetaminophen-caffeine 250-250-65 MG tablet Commonly known as:  EXCEDRIN MIGRAINE Take 1 tablet by mouth every 12 (twelve) hours as needed for headache.   oxyCODONE-acetaminophen 5-325 MG tablet Commonly known as:  PERCOCET/ROXICET Take 1-2 tablets by mouth every 6 (six) hours as needed.   prenatal multivitamin Tabs tablet Take 1 tablet by mouth daily.       Margaret Keller, [place], [place] 07/11/2016 3:33 PM

## 2016-07-11 NOTE — MAU Note (Signed)
Pt presents to MAU with complaints of severe headache, blurred vision and dizziness for a couple of days. Denies any VB or abnormal discharge. Pre-E with last pregnancy per pt at 36 weeks

## 2016-07-11 NOTE — Progress Notes (Addendum)
g4P3L1 @ 25.[redacted] wksga. Presents to triage for ha and blurred vision. Denies other symptoms of PIH. Ha started yesterday afternoon. States was NOT as bad as today. Took 2 xtra strength tylenol 08 but did not help like it did in the past. Denies LOF or bleeding. +FM. EFM applied.   Hx incompetant cervix and has cerclage with last and current pregnancy. Hx of PreE and induced [redacted] wksga.  1212: monitors off. Two cups of water given. Lab at bs.   1542: Discharge orders received. pt up to bathroom. States ha is 9/10 when standing up. Back from using bathroom and recycled bp.   D/c home delayed due to 9/10 ha. New orders received for IV infusion and ha cocktail.   1635: Medicated per order.   1730: pt resting quietly with eyes closed. Woke pt up. Pt states ha gone and feels much better.   1810: Discharge instructions given with pt understanding. Pt left unit via ambulatory taken home with someone driving.

## 2016-07-13 ENCOUNTER — Ambulatory Visit (INDEPENDENT_AMBULATORY_CARE_PROVIDER_SITE_OTHER): Payer: Medicaid Other | Admitting: Obstetrics and Gynecology

## 2016-07-13 VITALS — BP 143/79 | HR 99 | Wt 152.5 lb

## 2016-07-13 DIAGNOSIS — O0990 Supervision of high risk pregnancy, unspecified, unspecified trimester: Secondary | ICD-10-CM

## 2016-07-13 DIAGNOSIS — O3432 Maternal care for cervical incompetence, second trimester: Secondary | ICD-10-CM | POA: Diagnosis not present

## 2016-07-13 DIAGNOSIS — O3430 Maternal care for cervical incompetence, unspecified trimester: Secondary | ICD-10-CM

## 2016-07-13 DIAGNOSIS — O0992 Supervision of high risk pregnancy, unspecified, second trimester: Secondary | ICD-10-CM

## 2016-07-13 DIAGNOSIS — O09299 Supervision of pregnancy with other poor reproductive or obstetric history, unspecified trimester: Secondary | ICD-10-CM

## 2016-07-13 DIAGNOSIS — O0932 Supervision of pregnancy with insufficient antenatal care, second trimester: Secondary | ICD-10-CM

## 2016-07-13 DIAGNOSIS — O09212 Supervision of pregnancy with history of pre-term labor, second trimester: Secondary | ICD-10-CM | POA: Diagnosis not present

## 2016-07-13 DIAGNOSIS — O09219 Supervision of pregnancy with history of pre-term labor, unspecified trimester: Secondary | ICD-10-CM

## 2016-07-13 DIAGNOSIS — O09292 Supervision of pregnancy with other poor reproductive or obstetric history, second trimester: Secondary | ICD-10-CM | POA: Diagnosis not present

## 2016-07-13 DIAGNOSIS — O09899 Supervision of other high risk pregnancies, unspecified trimester: Secondary | ICD-10-CM

## 2016-07-13 DIAGNOSIS — O0930 Supervision of pregnancy with insufficient antenatal care, unspecified trimester: Secondary | ICD-10-CM

## 2016-07-13 NOTE — Progress Notes (Signed)
   PRENATAL VISIT NOTE  Subjective:  Margaret Keller is a 21 y.o. G4P0301 at 6881w1d being seen today for ongoing prenatal care.  [name], currently pregnant; Cervical insufficiency during pregnancy, antepartum; History of pre-eclampsia in prior pregnancy, currently pregnant; Supervision of high risk pregnancy, antepartum; Cervical cerclage suture present- Placed 05/13/2016; Late prenatal care affecting pregnancy, antepartum; [name]  [name]  Contractions: Not present.  .  Movement: Present. Denies leaking of fluid.   [name], current medications, past family history, past medical history, past social history, past surgical history and problem list. Problem list updated.  Objective:   Vitals:   07/13/16 1513  BP: (!) 143/79  Pulse: 99  Weight: 152 lb 8 oz (69.2 kg)    Fetal Status: Fetal Heart Rate (bpm): 141 Fundal Height: 24 cm Movement: Present     General:  Alert, oriented and cooperative. Patient is in no acute distress.  Skin: Skin is warm and dry. No rash noted.   Cardiovascular: Normal heart rate noted  Respiratory: Normal respiratory effort, no problems with respiration noted  Abdomen: Soft, gravid, appropriate for gestational age. Pain/Pressure: Present     Pelvic:  Cervical exam deferred        Extremities: Normal range of motion.  Edema: None  Mental Status: Normal mood and affect. Normal behavior. Normal judgment and thought content.   Assessment and Plan:  Pregnancy: G4P0301 at 5981w1d  1. Supervision of high risk pregnancy, antepartum Patient reports doing well. She reports HA well controlled on Excedrin she is not taking percocet Patient with elevated BP today. This is her second elevated BP since her MAU visit on 7/1 meeting  criteria for diagnosis of GHTN Continue close monitoring  2. Cervical insufficiency during pregnancy, antepartum S/p cerclage Continue weekly 17-P 3. History of pre-eclampsia in prior pregnancy, currently pregnant Continue ASA  4. Late prenatal care affecting pregnancy, antepartum   5. History of preterm delivery, currently pregnant See above  Preterm labor symptoms and general obstetric precautions including but not limited to vaginal bleeding, contractions, leaking of fluid and fetal movement were reviewed in detail with the patient. Please refer to After Visit Summary for other counseling recommendations.  Return in about 2 weeks (around 07/27/2016) for ROB, 2 hr glucola next visit.   Catalina AntiguaPeggy Torion Hulgan, MD

## 2016-07-13 NOTE — Progress Notes (Signed)
Will do 2 hour at next appt  17-P given today

## 2016-07-19 ENCOUNTER — Inpatient Hospital Stay (HOSPITAL_COMMUNITY)
Admission: AD | Admit: 2016-07-19 | Discharge: 2016-07-20 | Disposition: A | Discharge status: Home / Self Care | Payer: Medicaid Other | Source: Ambulatory Visit | Attending: Family Medicine | Admitting: Family Medicine

## 2016-07-19 ENCOUNTER — Encounter (HOSPITAL_COMMUNITY): Payer: Self-pay | Admitting: *Deleted

## 2016-07-19 DIAGNOSIS — Z5321 Procedure and treatment not carried out due to patient leaving prior to being seen by health care provider: Secondary | ICD-10-CM | POA: Insufficient documentation

## 2016-07-19 DIAGNOSIS — O09299 Supervision of pregnancy with other poor reproductive or obstetric history, unspecified trimester: Secondary | ICD-10-CM

## 2016-07-19 DIAGNOSIS — O0930 Supervision of pregnancy with insufficient antenatal care, unspecified trimester: Secondary | ICD-10-CM

## 2016-07-19 DIAGNOSIS — O0990 Supervision of high risk pregnancy, unspecified, unspecified trimester: Secondary | ICD-10-CM

## 2016-07-19 NOTE — MAU Note (Signed)
Urine in lab 

## 2016-07-19 NOTE — MAU Note (Signed)
Pt requested to leave before being seen by a provider. I instructed pt that it would be against medical advice and that the provider was about to walk in the room. Pt stated that she wanted to leave and asked for the papers to sign so she could leave. Pt signed AMA forms. Pt left facility without being seen by a provider.

## 2016-07-19 NOTE — MAU Note (Addendum)
PT  SAYS SHE HAS NOT FELT  BABY MOVE  SINCE  SAT  WHILE  SHE  SHE WAS LAYING  DOWN .   [facility]  [facility] .  IN TRIAGE - [facility]- 145.    HAD H/A ON 7-1 - CAME HERE - GAVE MEDS.-  THEN    ANOTHER  H/A  YESTERDAY- MEDS  DID NOT HELP -PERCOCET.   LAST TIME TOOK PERCOCET - Thursday.   TOOK EXCEDRIN THIS AM -     SAYS  WOKE  THIS AM - HAD VAG  SPOTTING ON UNDERWEAR-  NONE  NOW.       LAST SEX- 2 WEEKS  AGO

## 2016-07-20 ENCOUNTER — Ambulatory Visit: Payer: Self-pay

## 2016-07-23 NOTE — Anesthesia Postprocedure Evaluation (Signed)
Anesthesia Post Note  Patient: Margaret Keller  Procedure(s) Performed: Procedure(s) (LRB): CERCLAGE CERVICAL (N/A)     Anesthesia Post Evaluation  Last Vitals:  Vitals:   05/13/16 1800 05/13/16 1835  BP: 118/85 122/74  Pulse: (!) 101 98  Resp: (!) 23 16  Temp: 36.8 C 37.3 C    Last Pain:  Vitals:   05/17/16 1010  TempSrc:   PainSc: 0-No pain                 Alishia Lebo EDWARD

## 2016-07-23 NOTE — Addendum Note (Signed)
Addendum  created 07/23/16 1210 by Shawntell Dixson, MD   Sign clinical note    

## 2016-07-26 ENCOUNTER — Encounter: Payer: Self-pay | Admitting: Obstetrics and Gynecology

## 2016-07-26 ENCOUNTER — Inpatient Hospital Stay (HOSPITAL_COMMUNITY)
Admission: AD | Admit: 2016-07-26 | Discharge: 2016-07-26 | Disposition: A | Discharge status: Home / Self Care | Payer: Medicaid Other | Source: Ambulatory Visit | Attending: Obstetrics & Gynecology | Admitting: Obstetrics & Gynecology

## 2016-07-26 ENCOUNTER — Encounter (HOSPITAL_COMMUNITY): Payer: Self-pay | Admitting: *Deleted

## 2016-07-26 ENCOUNTER — Ambulatory Visit (INDEPENDENT_AMBULATORY_CARE_PROVIDER_SITE_OTHER): Payer: Medicaid Other | Admitting: Obstetrics and Gynecology

## 2016-07-26 VITALS — BP 131/83 | HR 98 | Wt 153.5 lb

## 2016-07-26 DIAGNOSIS — O3430 Maternal care for cervical incompetence, unspecified trimester: Secondary | ICD-10-CM

## 2016-07-26 DIAGNOSIS — O26893 Other specified pregnancy related conditions, third trimester: Secondary | ICD-10-CM

## 2016-07-26 DIAGNOSIS — Z3A28 28 weeks gestation of pregnancy: Secondary | ICD-10-CM | POA: Diagnosis not present

## 2016-07-26 DIAGNOSIS — R51 Headache: Secondary | ICD-10-CM

## 2016-07-26 DIAGNOSIS — O3433 Maternal care for cervical incompetence, third trimester: Secondary | ICD-10-CM | POA: Diagnosis present

## 2016-07-26 DIAGNOSIS — O133 Gestational [pregnancy-induced] hypertension without significant proteinuria, third trimester: Secondary | ICD-10-CM

## 2016-07-26 DIAGNOSIS — Z7982 Long term (current) use of aspirin: Secondary | ICD-10-CM | POA: Diagnosis not present

## 2016-07-26 DIAGNOSIS — Z23 Encounter for immunization: Secondary | ICD-10-CM | POA: Diagnosis not present

## 2016-07-26 DIAGNOSIS — R519 Headache, unspecified: Secondary | ICD-10-CM

## 2016-07-26 DIAGNOSIS — O09213 Supervision of pregnancy with history of pre-term labor, third trimester: Secondary | ICD-10-CM | POA: Diagnosis not present

## 2016-07-26 DIAGNOSIS — O0993 Supervision of high risk pregnancy, unspecified, third trimester: Secondary | ICD-10-CM | POA: Diagnosis not present

## 2016-07-26 DIAGNOSIS — O139 Gestational [pregnancy-induced] hypertension without significant proteinuria, unspecified trimester: Secondary | ICD-10-CM | POA: Insufficient documentation

## 2016-07-26 DIAGNOSIS — O0990 Supervision of high risk pregnancy, unspecified, unspecified trimester: Secondary | ICD-10-CM

## 2016-07-26 DIAGNOSIS — O9989 Other specified diseases and conditions complicating pregnancy, childbirth and the puerperium: Secondary | ICD-10-CM

## 2016-07-26 DIAGNOSIS — O26899 Other specified pregnancy related conditions, unspecified trimester: Secondary | ICD-10-CM | POA: Insufficient documentation

## 2016-07-26 LAB — URINALYSIS, ROUTINE W REFLEX MICROSCOPIC
Bilirubin Urine: NEGATIVE
Glucose, UA: 50 mg/dL — AB
HGB URINE DIPSTICK: NEGATIVE
Ketones, ur: NEGATIVE mg/dL
Nitrite: NEGATIVE
PROTEIN: NEGATIVE mg/dL
SPECIFIC GRAVITY, URINE: 1.004 — AB (ref 1.005–1.030)
pH: 7 (ref 5.0–8.0)

## 2016-07-26 LAB — CBC
HEMATOCRIT: 36.2 % (ref 36.0–46.0)
HEMOGLOBIN: 12.7 g/dL (ref 12.0–15.0)
MCH: 29.7 pg (ref 26.0–34.0)
MCHC: 35.1 g/dL (ref 30.0–36.0)
MCV: 84.6 fL (ref 78.0–100.0)
Platelets: 138 10*3/uL — ABNORMAL LOW (ref 150–400)
RBC: 4.28 MIL/uL (ref 3.87–5.11)
RDW: 14.2 % (ref 11.5–15.5)
WBC: 11.8 10*3/uL — ABNORMAL HIGH (ref 4.0–10.5)

## 2016-07-26 LAB — POCT URINALYSIS DIP (DEVICE)
Glucose, UA: NEGATIVE mg/dL
HGB URINE DIPSTICK: NEGATIVE
KETONES UR: NEGATIVE mg/dL
Nitrite: NEGATIVE
PH: 6.5 (ref 5.0–8.0)
Protein, ur: 100 mg/dL — AB
Specific Gravity, Urine: >=1.03 (ref 1.005–1.030)
Urobilinogen, UA: 0.2 mg/dL (ref 0.0–1.0)

## 2016-07-26 LAB — COMPREHENSIVE METABOLIC PANEL
ALK PHOS: 78 U/L (ref 38–126)
ALT: 12 U/L — ABNORMAL LOW (ref 14–54)
ANION GAP: 7 (ref 5–15)
AST: 20 U/L (ref 15–41)
Albumin: 3 g/dL — ABNORMAL LOW (ref 3.5–5.0)
BILIRUBIN TOTAL: 0.2 mg/dL — AB (ref 0.3–1.2)
BUN: 5 mg/dL — AB (ref 6–20)
CALCIUM: 8.4 mg/dL — AB (ref 8.9–10.3)
CO2: 20 mmol/L — ABNORMAL LOW (ref 22–32)
Chloride: 107 mmol/L (ref 101–111)
Creatinine, Ser: 0.64 mg/dL (ref 0.44–1.00)
GFR calc Af Amer: >60 mL/min (ref >60)
Glucose, Bld: 130 mg/dL — ABNORMAL HIGH (ref 65–99)
POTASSIUM: 3.3 mmol/L — AB (ref 3.5–5.1)
Sodium: 134 mmol/L — ABNORMAL LOW (ref 135–145)
TOTAL PROTEIN: 6.5 g/dL (ref 6.5–8.1)

## 2016-07-26 LAB — GLUCOSE, CAPILLARY: Glucose-Capillary: 106 mg/dL — ABNORMAL HIGH (ref 65–99)

## 2016-07-26 LAB — PROTEIN / CREATININE RATIO, URINE
CREATININE, URINE: 55 mg/dL
Total Protein, Urine: <6 mg/dL

## 2016-07-26 MED ORDER — METOCLOPRAMIDE HCL 5 MG/ML IJ SOLN
10.0000 mg | Freq: Once | INTRAMUSCULAR | Status: AC
  Administered 2016-07-26: 10 mg via INTRAVENOUS
  Filled 2016-07-26: qty 2

## 2016-07-26 MED ORDER — HYDROXYPROGESTERONE CAPROATE 275 MG/1.1ML ~~LOC~~ SOAJ
275.0000 mg | Freq: Once | SUBCUTANEOUS | Status: DC
Start: 2016-07-26 — End: 2016-09-23

## 2016-07-26 MED ORDER — LACTATED RINGERS IV BOLUS (SEPSIS)
1000.0000 mL | Freq: Once | INTRAVENOUS | Status: AC
  Administered 2016-07-26: 1000 mL via INTRAVENOUS

## 2016-07-26 MED ORDER — CYCLOBENZAPRINE HCL 5 MG PO TABS: 5.0000 mg | ORAL_TABLET | Freq: Three times a day (TID) | ORAL | 0 refills | Status: DC | PRN

## 2016-07-26 MED ORDER — DEXAMETHASONE SODIUM PHOSPHATE 10 MG/ML IJ SOLN
10.0000 mg | Freq: Once | INTRAMUSCULAR | Status: AC
  Administered 2016-07-26: 10 mg via INTRAVENOUS
  Filled 2016-07-26: qty 1

## 2016-07-26 MED ORDER — DIPHENHYDRAMINE HCL 50 MG/ML IJ SOLN
25.0000 mg | Freq: Once | INTRAMUSCULAR | Status: AC
  Administered 2016-07-26: 25 mg via INTRAVENOUS
  Filled 2016-07-26: qty 1

## 2016-07-26 MED ORDER — BUTALBITAL-APAP-CAFFEINE 50-325-40 MG PO TABS
1.0000 | ORAL_TABLET | Freq: Once | ORAL | Status: AC
  Administered 2016-07-26: 1 via ORAL

## 2016-07-26 NOTE — MAU Provider Note (Signed)
History     CSN: [id number]  Arrival date and time: 07/26/16 1124  First Provider Initiated Contact with Patient 07/26/16 1212      Chief Complaint  Patient presents with  . Headache  . Dizziness   HPI Margaret Keller is a 21 y.o. [id number] at [redacted]w[redacted]d who presents with headache. Reports recurrent headaches for the last month. Recently diagnosed with PIH. Was seen in office this morning to ROB & was given dose of fioricet; reports no improvement in pain. Headaches starts in neck & shoulders & radiates to occipital region. Describes as constant dull throbbing that she rates 8/10. Lights make pain worse. Has been taking tylenol at home without relief. Reports feelings of dizziness & blurred vision for the last week. Episode of left upper chest pain last night that lasted for 10 minutes and resolved without intervention. Denies n/v/d, abdominal pain, LOF, vaginal bleeding, or epigastric pain. Positive fetal movement.  Pt has hx of preeclampsia in previous pregnancy & cerclage during this pregnancy d/t cervical insufficiency.   OB History    Gravida Para Term Preterm AB Living   4 3   3   1    SAB TAB Ectopic Multiple Live Births         0 2      Past Medical History:  Diagnosis Date  . Cervical insufficiency during pregnancy, antepartum   . History of cervical cerclage   . Preterm labor   . SVD (spontaneous vaginal delivery) 03/26/2015   x 1    Past Surgical History:  Procedure Laterality Date  . CERVICAL CERCLAGE N/A 11/19/2014   Procedure: CERCLAGE CERVICALw/spinal;  Surgeon: Tereso Newcomer, MD;  Location: [facility];  Service: Gynecology;  Laterality: N/A;  . CERVICAL CERCLAGE N/A 05/13/2016   Procedure: CERCLAGE CERVICAL;  Surgeon: Tilda Burrow, MD;  Location: [facility];  Service: Gynecology;  Laterality: N/A;    Family History  Problem Relation Age of Onset  . Hypertension Mother   . Diabetes Father     Social History  Substance Use Topics  . Smoking status: Never Smoker  .  Smokeless tobacco: Never Used  . Alcohol use No    Allergies: No Known Allergies  Facility-Administered Medications Prior to Admission  Medication Dose Route Frequency Provider Last Rate Last Dose  . hydroxyprogesterone caproate (MAKENA) 250 mg/mL injection 250 mg  250 mg Intramuscular Weekly Reva Bores, MD   250 mg at 07/26/16 0944  . HYDROXYprogesterone Caproate SOAJ 275 mg  275 mg Subcutaneous Once Meggett Bing, MD       Prescriptions Prior to Admission  Medication Sig Dispense Refill Last Dose  . aspirin EC 81 MG tablet Take 81 mg by mouth daily.   Taking  . aspirin-acetaminophen-caffeine (EXCEDRIN MIGRAINE) 250-250-65 MG tablet Take 1 tablet by mouth every 12 (twelve) hours as needed for headache. (Patient not taking: Reported on 07/26/2016) 10 tablet 0 Not Taking  . oxyCODONE-acetaminophen (PERCOCET/ROXICET) 5-325 MG tablet Take 1-2 tablets by mouth every 6 (six) hours as needed. (Patient not taking: Reported on 07/13/2016) 10 tablet 0 Not Taking  . Prenatal Vit-Fe Fumarate-FA (PRENATAL MULTIVITAMIN) TABS tablet Take 1 tablet by mouth daily.    Taking    Review of Systems  Constitutional: Negative.   Eyes: Positive for photophobia.  Respiratory: Negative.   Cardiovascular: Positive for chest pain. Negative for palpitations and leg swelling.  Gastrointestinal: Negative.   Genitourinary: Negative.   Musculoskeletal: Positive for neck pain.  Neurological: Positive for light-headedness and  headaches. Negative for syncope.   Physical Exam   Blood pressure 138/65, pulse (!) 109, temperature 98.2 F (36.8 C), resp. rate 18, last menstrual period 01/12/2016, SpO2 99 %, unknown if currently breastfeeding.  Temp:  [98.2 F (36.8 C)] 98.2 F (36.8 C) (07/16 1134) Pulse Rate:  [92-115] 109 (07/16 1511) Resp:  [18] 18 (07/16 1511) BP: (130-138)/(61-83) 138/65 (07/16 1511) SpO2:  [99 %] 99 % (07/16 1134) Weight:  [153 lb 8 oz (69.6 kg)] 153 lb 8 oz (69.6 kg) (07/16  0845)   Physical Exam  Nursing note and vitals reviewed. Constitutional: She is oriented to person, place, and time. She appears well-developed and well-nourished. No distress.  HENT:  Head: Normocephalic and atraumatic.  Eyes: Conjunctivae are normal. Right eye exhibits no discharge. Left eye exhibits no discharge. No scleral icterus.  Neck: Normal range of motion.  Cardiovascular: Normal rate, regular rhythm and normal heart sounds.   No murmur heard. Respiratory: Effort normal and breath sounds normal. No respiratory distress. She has no wheezes. She exhibits tenderness (left upper chest).  GI: Soft. Bowel sounds are normal. There is no tenderness.  Musculoskeletal: She exhibits no edema.  Neurological: She is alert and oriented to person, place, and time. She has normal reflexes.  No clonus  Skin: Skin is warm and dry. She is not diaphoretic.  Psychiatric: She has a normal mood and affect. Her behavior is normal. Judgment and thought content normal.   Dilation: Closed Exam by:: Estanislado SpireE. Kaiser Belluomini NP  Fetal Tracing:  Baseline: 135 Variability: moderate Accelerations: 15x15 Decelerations: none  Toco: UI  MAU Course  Procedures Results for orders placed or performed during the hospital encounter of 07/26/16 (from the past 24 hour(s))  CBC     Status: Abnormal   Collection Time: 07/26/16 11:48 AM  Result Value Ref Range   WBC 11.8 (H) 4.0 - 10.5 K/uL   RBC 4.28 3.87 - 5.11 MIL/uL   Hemoglobin 12.7 12.0 - 15.0 g/dL   HCT 04.536.2 40.936.0 - 81.146.0 %   MCV 84.6 78.0 - 100.0 fL   MCH 29.7 26.0 - 34.0 pg   MCHC 35.1 30.0 - 36.0 g/dL   RDW 91.414.2 78.211.5 - 95.615.5 %   Platelets 138 (L) 150 - 400 K/uL  Comprehensive metabolic panel     Status: Abnormal   Collection Time: 07/26/16 11:48 AM  Result Value Ref Range   Sodium 134 (L) 135 - 145 mmol/L   Potassium 3.3 (L) 3.5 - 5.1 mmol/L   Chloride 107 101 - 111 mmol/L   CO2 20 (L) 22 - 32 mmol/L   Glucose, Bld 130 (H) 65 - 99 mg/dL   BUN 5 (L) 6 -  20 mg/dL   Creatinine, Ser 2.130.64 0.44 - 1.00 mg/dL   Calcium 8.4 (L) 8.9 - 10.3 mg/dL   Total Protein 6.5 6.5 - 8.1 g/dL   Albumin 3.0 (L) 3.5 - 5.0 g/dL   AST 20 15 - 41 U/L   ALT 12 (L) 14 - 54 U/L   Alkaline Phosphatase 78 38 - 126 U/L   Total Bilirubin 0.2 (L) 0.3 - 1.2 mg/dL   GFR calc non Af Amer >60 >60 mL/min   GFR calc Af Amer >60 >60 mL/min   Anion gap 7 5 - 15  Glucose, capillary     Status: Abnormal   Collection Time: 07/26/16 12:25 PM  Result Value Ref Range   Glucose-Capillary 106 (H) 65 - 99 mg/dL  Protein / creatinine ratio, urine  Status: None   Collection Time: 07/26/16 12:38 PM  Result Value Ref Range   Creatinine, Urine 55.00 mg/dL   Total Protein, Urine <6 mg/dL   Protein Creatinine Ratio        0.00 - 0.15 mg/mg[Cre]  Urinalysis, Routine w reflex microscopic     Status: Abnormal   Collection Time: 07/26/16 12:38 PM  Result Value Ref Range   Color, Urine YELLOW YELLOW   APPearance HAZY (A) CLEAR   Specific Gravity, Urine 1.004 (L) 1.005 - 1.030   pH 7.0 5.0 - 8.0   Glucose, UA 50 (A) NEGATIVE mg/dL   Hgb urine dipstick NEGATIVE NEGATIVE   Bilirubin Urine NEGATIVE NEGATIVE   Ketones, ur NEGATIVE NEGATIVE mg/dL   Protein, ur NEGATIVE NEGATIVE mg/dL   Nitrite NEGATIVE NEGATIVE   Leukocytes, UA LARGE (A) NEGATIVE   RBC / HPF 0-5 0 - 5 RBC/hpf   WBC, UA 6-30 0 - 5 WBC/hpf   Bacteria, UA RARE (A) NONE SEEN   Squamous Epithelial / LPF 6-30 (A) NONE SEEN    MDM Reactive NST UI on monitor -- pt reports no abdominal pain -- cervix closed/thick, no strain on cerclage IV headache cocktail given -- headache improving & down to 4/10 PIH labs per recommendation from clinic note this morning -- pt normotensive EKG normal -- chest pain likely MSK since it is reproducable Assessment and Plan  A: 1. Pregnancy headache in third trimester   2. Pregnancy-induced hypertension in third trimester    P: Discharge home Rx flexeril prn headache Discussed reasons to  return to MAU Keep f/u with OB  Judeth Horn 07/26/2016, 12:11 PM

## 2016-07-26 NOTE — Discharge Instructions (Signed)
General Headache Without Cause A headache is pain or discomfort felt around the head or neck area. The specific cause of a headache may not be found. There are many causes and types of headaches. A few common ones are:  Tension headaches.  Migraine headaches.  Cluster headaches.  Chronic daily headaches.  Follow these instructions at home: Watch your condition for any changes. Take these steps to help with your condition: Managing pain  Take over-the-counter and prescription medicines only as told by your health care provider.  Lie down in a dark, quiet room when you have a headache.  If directed, apply ice to the head and neck area: ? Put ice in a plastic bag. ? Place a towel between your skin and the bag. ? Leave the ice on for 20 minutes, 2-3 times per day.  Use a heating pad or hot shower to apply heat to the head and neck area as told by your health care provider.  Keep lights dim if bright lights bother you or make your headaches worse. Eating and drinking  Eat meals on a regular schedule.  Limit alcohol use.  Decrease the amount of caffeine you drink, or stop drinking caffeine. General instructions  Keep all follow-up visits as told by your health care provider. This is important.  Keep a headache journal to help find out what may trigger your headaches. For example, write down: ? What you eat and drink. ? How much sleep you get. ? Any change to your diet or medicines.  Try massage or other relaxation techniques.  Limit stress.  Sit up straight, and do not tense your muscles.  Do not use tobacco products, including cigarettes, chewing tobacco, or e-cigarettes. If you need help quitting, ask your health care provider.  Exercise regularly as told by your health care provider.  Sleep on a regular schedule. Get 7-9 hours of sleep, or the amount recommended by your health care provider. Contact a health care provider if:  Your symptoms are not helped by  medicine.  You have a headache that is different from the usual headache.  You have nausea or you vomit.  You have a fever. Get help right away if:  Your headache becomes severe.  You have repeated vomiting.  You have a stiff neck.  You have a loss of vision.  You have problems with speech.  You have pain in the eye or ear.  You have muscular weakness or loss of muscle control.  You lose your balance or have trouble walking.  You feel faint or pass out.  You have confusion. This information is not intended to replace advice given to you by your health care provider. Make sure you discuss any questions you have with your health care provider. Document Released: 12/28/2004 Document Revised: 06/05/2015 Document Reviewed: 04/22/2014 Elsevier Interactive Patient Education  2017 [employer]  

## 2016-07-26 NOTE — MAU Note (Signed)
Had glucola testing today, was brought up from cafeteria, felt like she was going to pass out.  Feeling dizzy. Had been given Fioricet for HA, did not help.

## 2016-07-26 NOTE — Addendum Note (Signed)
Addended by: Cheree DittoGRAHAM, DEMETRICE A on: 07/26/2016 11:38 AM   Modules accepted: Orders

## 2016-07-26 NOTE — Progress Notes (Signed)
Prenatal Visit Note Date: 07/26/2016 [facility]  Subjective:  Margaret Keller is a 21 y.o. [phone] at 1915w0d being seen today for ongoing prenatal care.  [name], currently pregnant; Cervical insufficiency during pregnancy, antepartum; History of pre-eclampsia in prior pregnancy, currently pregnant; Supervision of high risk pregnancy, antepartum; Cervical cerclage suture present- Placed 05/13/2016; Late prenatal care affecting pregnancy, antepartum; Anxiety; Gestational hypertension; and Headache in pregnancy on her problem list.  Patient reports continued HA Patient states she's had them for the past month. She has no h/o HA outside of pregnancy. She states the percocet doesn't work (she's not taking them anymore) and the excedrin didn't really work. Rarely she has some blurry vision (none currently) but no other visual s/s. HA occurs qday, a few times a day and lasts for about 2-3hours each time. She states it starts in her upper shoulders/trapezius area and then goes to the occipital region.  No chest pain, SOB, abdominal pain or decreased FM  Contractions: Irritability. Vag. Bleeding: None.  Movement: Present. Denies leaking of fluid.   [name], current medications, past family history, past medical history, past social history, past surgical history and problem list. Problem list updated.  Objective:   Vitals:   07/26/16 0845  BP: 131/83  Pulse: 98  Weight: 153 lb 8 oz (69.6 kg)    Fetal Status: Fetal Heart Rate (bpm): 155 Fundal Height: 29 cm Movement: Present     General:  Alert, oriented and cooperative. Patient is in no acute distress.  Skin: Skin is warm and dry. No rash noted.   Cardiovascular: Normal heart rate noted  Respiratory: Normal respiratory  effort, no problems with respiration noted  Abdomen: Soft, gravid, appropriate for gestational age. Pain/Pressure: Absent     Pelvic:  Cervical exam deferred        Extremities: Normal range of motion.  Edema: None  Mental Status: Normal mood and affect. Normal behavior. Normal judgment and thought content.   Normal s1 and s2, no MRGs CTAB EOMI, PEERL, CN 2-12 grossly intact. 5/5 strength diffusely and normal sensation. Gait normal.  2+ brachial  Urinalysis:      Assessment and Plan:  Pregnancy: G4P0301 at 4415w0d  1. Supervision of high risk pregnancy, antepartum Routine care. 28wk labs today.  - Glucose Tolerance, 2 Hours w/1 Hour - HIV antibody - CBC - RPR - Tdap vaccine greater than or equal to 7yo IM - ABO AND RH  - Antibody screen  2. Cervical insufficiency during pregnancy, antepartum ppx mcdonald cerclage in place. 17p today. Pt got two weeks ago but not last week  3. Pregnancy-induced hypertension in third trimester Normal BPs today. Dx last visit at 26wks but not set up for AP testing. Will do surveillance labs today. Pt states she's never tried fioricet; sounds similar to tension HA.   Since she has to wait for the 2hr GTT will do a dose and see if that helps. If not, will send to triage for pre-eclampsia labs and serial BPs and IV medications, such as reglan, compazine, benadryl, etc to see if that helps. Possible AP admission for neurology and/or MFM consult for HA work up in the setting of gestational HTN if any lab abnormalities, severe range BPs or refractory to IV meds.  Surveillance growth u/s (1st was at 30wks) and BPP  to start this week - [place] MFM OB FOLLOW UP; Future - Protein / Creatinine Ratio, Urine - TSH - Comprehensive metabolic panel - [place] MFM FETAL BPP WO NON STRESS; Future  4. Pregnancy headache in third trimester See above.   Preterm labor symptoms and general obstetric precautions including but not limited to vaginal bleeding, contractions,  leaking of fluid and fetal movement were reviewed in detail with the patient. Please refer to After Visit Summary for other counseling recommendations.  Return in about 1 week (around 08/02/2016) for 17p and BPP with diane. needs qwk BPPs until 32wks.   Millerton Bing, MD   ROB Update Patient still with HA after one tab of fioricet. I told her to go eat some lunch. # for clinic back line given and RNs aware. If feels better, than no need for her to go to MAU. If not better, recommend she go to MAU for pre-eclampsia w/u, PIV and consideration of IV HA medications. Patient to call us and let us know how she feels after lunch. Patient verbalizes understanding and is amenable to plan  Cornelia Copa MD Attending [facility] (Faculty Practice) 07/26/2016 Time: 1058am

## 2016-07-28 LAB — CBC
Hematocrit: 37.9 % (ref 34.0–46.6)
Hemoglobin: 12.8 g/dL (ref 11.1–15.9)
MCH: 28.6 pg (ref 26.6–33.0)
MCHC: 33.8 g/dL (ref 31.5–35.7)
MCV: 85 fL (ref 79–97)
PLATELETS: 148 10*3/uL — AB (ref 150–379)
RBC: 4.47 x10E6/uL (ref 3.77–5.28)
RDW: 15.2 % (ref 12.3–15.4)
WBC: 12.4 10*3/uL — AB (ref 3.4–10.8)

## 2016-07-28 LAB — GLUCOSE TOLERANCE, 2 HOURS W/ 1HR
GLUCOSE, 1 HOUR: 177 mg/dL (ref 65–179)
GLUCOSE, FASTING: 83 mg/dL (ref 65–91)
Glucose, 2 hour: 134 mg/dL (ref 65–152)

## 2016-07-28 LAB — ANTIBODY SCREEN: Antibody Screen: NEGATIVE

## 2016-07-28 LAB — COMPREHENSIVE METABOLIC PANEL
A/G RATIO: 1.4 (ref 1.2–2.2)
ALK PHOS: 90 IU/L (ref 39–117)
ALT: 11 IU/L (ref 0–32)
AST: 16 IU/L (ref 0–40)
Albumin: 3.8 g/dL (ref 3.5–5.5)
BILIRUBIN TOTAL: 0.2 mg/dL (ref 0.0–1.2)
BUN/Creatinine Ratio: 8 — ABNORMAL LOW (ref 9–23)
BUN: 5 mg/dL — ABNORMAL LOW (ref 6–20)
CO2: 18 mmol/L — ABNORMAL LOW (ref 20–29)
Calcium: 8.8 mg/dL (ref 8.7–10.2)
Chloride: 101 mmol/L (ref 96–106)
Creatinine, Ser: 0.61 mg/dL (ref 0.57–1.00)
GFR calc Af Amer: 148 mL/min/{1.73_m2} (ref >59)
GFR, EST NON AFRICAN AMERICAN: 128 mL/min/{1.73_m2} (ref >59)
GLOBULIN, TOTAL: 2.8 g/dL (ref 1.5–4.5)
Glucose: 137 mg/dL — ABNORMAL HIGH (ref 65–99)
POTASSIUM: 4.3 mmol/L (ref 3.5–5.2)
SODIUM: 137 mmol/L (ref 134–144)
Total Protein: 6.6 g/dL (ref 6.0–8.5)

## 2016-07-28 LAB — HIV ANTIBODY (ROUTINE TESTING W REFLEX): HIV SCREEN 4TH GENERATION: NONREACTIVE

## 2016-07-28 LAB — ABO AND RH: RH TYPE: POSITIVE

## 2016-07-28 LAB — TSH: TSH: 1.03 u[IU]/mL (ref 0.450–4.500)

## 2016-07-28 LAB — RPR: RPR: NONREACTIVE

## 2016-07-29 ENCOUNTER — Encounter (HOSPITAL_COMMUNITY): Payer: Self-pay

## 2016-07-29 ENCOUNTER — Encounter: Payer: Self-pay | Admitting: Obstetrics and Gynecology

## 2016-07-29 ENCOUNTER — Ambulatory Visit (HOSPITAL_COMMUNITY)
Admission: RE | Admit: 2016-07-29 | Discharge: 2016-07-29 | Disposition: A | Discharge status: Home / Self Care | Payer: Medicaid Other | Source: Ambulatory Visit | Attending: Obstetrics and Gynecology | Admitting: Obstetrics and Gynecology

## 2016-07-29 ENCOUNTER — Other Ambulatory Visit: Payer: Self-pay | Admitting: Obstetrics and Gynecology

## 2016-07-29 ENCOUNTER — Other Ambulatory Visit (HOSPITAL_COMMUNITY): Payer: Self-pay | Admitting: *Deleted

## 2016-07-29 DIAGNOSIS — Z3A28 28 weeks gestation of pregnancy: Secondary | ICD-10-CM | POA: Insufficient documentation

## 2016-07-29 DIAGNOSIS — O133 Gestational [pregnancy-induced] hypertension without significant proteinuria, third trimester: Secondary | ICD-10-CM

## 2016-07-29 DIAGNOSIS — O0990 Supervision of high risk pregnancy, unspecified, unspecified trimester: Secondary | ICD-10-CM

## 2016-07-29 DIAGNOSIS — O3432 Maternal care for cervical incompetence, second trimester: Secondary | ICD-10-CM | POA: Insufficient documentation

## 2016-07-29 DIAGNOSIS — O09213 Supervision of pregnancy with history of pre-term labor, third trimester: Secondary | ICD-10-CM

## 2016-07-29 DIAGNOSIS — O09293 Supervision of pregnancy with other poor reproductive or obstetric history, third trimester: Secondary | ICD-10-CM | POA: Diagnosis not present

## 2016-07-29 DIAGNOSIS — D696 Thrombocytopenia, unspecified: Secondary | ICD-10-CM | POA: Insufficient documentation

## 2016-07-29 DIAGNOSIS — O99119 Other diseases of the blood and blood-forming organs and certain disorders involving the immune mechanism complicating pregnancy, unspecified trimester: Secondary | ICD-10-CM

## 2016-07-29 LAB — PROTEIN / CREATININE RATIO, URINE
CREATININE, UR: 338.1 mg/dL
PROTEIN UR: 48.4 mg/dL
Protein/Creat Ratio: 143 mg/g creat (ref 0–200)

## 2016-08-05 ENCOUNTER — Ambulatory Visit (HOSPITAL_COMMUNITY)
Admission: RE | Admit: 2016-08-05 | Discharge: 2016-08-05 | Disposition: A | Discharge status: Home / Self Care | Payer: Medicaid Other | Source: Ambulatory Visit | Attending: Obstetrics and Gynecology | Admitting: Obstetrics and Gynecology

## 2016-08-05 ENCOUNTER — Encounter (HOSPITAL_COMMUNITY): Payer: Self-pay

## 2016-08-05 ENCOUNTER — Other Ambulatory Visit: Payer: Self-pay | Admitting: Obstetrics and Gynecology

## 2016-08-05 DIAGNOSIS — O0930 Supervision of pregnancy with insufficient antenatal care, unspecified trimester: Secondary | ICD-10-CM

## 2016-08-05 DIAGNOSIS — O09299 Supervision of pregnancy with other poor reproductive or obstetric history, unspecified trimester: Secondary | ICD-10-CM

## 2016-08-05 DIAGNOSIS — O133 Gestational [pregnancy-induced] hypertension without significant proteinuria, third trimester: Secondary | ICD-10-CM

## 2016-08-05 DIAGNOSIS — O3433 Maternal care for cervical incompetence, third trimester: Secondary | ICD-10-CM | POA: Diagnosis not present

## 2016-08-05 DIAGNOSIS — Z3A29 29 weeks gestation of pregnancy: Secondary | ICD-10-CM | POA: Diagnosis not present

## 2016-08-05 DIAGNOSIS — O0990 Supervision of high risk pregnancy, unspecified, unspecified trimester: Secondary | ICD-10-CM

## 2016-08-09 ENCOUNTER — Ambulatory Visit (HOSPITAL_COMMUNITY): Payer: Medicaid Other

## 2016-08-12 ENCOUNTER — Ambulatory Visit (HOSPITAL_COMMUNITY): Admission: RE | Admit: 2016-08-12 | Payer: Medicaid Other | Source: Ambulatory Visit

## 2016-08-19 ENCOUNTER — Encounter (HOSPITAL_COMMUNITY): Payer: Self-pay

## 2016-08-19 ENCOUNTER — Ambulatory Visit (HOSPITAL_COMMUNITY)
Admission: RE | Admit: 2016-08-19 | Discharge: 2016-08-19 | Disposition: A | Discharge status: Home / Self Care | Payer: Medicaid Other | Source: Ambulatory Visit | Attending: Obstetrics and Gynecology | Admitting: Obstetrics and Gynecology

## 2016-08-25 ENCOUNTER — Encounter: Payer: Self-pay | Admitting: Obstetrics and Gynecology

## 2016-08-25 NOTE — Progress Notes (Signed)
Patient did not keep ROB appointment for 08/25/2016.  Cornelia Copaharlie Juli Odom, Jr MD Attending [facility]ulty [facility])

## 2016-09-09 ENCOUNTER — Ambulatory Visit (INDEPENDENT_AMBULATORY_CARE_PROVIDER_SITE_OTHER): Payer: Medicaid Other | Admitting: Obstetrics & Gynecology

## 2016-09-09 VITALS — BP 126/76 | HR 111 | Wt 163.5 lb

## 2016-09-09 DIAGNOSIS — O3433 Maternal care for cervical incompetence, third trimester: Secondary | ICD-10-CM

## 2016-09-09 DIAGNOSIS — O133 Gestational [pregnancy-induced] hypertension without significant proteinuria, third trimester: Secondary | ICD-10-CM

## 2016-09-09 DIAGNOSIS — D696 Thrombocytopenia, unspecified: Secondary | ICD-10-CM

## 2016-09-09 DIAGNOSIS — O99113 Other diseases of the blood and blood-forming organs and certain disorders involving the immune mechanism complicating pregnancy, third trimester: Secondary | ICD-10-CM

## 2016-09-09 DIAGNOSIS — O09899 Supervision of other high risk pregnancies, unspecified trimester: Secondary | ICD-10-CM

## 2016-09-09 DIAGNOSIS — O09219 Supervision of pregnancy with history of pre-term labor, unspecified trimester: Secondary | ICD-10-CM

## 2016-09-09 DIAGNOSIS — O0990 Supervision of high risk pregnancy, unspecified, unspecified trimester: Secondary | ICD-10-CM

## 2016-09-09 DIAGNOSIS — O0993 Supervision of high risk pregnancy, unspecified, third trimester: Secondary | ICD-10-CM

## 2016-09-09 DIAGNOSIS — O09213 Supervision of pregnancy with history of pre-term labor, third trimester: Secondary | ICD-10-CM

## 2016-09-09 LAB — POCT URINALYSIS DIP (DEVICE)
BILIRUBIN URINE: NEGATIVE
Glucose, UA: 100 mg/dL — AB
HGB URINE DIPSTICK: NEGATIVE
Ketones, ur: NEGATIVE mg/dL
Nitrite: NEGATIVE
Protein, ur: 30 mg/dL — AB
Specific Gravity, Urine: 1.025 (ref 1.005–1.030)
Urobilinogen, UA: 0.2 mg/dL (ref 0.0–1.0)
pH: 7 (ref 5.0–8.0)

## 2016-09-09 NOTE — Patient Instructions (Signed)
Return to clinic for any scheduled appointments or obstetric concerns, or go to MAU for evaluation  

## 2016-09-09 NOTE — Progress Notes (Signed)
   PRENATAL VISIT NOTE  Subjective:  Margaret Keller is a 21 y.o. G4P0301 at 6941w3d being seen today for ongoing prenatal care.  [name], currently pregnant; Cervical insufficiency during pregnancy, antepartum; History of pre-eclampsia in prior pregnancy, currently pregnant; Supervision of high risk pregnancy, antepartum; Cervical cerclage suture present- Placed 05/13/2016; Late prenatal care affecting pregnancy, antepartum; Anxiety; Gestational hypertension; Headache in pregnancy; and Gestational thrombocytopenia (HCC) on her problem list.  [name]  Contractions: Not present. Vag. Bleeding: None.  Movement: Present. Denies leaking of fluid.   [name], current medications, past family history, past medical history, past social history, past surgical history and problem list. Problem list updated.  Objective:   Vitals:   09/09/16 1244  BP: 126/76  Pulse: (!) 111  Weight: 163 lb 8 oz (74.2 kg)    Fetal Status: Fetal Heart Rate (bpm): 131   Movement: Present     General:  Alert, oriented and cooperative. Patient is in no acute distress.  Skin: Skin is warm and dry. No rash noted.   Cardiovascular: Normal heart rate noted  Respiratory: Normal respiratory effort, no problems with respiration noted  Abdomen: Soft, gravid, appropriate for gestational age.  Pain/Pressure: Present     Pelvic: Cervical exam deferred        Extremities: Normal range of motion.  Edema: Trace  Mental Status:  Normal mood and affect. Normal behavior. Normal judgment and thought content.   NST performed today was reviewed and was found to be reactive.  Continue recommended antenatal testing and prenatal care.  Assessment and Plan:  Pregnancy: G4P0301 at 4941w3d  1. Pregnancy-induced hypertension  in third trimester Stable BP. On ASA.  Growth scan and BPP scheduled.  NST done today, BPP to be done early next week. - [place] MFM OB FOLLOW UP; Future - US MFM FETAL BPP WO NON STRESS; Future - CBC - Comprehensive metabolic panel - Fetal nonstress test IOL scheduled at 37 weeks.   2. Benign gestational thrombocytopenia in third trimester (HCC) - CBC checked today  3. History of preterm delivery, currently pregnant 4. Cervical cerclage suture present in third trimester Declined any 17P, has taken sporadically. PTL precautions reviewed.   5. Supervision of high risk pregnancy, antepartum Preterm labor symptoms and general obstetric precautions including but not limited to vaginal bleeding, contractions, leaking of fluid and fetal movement were reviewed in detail with the patient. Please refer to After Visit Summary for other counseling recommendations.  Return in about 5 days (around 09/14/2016) for NST/BPP only with Diane.  09/20/16  OB visit, NST (has MFM growth u/s, BPP at 10:15am).   Jaynie CollinsUgonna Arlee Bossard, MD

## 2016-09-10 LAB — COMPREHENSIVE METABOLIC PANEL
A/G RATIO: 1.2 (ref 1.2–2.2)
ALK PHOS: 125 IU/L — AB (ref 39–117)
ALT: 12 IU/L (ref 0–32)
AST: 16 IU/L (ref 0–40)
Albumin: 3.3 g/dL — ABNORMAL LOW (ref 3.5–5.5)
BILIRUBIN TOTAL: 0.2 mg/dL (ref 0.0–1.2)
BUN / CREAT RATIO: 8 — AB (ref 9–23)
BUN: 4 mg/dL — ABNORMAL LOW (ref 6–20)
CO2: 16 mmol/L — ABNORMAL LOW (ref 20–29)
Calcium: 8.5 mg/dL — ABNORMAL LOW (ref 8.7–10.2)
Chloride: 106 mmol/L (ref 96–106)
Creatinine, Ser: 0.53 mg/dL — ABNORMAL LOW (ref 0.57–1.00)
GFR calc Af Amer: 155 mL/min/{1.73_m2} (ref >59)
GFR, EST NON AFRICAN AMERICAN: 134 mL/min/{1.73_m2} (ref >59)
GLOBULIN, TOTAL: 2.7 g/dL (ref 1.5–4.5)
Glucose: 112 mg/dL — ABNORMAL HIGH (ref 65–99)
POTASSIUM: 3.7 mmol/L (ref 3.5–5.2)
Sodium: 137 mmol/L (ref 134–144)
Total Protein: 6 g/dL (ref 6.0–8.5)

## 2016-09-10 LAB — CBC
HEMATOCRIT: 33.6 % — AB (ref 34.0–46.6)
Hemoglobin: 11.5 g/dL (ref 11.1–15.9)
MCH: 28.6 pg (ref 26.6–33.0)
MCHC: 34.2 g/dL (ref 31.5–35.7)
MCV: 84 fL (ref 79–97)
PLATELETS: 133 10*3/uL — AB (ref 150–379)
RBC: 4.02 x10E6/uL (ref 3.77–5.28)
RDW: 13.8 % (ref 12.3–15.4)
WBC: 11.1 10*3/uL — AB (ref 3.4–10.8)

## 2016-09-14 ENCOUNTER — Other Ambulatory Visit: Payer: Self-pay

## 2016-09-16 ENCOUNTER — Encounter: Payer: Self-pay | Admitting: Family Medicine

## 2016-09-17 ENCOUNTER — Encounter (HOSPITAL_COMMUNITY): Payer: Self-pay | Admitting: *Deleted

## 2016-09-17 ENCOUNTER — Inpatient Hospital Stay (HOSPITAL_COMMUNITY)
Admission: AD | Admit: 2016-09-17 | Discharge: 2016-09-17 | Disposition: A | Discharge status: Home / Self Care | Payer: Medicaid Other | Source: Ambulatory Visit | Attending: Obstetrics and Gynecology | Admitting: Obstetrics and Gynecology

## 2016-09-17 DIAGNOSIS — O3433 Maternal care for cervical incompetence, third trimester: Secondary | ICD-10-CM | POA: Insufficient documentation

## 2016-09-17 DIAGNOSIS — Z3A35 35 weeks gestation of pregnancy: Secondary | ICD-10-CM | POA: Diagnosis not present

## 2016-09-17 DIAGNOSIS — O26893 Other specified pregnancy related conditions, third trimester: Secondary | ICD-10-CM

## 2016-09-17 DIAGNOSIS — R51 Headache: Secondary | ICD-10-CM

## 2016-09-17 DIAGNOSIS — O133 Gestational [pregnancy-induced] hypertension without significant proteinuria, third trimester: Secondary | ICD-10-CM | POA: Diagnosis not present

## 2016-09-17 DIAGNOSIS — O368130 Decreased fetal movements, third trimester, not applicable or unspecified: Secondary | ICD-10-CM | POA: Diagnosis not present

## 2016-09-17 DIAGNOSIS — Z3689 Encounter for other specified antenatal screening: Secondary | ICD-10-CM

## 2016-09-17 LAB — CBC
HCT: 36 % (ref 36.0–46.0)
HEMOGLOBIN: 12.4 g/dL (ref 12.0–15.0)
MCH: 28.7 pg (ref 26.0–34.0)
MCHC: 34.4 g/dL (ref 30.0–36.0)
MCV: 83.3 fL (ref 78.0–100.0)
PLATELETS: 129 10*3/uL — AB (ref 150–400)
RBC: 4.32 MIL/uL (ref 3.87–5.11)
RDW: 13.7 % (ref 11.5–15.5)
WBC: 14.6 10*3/uL — ABNORMAL HIGH (ref 4.0–10.5)

## 2016-09-17 LAB — COMPREHENSIVE METABOLIC PANEL
ALBUMIN: 3 g/dL — AB (ref 3.5–5.0)
ALK PHOS: 143 U/L — AB (ref 38–126)
ALT: 15 U/L (ref 14–54)
ANION GAP: 8 (ref 5–15)
AST: 22 U/L (ref 15–41)
BUN: 7 mg/dL (ref 6–20)
CALCIUM: 8.8 mg/dL — AB (ref 8.9–10.3)
CHLORIDE: 107 mmol/L (ref 101–111)
CO2: 19 mmol/L — AB (ref 22–32)
Creatinine, Ser: 0.51 mg/dL (ref 0.44–1.00)
GFR calc non Af Amer: >60 mL/min (ref >60)
GLUCOSE: 80 mg/dL (ref 65–99)
Potassium: 4.1 mmol/L (ref 3.5–5.1)
SODIUM: 134 mmol/L — AB (ref 135–145)
Total Bilirubin: 0.5 mg/dL (ref 0.3–1.2)
Total Protein: 6.4 g/dL — ABNORMAL LOW (ref 6.5–8.1)

## 2016-09-17 LAB — PROTEIN / CREATININE RATIO, URINE
Creatinine, Urine: 101 mg/dL
Protein Creatinine Ratio: 0.27 mg/mg{Cre} — ABNORMAL HIGH (ref 0.00–0.15)
TOTAL PROTEIN, URINE: 27 mg/dL

## 2016-09-17 MED ORDER — BETAMETHASONE SOD PHOS & ACET 6 (3-3) MG/ML IJ SUSP
12.0000 mg | Freq: Once | INTRAMUSCULAR | Status: AC
  Administered 2016-09-17: 12 mg via INTRAMUSCULAR
  Filled 2016-09-17: qty 2

## 2016-09-17 MED ORDER — ACETAMINOPHEN 500 MG PO TABS
1000.0000 mg | ORAL_TABLET | Freq: Once | ORAL | Status: AC
  Administered 2016-09-17: 1000 mg via ORAL
  Filled 2016-09-17: qty 2

## 2016-09-17 MED ORDER — LACTATED RINGERS IV SOLN
INTRAVENOUS | Status: DC
  Administered 2016-09-17: 19:00:00 via INTRAVENOUS

## 2016-09-17 MED ORDER — METOCLOPRAMIDE HCL 5 MG/ML IJ SOLN
10.0000 mg | Freq: Once | INTRAMUSCULAR | Status: AC
  Administered 2016-09-17: 10 mg via INTRAVENOUS
  Filled 2016-09-17: qty 2

## 2016-09-17 MED ORDER — BETAMETHASONE SOD PHOS & ACET 6 (3-3) MG/ML IJ SUSP: 12.0000 mg | Freq: Once | INTRAMUSCULAR | Status: DC

## 2016-09-17 MED ORDER — DEXAMETHASONE SODIUM PHOSPHATE 10 MG/ML IJ SOLN
10.0000 mg | Freq: Once | INTRAMUSCULAR | Status: AC
  Administered 2016-09-17: 10 mg via INTRAVENOUS
  Filled 2016-09-17: qty 1

## 2016-09-17 MED ORDER — DIPHENHYDRAMINE HCL 50 MG/ML IJ SOLN
25.0000 mg | Freq: Once | INTRAMUSCULAR | Status: AC
  Administered 2016-09-17: 25 mg via INTRAVENOUS
  Filled 2016-09-17: qty 1

## 2016-09-17 NOTE — MAU Note (Signed)
Ongoing problems with HA,  We tried to help her before, but they persist.

## 2016-09-17 NOTE — MAU Note (Signed)
Reports decrease in movement since last night.  No bleeding or leaking.  Constant pain/pressure in pelvis. On going HA.

## 2016-09-17 NOTE — MAU Provider Note (Signed)
History     CSN: [id number]  Arrival date and time: 09/17/16 1731   First Provider Initiated Contact with Patient 09/17/16 1800      Chief Complaint  Patient presents with  . Headache  . Decreased Fetal Movement  . Dizziness    HPI: Margaret Keller is a 21 y.o. [id number] with IUP at [redacted]w[redacted]d, and pregnancy complicated by gestational HTN, cervical insufficiency (s/p cerclage this pregnancy), recurrent headaches, and gestational thrombocytopenia, who presents to maternity admissions reporting c/o headache, dizziness, and decreased fetal movement. She reports she has had frequent headaches this pregnancy, and has been seen in MAU for this before. Reports that home Tylenol typically doesn't help, and she has also been prescribed Fioricet and Flexeril in the past, but she said those meds don't work for her either. She reports this headache started yesterday, and she has not taken anything for this. HA is right sided (her Has are typically one-sided), and has some associated blurry vision at times, which she states also occurs with her headaches. She reports feeling some dizziness as well, and this is new. Endorses increase in LE swelling as well.   She reports baby has been moving today, but seems less than usual. Endorses occasional contractions, but reports these are not regular, and >15 min apart. Denies leakage of fluid or vaginal bleeding. Also denies any abnormal vaginal discharge, fevers, chills, sweats, dysuria, nausea, vomiting, other GI or GU symptoms or other general symptoms.  She receives [facility]. Pregnancy complicated by: Patient Active Problem List   Diagnosis Date Noted  . Gestational thrombocytopenia (HCC) 07/29/2016  . Gestational hypertension 07/26/2016  . Headache in pregnancy 07/26/2016  . Anxiety 06/09/2016  . Cervical cerclage suture present- Placed 05/13/2016 05/23/2016  . Late prenatal care affecting pregnancy, antepartum 05/23/2016  . Supervision of high risk pregnancy,  antepartum 05/12/2016  . History of pre-eclampsia in prior pregnancy, currently pregnant 05/11/2016  . History of preterm delivery, currently pregnant 11/07/2014  . Cervical insufficiency during pregnancy, antepartum 11/07/2014  ;  Past obstetric history: OB History  Gravida Para Term Preterm AB Living  SAB TAB Ectopic Multiple Live Births        0 2    # Outcome Date GA Lbr Len/2nd Weight Sex Delivery Anes PTL Lv  4 Current           3 Preterm 03/26/15 [redacted]w[redacted]d  6 lb 7.2 oz (2.925 kg) F Vag-Spont EPI  LIV  2 Preterm 05/31/14 [redacted]w[redacted]d 08:50 / 01:42 1 lb 2.4 oz (0.522 kg) M Vag-Spont None  ND     Birth Comments: 22 weeks. Eyes fused.  1 Preterm 10/23/13 [redacted]w[redacted]d 04:31 / 00:02 12.2 oz (0.346 kg) M Vag-Spont None  FD      Past Medical History:  Diagnosis Date  . Cervical insufficiency during pregnancy, antepartum   . History of cervical cerclage   . Preterm labor   . SVD (spontaneous vaginal delivery) 03/26/2015   x 1    Past Surgical History:  Procedure Laterality Date  . CERVICAL CERCLAGE N/A 11/19/2014   Procedure: CERCLAGE CERVICALw/spinal;  Surgeon: Tereso Newcomer, MD;  Location: [facility];  Service: Gynecology;  Laterality: N/A;  . CERVICAL CERCLAGE N/A 05/13/2016   Procedure: CERCLAGE CERVICAL;  Surgeon: Tilda Burrow, MD;  Location: [facility];  Service: Gynecology;  Laterality: N/A;    Family History  Problem Relation Age of Onset  . Hypertension Mother   .  Diabetes Father     Social History  Substance Use Topics  . Smoking status: Never Smoker  . Smokeless tobacco: Never Used  . Alcohol use No    Allergies: No Known Allergies  Facility-Administered Medications Prior to Admission  Medication Dose Route Frequency Provider Last Rate Last Dose  . HYDROXYprogesterone Caproate SOAJ 275 mg  275 mg Subcutaneous Once [place] BingPickens, Charlie, MD       Prescriptions Prior to Admission  Medication Sig Dispense Refill Last Dose  . aspirin EC 81 MG tablet Take 81 mg by  mouth daily.   Taking  . cyclobenzaprine (FLEXERIL) 5 MG tablet Take 1 tablet (5 mg total) by mouth 3 (three) times daily as needed for muscle spasms. (Patient not taking: Reported on 09/09/2016) 15 tablet 0 Not Taking  . oxyCODONE-acetaminophen (PERCOCET/ROXICET) 5-325 MG tablet Take 1-2 tablets by mouth every 6 (six) hours as needed. (Patient not taking: Reported on 07/13/2016) 10 tablet 0 Not Taking  . Prenatal Vit-Fe Fumarate-FA (PRENATAL MULTIVITAMIN) TABS tablet Take 1 tablet by mouth daily.    Taking    Review of Systems - Negative except for what is mentioned in HPI.  Physical Exam   Blood pressure 135/86, pulse (!) 117, temperature 99.6 F (37.6 C), resp. rate 18, last menstrual period 01/12/2016, unknown if currently breastfeeding.  Constitutional: Well-developed, well-nourished female in no acute distress.  HENT: HC/AT. Normal oral mucosa. MMM Eyes: EOMI, PERRL, sclerae anicteric Cardiovascular: increased rate, regular rythm Respiratory: normal effort, lungs CTAB.  GI: Abd soft, non-tender, gravid appropriate for gestational age.   MSK: Extremities nontender, normal ROM, trace ededma Neurologic: Alert and oriented x 4. Cns II-CII intact. DTRs 2+ equal bilaterally, no clonus. Strength 5/5 on all 4 extremities.  Psych: Normal mood and affect   FHT:  Baseline 145 , moderate variability, accelerations present, no decelerations Toco: irregular ctx, ~q10-15 mins  MAU Course  Procedures  MDM. Pt seen and examined. Reactive NST 6:24 PM  IV headache cocktail ordered. UPC, CBC and CMP ordered 7:30 PM Headache improved from 7/10 to 4/10. Reports dizziness also improved. Labs reviewed: Results for orders placed or performed during the hospital encounter of 09/17/16  CBC  Result Value Ref Range   WBC 14.6 (H) 4.0 - 10.5 K/uL   RBC 4.32 3.87 - 5.11 MIL/uL   Hemoglobin 12.4 12.0 - 15.0 g/dL   HCT 16.136.0 09.636.0 - 04.546.0 %   MCV 83.3 78.0 - 100.0 fL   MCH 28.7 26.0 - 34.0 pg   MCHC 34.4  30.0 - 36.0 g/dL   RDW 40.913.7 81.111.5 - 91.415.5 %   Platelets 129 (L) 150 - 400 K/uL  Comprehensive metabolic panel  Result Value Ref Range   Sodium 134 (L) 135 - 145 mmol/L   Potassium 4.1 3.5 - 5.1 mmol/L   Chloride 107 101 - 111 mmol/L   CO2 19 (L) 22 - 32 mmol/L   Glucose, Bld 80 65 - 99 mg/dL   BUN 7 6 - 20 mg/dL   Creatinine, Ser 7.820.51 0.44 - 1.00 mg/dL   Calcium 8.8 (L) 8.9 - 10.3 mg/dL   Total Protein 6.4 (L) 6.5 - 8.1 g/dL   Albumin 3.0 (L) 3.5 - 5.0 g/dL   AST 22 15 - 41 U/L   ALT 15 14 - 54 U/L   Alkaline Phosphatase 143 (H) 38 - 126 U/L   Total Bilirubin 0.5 0.3 - 1.2 mg/dL   GFR calc non Af Amer >60 >60 mL/min   GFR calc  Af Amer >60 >60 mL/min   Anion gap 8 5 - 15  Protein / creatinine ratio, urine  Result Value Ref Range   Creatinine, Urine 101.00 mg/dL   Total Protein, Urine 27 mg/dL   Protein Creatinine Ratio 0.27 (H) 0.00 - 0.15 mg/mg[Cre]   Labs reassuring. Pt's symptoms improving.  Discussed with Dr. Alysia Penna. Will given BMZ and d/c with strict return precautions, and close follow (has appt Monday morning).  Assessment and Plan  Assessment: 1. Pregnancy headache in third trimester   2. Pregnancy-induced hypertension in third trimester   3. NST (non-stress test) reactive     Plan: --BMZ 12 mg given. Pt will return in 24 hours for second BMZ. Has reliable transportation --Dicussed return precautions at length. --Keep appt Monday morning. --Discharge home in stable condition.  --PTL precautions and fetal kick counts   Rainelle Sulewski, Kandra Nicolas, MD 09/17/2016 7:34 PM

## 2016-09-18 ENCOUNTER — Inpatient Hospital Stay (HOSPITAL_COMMUNITY)
Admission: AD | Admit: 2016-09-18 | Discharge: 2016-09-18 | Disposition: A | Discharge status: Home / Self Care | Payer: Medicaid Other | Source: Ambulatory Visit | Attending: Obstetrics & Gynecology | Admitting: Obstetrics & Gynecology

## 2016-09-18 ENCOUNTER — Encounter (HOSPITAL_COMMUNITY): Payer: Self-pay

## 2016-09-18 DIAGNOSIS — R51 Headache: Secondary | ICD-10-CM | POA: Insufficient documentation

## 2016-09-18 DIAGNOSIS — O26893 Other specified pregnancy related conditions, third trimester: Secondary | ICD-10-CM | POA: Diagnosis not present

## 2016-09-18 DIAGNOSIS — O9989 Other specified diseases and conditions complicating pregnancy, childbirth and the puerperium: Secondary | ICD-10-CM | POA: Diagnosis not present

## 2016-09-18 DIAGNOSIS — O3433 Maternal care for cervical incompetence, third trimester: Secondary | ICD-10-CM | POA: Diagnosis not present

## 2016-09-18 DIAGNOSIS — Z3A35 35 weeks gestation of pregnancy: Secondary | ICD-10-CM | POA: Insufficient documentation

## 2016-09-18 LAB — URINALYSIS, ROUTINE W REFLEX MICROSCOPIC
BILIRUBIN URINE: NEGATIVE
Glucose, UA: >=500 mg/dL — AB
Hgb urine dipstick: NEGATIVE
KETONES UR: 80 mg/dL — AB
LEUKOCYTES UA: NEGATIVE
Nitrite: NEGATIVE
PH: 5 (ref 5.0–8.0)
PROTEIN: 30 mg/dL — AB
Specific Gravity, Urine: 1.027 (ref 1.005–1.030)

## 2016-09-18 LAB — AMNISURE RUPTURE OF MEMBRANE (ROM) NOT AT ARMC: AMNISURE: NEGATIVE

## 2016-09-18 MED ORDER — IBUPROFEN 800 MG PO TABS: 800.0000 mg | ORAL_TABLET | Freq: Once | ORAL | Status: DC

## 2016-09-18 MED ORDER — BUTALBITAL-APAP-CAFFEINE 50-325-40 MG PO TABS
2.0000 | ORAL_TABLET | Freq: Once | ORAL | Status: DC
  Filled 2016-09-18: qty 2

## 2016-09-18 MED ORDER — BUTALBITAL-APAP-CAFFEINE 50-325-40 MG PO TABS: 2.0000 | ORAL_TABLET | Freq: Once | ORAL | 0 refills | Status: AC

## 2016-09-18 NOTE — MAU Note (Signed)
Reports headache since yesterday and came to MAU- had some relief yesterday and this morning headache came back and pt reports she took excedrin migraine with no relief. Also seeing spots when she turns her head. Felt a gush when she went to the bathroom earlier. No bleeding, has not felt baby move today.

## 2016-09-18 NOTE — Discharge Instructions (Signed)

## 2016-09-18 NOTE — MAU Provider Note (Signed)
History    [id number] @ 35.5 wks in with c/o headache in preg. This is a ongoing problem for this pt in this pregnancy. Also had gush of fluid this morning with no further occurences.  CSN: [id number]  Arrival date & time 09/18/16  1305   None     Chief Complaint  Patient presents with  . Headache  . Decreased Fetal Movement  . Spots and/or Floaters    HPI  Past Medical History:  Diagnosis Date  . Cervical insufficiency during pregnancy, antepartum   . History of cervical cerclage   . Preterm labor   . SVD (spontaneous vaginal delivery) 03/26/2015   x 1    Past Surgical History:  Procedure Laterality Date  . CERVICAL CERCLAGE N/A 11/19/2014   Procedure: CERCLAGE CERVICALw/spinal;  Surgeon: Tereso Newcomer, MD;  Location: [facility];  Service: Gynecology;  Laterality: N/A;  . CERVICAL CERCLAGE N/A 05/13/2016   Procedure: CERCLAGE CERVICAL;  Surgeon: Tilda Burrow, MD;  Location: [facility];  Service: Gynecology;  Laterality: N/A;    Family History  Problem Relation Age of Onset  . Hypertension Mother   . Diabetes Father     Social History  Substance Use Topics  . Smoking status: Never Smoker  . Smokeless tobacco: Never Used  . Alcohol use No    OB History    Gravida Para Term Preterm AB Living   SAB TAB Ectopic Multiple Live Births         0 2      Review of Systems  Constitutional: Negative.   HENT: Negative.   Eyes: Negative.   Respiratory: Negative.   Cardiovascular: Negative.   Gastrointestinal: Negative.   Endocrine: Negative.   Genitourinary: Negative.   Musculoskeletal: Negative.   Skin: Negative.   Allergic/Immunologic: Negative.   Neurological: Negative.   Hematological: Negative.   Psychiatric/Behavioral: Negative.     Allergies  Patient has no known allergies.  Home Medications    BP 134/78   Pulse (!) 140   Temp 99.3 F (37.4 C) (Oral)   Resp 18   LMP 01/12/2016 (Exact Date)   Physical Exam  Constitutional: She is  oriented to person, place, and time. She appears well-developed and well-nourished.  HENT:  Head: Normocephalic.  Eyes: Pupils are equal, round, and reactive to light.  Neck: Normal range of motion.  Cardiovascular: Normal rate, regular rhythm, normal heart sounds and intact distal pulses.   Pulmonary/Chest: Effort normal and breath sounds normal.  Abdominal: Soft. Bowel sounds are normal.  Genitourinary: Vagina normal and uterus normal.  Musculoskeletal: Normal range of motion.  Neurological: She is alert and oriented to person, place, and time. She has normal reflexes.  Skin: Skin is warm and dry.  Psychiatric: She has a normal mood and affect. Her behavior is normal. Judgment and thought content normal.    MAU Course  Procedures (including critical care time)  Labs Reviewed  URINALYSIS, ROUTINE W REFLEX MICROSCOPIC - Abnormal; Notable for the following:       Result Value   APPearance HAZY (*)    Glucose, UA >=500 (*)    Ketones, ur 80 (*)    Protein, ur 30 (*)    Bacteria, UA RARE (*)    Squamous Epithelial / LPF 6-30 (*)    All other components within normal limits  POCT NITRAZINE TEST   No results found.   1. Headache in pregnancy, third trimester  MDM  FHR pattern reassuring, 15x15 accels no decels. VSS. No uc's much fetal movement noted with exam. Fern neg. amnisure neg. Pt refusing meds for her headache.

## 2016-09-20 ENCOUNTER — Encounter: Payer: Self-pay | Admitting: Family Medicine

## 2016-09-20 ENCOUNTER — Ambulatory Visit (HOSPITAL_COMMUNITY): Admission: RE | Admit: 2016-09-20 | Payer: Medicaid Other | Source: Ambulatory Visit

## 2016-09-20 ENCOUNTER — Inpatient Hospital Stay (HOSPITAL_COMMUNITY): Admission: AD | Admit: 2016-09-20 | Payer: Self-pay | Source: Ambulatory Visit | Admitting: Obstetrics and Gynecology

## 2016-09-20 ENCOUNTER — Inpatient Hospital Stay (HOSPITAL_COMMUNITY): Payer: Medicaid Other | Admitting: Anesthesiology

## 2016-09-20 ENCOUNTER — Inpatient Hospital Stay (HOSPITAL_COMMUNITY)
Admission: AD | Admit: 2016-09-20 | Discharge: 2016-09-23 | DRG: 775 | Disposition: A | Discharge status: Home / Self Care | Payer: Medicaid Other | Source: Ambulatory Visit | Attending: Obstetrics & Gynecology | Admitting: Obstetrics & Gynecology

## 2016-09-20 ENCOUNTER — Other Ambulatory Visit: Payer: Self-pay

## 2016-09-20 ENCOUNTER — Encounter (HOSPITAL_COMMUNITY): Payer: Self-pay

## 2016-09-20 DIAGNOSIS — O3433 Maternal care for cervical incompetence, third trimester: Secondary | ICD-10-CM | POA: Diagnosis present

## 2016-09-20 DIAGNOSIS — D6959 Other secondary thrombocytopenia: Secondary | ICD-10-CM | POA: Diagnosis present

## 2016-09-20 DIAGNOSIS — O9912 Other diseases of the blood and blood-forming organs and certain disorders involving the immune mechanism complicating childbirth: Secondary | ICD-10-CM | POA: Diagnosis present

## 2016-09-20 DIAGNOSIS — O1414 Severe pre-eclampsia complicating childbirth: Secondary | ICD-10-CM | POA: Diagnosis present

## 2016-09-20 DIAGNOSIS — Z7982 Long term (current) use of aspirin: Secondary | ICD-10-CM | POA: Diagnosis not present

## 2016-09-20 DIAGNOSIS — Z3A36 36 weeks gestation of pregnancy: Secondary | ICD-10-CM

## 2016-09-20 DIAGNOSIS — O1413 Severe pre-eclampsia, third trimester: Secondary | ICD-10-CM | POA: Diagnosis present

## 2016-09-20 LAB — COMPREHENSIVE METABOLIC PANEL
ALBUMIN: 3.1 g/dL — AB (ref 3.5–5.0)
ALT: 15 U/L (ref 14–54)
ANION GAP: 8 (ref 5–15)
AST: 24 U/L (ref 15–41)
Alkaline Phosphatase: 125 U/L (ref 38–126)
BUN: 5 mg/dL — ABNORMAL LOW (ref 6–20)
CO2: 20 mmol/L — AB (ref 22–32)
Calcium: 8.5 mg/dL — ABNORMAL LOW (ref 8.9–10.3)
Chloride: 108 mmol/L (ref 101–111)
Creatinine, Ser: 0.5 mg/dL (ref 0.44–1.00)
GFR calc Af Amer: >60 mL/min (ref >60)
GFR calc non Af Amer: >60 mL/min (ref >60)
GLUCOSE: 108 mg/dL — AB (ref 65–99)
POTASSIUM: 3.7 mmol/L (ref 3.5–5.1)
SODIUM: 136 mmol/L (ref 135–145)
TOTAL PROTEIN: 6.6 g/dL (ref 6.5–8.1)
Total Bilirubin: 0.5 mg/dL (ref 0.3–1.2)

## 2016-09-20 LAB — CBC
HCT: 33.8 % — ABNORMAL LOW (ref 36.0–46.0)
Hemoglobin: 11.5 g/dL — ABNORMAL LOW (ref 12.0–15.0)
MCH: 28.8 pg (ref 26.0–34.0)
MCHC: 34 g/dL (ref 30.0–36.0)
MCV: 84.5 fL (ref 78.0–100.0)
Platelets: 112 10*3/uL — ABNORMAL LOW (ref 150–400)
RBC: 4 MIL/uL (ref 3.87–5.11)
RDW: 13.9 % (ref 11.5–15.5)
WBC: 15.1 10*3/uL — ABNORMAL HIGH (ref 4.0–10.5)

## 2016-09-20 LAB — URINALYSIS, ROUTINE W REFLEX MICROSCOPIC
Bilirubin Urine: NEGATIVE
GLUCOSE, UA: NEGATIVE mg/dL
KETONES UR: NEGATIVE mg/dL
Nitrite: NEGATIVE
PH: 7 (ref 5.0–8.0)
PROTEIN: NEGATIVE mg/dL
Specific Gravity, Urine: 1.005 (ref 1.005–1.030)

## 2016-09-20 LAB — TYPE AND SCREEN
ABO/RH(D): O POS
Antibody Screen: NEGATIVE

## 2016-09-20 LAB — PROTEIN / CREATININE RATIO, URINE
Creatinine, Urine: 49 mg/dL
PROTEIN CREATININE RATIO: 0.37 mg/mg{creat} — AB (ref 0.00–0.15)
Total Protein, Urine: 18 mg/dL

## 2016-09-20 MED ORDER — ZOLPIDEM TARTRATE 5 MG PO TABS: 5.0000 mg | ORAL_TABLET | Freq: Every evening | ORAL | Status: DC | PRN

## 2016-09-20 MED ORDER — OXYTOCIN 40 UNITS IN LACTATED RINGERS INFUSION - SIMPLE MED
1.0000 m[IU]/min | INTRAVENOUS | Status: DC
  Administered 2016-09-20: 2 m[IU]/min via INTRAVENOUS
  Filled 2016-09-20: qty 1000

## 2016-09-20 MED ORDER — OXYTOCIN BOLUS FROM INFUSION
500.0000 mL | Freq: Once | INTRAVENOUS | Status: AC
  Administered 2016-09-21: 500 mL via INTRAVENOUS

## 2016-09-20 MED ORDER — PHENYLEPHRINE 40 MCG/ML (10ML) SYRINGE FOR IV PUSH (FOR BLOOD PRESSURE SUPPORT)
80.0000 ug | PREFILLED_SYRINGE | INTRAVENOUS | Status: DC | PRN
  Administered 2016-09-20: 80 ug via INTRAVENOUS
  Filled 2016-09-20: qty 5

## 2016-09-20 MED ORDER — LACTATED RINGERS IV SOLN
500.0000 mL | Freq: Once | INTRAVENOUS | Status: AC
  Administered 2016-09-20: 500 mL via INTRAVENOUS

## 2016-09-20 MED ORDER — HYDRALAZINE HCL 20 MG/ML IJ SOLN: 10.0000 mg | Freq: Once | INTRAMUSCULAR | Status: DC | PRN

## 2016-09-20 MED ORDER — SOD CITRATE-CITRIC ACID 500-334 MG/5ML PO SOLN: 30.0000 mL | ORAL | Status: DC | PRN

## 2016-09-20 MED ORDER — MAGNESIUM SULFATE BOLUS VIA INFUSION
4.0000 g | Freq: Once | INTRAVENOUS | Status: AC
  Administered 2016-09-20: 4 g via INTRAVENOUS
  Filled 2016-09-20: qty 500

## 2016-09-20 MED ORDER — FENTANYL 2.5 MCG/ML BUPIVACAINE 1/10 % EPIDURAL INFUSION (WH - ANES)
14.0000 mL/h | INTRAMUSCULAR | Status: DC | PRN
  Administered 2016-09-20 (×2): 14 mL/h via EPIDURAL
  Filled 2016-09-20 (×2): qty 100

## 2016-09-20 MED ORDER — OXYTOCIN 40 UNITS IN LACTATED RINGERS INFUSION - SIMPLE MED
2.5000 [IU]/h | INTRAVENOUS | Status: DC
  Administered 2016-09-21: 2.5 [IU]/h via INTRAVENOUS

## 2016-09-20 MED ORDER — HYDROXYZINE HCL 50 MG PO TABS
50.0000 mg | ORAL_TABLET | Freq: Four times a day (QID) | ORAL | Status: DC | PRN
  Filled 2016-09-20: qty 1

## 2016-09-20 MED ORDER — DIPHENHYDRAMINE HCL 50 MG/ML IJ SOLN: 12.5000 mg | INTRAMUSCULAR | Status: DC | PRN

## 2016-09-20 MED ORDER — LABETALOL HCL 5 MG/ML IV SOLN: 20.0000 mg | INTRAVENOUS | Status: DC | PRN

## 2016-09-20 MED ORDER — PHENYLEPHRINE 40 MCG/ML (10ML) SYRINGE FOR IV PUSH (FOR BLOOD PRESSURE SUPPORT)
80.0000 ug | PREFILLED_SYRINGE | INTRAVENOUS | Status: DC | PRN
  Administered 2016-09-20: 80 ug via INTRAVENOUS
  Filled 2016-09-20 (×2): qty 10
  Filled 2016-09-20: qty 5

## 2016-09-20 MED ORDER — DEXTROSE 5 % IV SOLN
5.0000 10*6.[IU] | Freq: Once | INTRAVENOUS | Status: AC
  Administered 2016-09-20: 5 10*6.[IU] via INTRAVENOUS
  Filled 2016-09-20: qty 5

## 2016-09-20 MED ORDER — EPHEDRINE 5 MG/ML INJ
10.0000 mg | INTRAVENOUS | Status: DC | PRN
  Filled 2016-09-20: qty 2

## 2016-09-20 MED ORDER — FENTANYL CITRATE (PF) 100 MCG/2ML IJ SOLN: 50.0000 ug | INTRAMUSCULAR | Status: DC | PRN

## 2016-09-20 MED ORDER — TERBUTALINE SULFATE 1 MG/ML IJ SOLN: 0.2500 mg | Freq: Once | INTRAMUSCULAR | Status: DC | PRN

## 2016-09-20 MED ORDER — ACETAMINOPHEN 325 MG PO TABS
650.0000 mg | ORAL_TABLET | ORAL | Status: DC | PRN
  Filled 2016-09-20 (×2): qty 2

## 2016-09-20 MED ORDER — LACTATED RINGERS IV SOLN
INTRAVENOUS | Status: DC
  Administered 2016-09-21: 70 mL/h via INTRAVENOUS

## 2016-09-20 MED ORDER — LIDOCAINE HCL (PF) 1 % IJ SOLN
INTRAMUSCULAR | Status: DC | PRN
  Administered 2016-09-20: 4 mL
  Administered 2016-09-20: 6 mL via EPIDURAL

## 2016-09-20 MED ORDER — OXYCODONE-ACETAMINOPHEN 5-325 MG PO TABS: 1.0000 | ORAL_TABLET | ORAL | Status: DC | PRN

## 2016-09-20 MED ORDER — PENICILLIN G POT IN DEXTROSE 60000 UNIT/ML IV SOLN
3.0000 10*6.[IU] | INTRAVENOUS | Status: DC
  Administered 2016-09-20 (×3): 3 10*6.[IU] via INTRAVENOUS
  Filled 2016-09-20 (×7): qty 50

## 2016-09-20 MED ORDER — MAGNESIUM SULFATE 40 G IN LACTATED RINGERS - SIMPLE
2.0000 g/h | INTRAVENOUS | Status: DC
  Administered 2016-09-21: 2 g/h via INTRAVENOUS
  Filled 2016-09-20: qty 40
  Filled 2016-09-20: qty 500

## 2016-09-20 MED ORDER — LACTATED RINGERS IV SOLN: 500.0000 mL | INTRAVENOUS | Status: DC | PRN

## 2016-09-20 MED ORDER — ONDANSETRON HCL 4 MG/2ML IJ SOLN: 4.0000 mg | Freq: Four times a day (QID) | INTRAMUSCULAR | Status: DC | PRN

## 2016-09-20 MED ORDER — OXYCODONE-ACETAMINOPHEN 5-325 MG PO TABS: 2.0000 | ORAL_TABLET | ORAL | Status: DC | PRN

## 2016-09-20 MED ORDER — FLEET ENEMA 7-19 GM/118ML RE ENEM: 1.0000 | ENEMA | Freq: Every day | RECTAL | Status: DC | PRN

## 2016-09-20 MED ORDER — SOD CITRATE-CITRIC ACID 500-334 MG/5ML PO SOLN
30.0000 mL | Freq: Once | ORAL | Status: AC
  Administered 2016-09-20: 30 mL via ORAL
  Filled 2016-09-20: qty 15

## 2016-09-20 MED ORDER — LIDOCAINE HCL (PF) 1 % IJ SOLN
30.0000 mL | INTRAMUSCULAR | Status: DC | PRN
  Filled 2016-09-20: qty 30

## 2016-09-20 NOTE — Anesthesia Preprocedure Evaluation (Signed)
Anesthesia Evaluation  Patient identified by MRN, date of birth, ID band Patient awake    Reviewed: Allergy & Precautions, H&P , Patient's Chart, lab work & pertinent test results, reviewed documented beta blocker date and time   Airway Mallampati: II  TM Distance: >3 FB Neck ROM: full    Dental no notable dental hx.    Pulmonary    Pulmonary exam normal breath sounds clear to auscultation       Cardiovascular hypertension,  Rhythm:regular Rate:Normal     Neuro/Psych    GI/Hepatic   Endo/Other    Renal/GU      Musculoskeletal   Abdominal   Peds  Hematology   Anesthesia Other Findings Plts 112; PE( HTN)  Reproductive/Obstetrics                             Anesthesia Physical Anesthesia Plan  ASA: II  Anesthesia Plan: Epidural   Post-op Pain Management:    Induction:   PONV Risk Score and Plan:   Airway Management Planned:   Additional Equipment:   Intra-op Plan:   Post-operative Plan:   Informed Consent: I have reviewed the patients History and Physical, chart, labs and discussed the procedure including the risks, benefits and alternatives for the proposed anesthesia with the patient or authorized representative who has indicated his/her understanding and acceptance.   Dental Advisory Given  Plan Discussed with: CRNA and Surgeon  Anesthesia Plan Comments: (Labs checked- platelets confirmed with RN in room. Fetal heart tracing, per RN, reported to be stable enough for sitting procedure. Discussed epidural, and patient consents to the procedure:  included risk of possible headache,backache, failed block, allergic reaction, and nerve injury. This patient was asked if she had any questions or concerns before the procedure started.)        Anesthesia Quick Evaluation

## 2016-09-20 NOTE — Anesthesia Pain Management Evaluation Note (Signed)
  CRNA Pain Management Visit Note  Patient: Margaret Keller, 21 y.o., female  "Hello [facility]. We have an anesthesia team available at all times to provide care throughout the hospital, including epidural management and anesthesia for C-section. I don't know your plan for the delivery whether it a natural birth, water birth, IV sedation, nitrous supplementation, doula or epidural, but we want to meet your pain goals."   1.Was your pain managed to your expectations on prior hospitalizations?   Yes   2.What is your expectation for pain management during this hospitalization?     Epidural  3.How can we help you reach that goal? Epidural infusing, patient comfortable  Record the patient's initial score and the patient's pain goal.   Pain: 2  Pain Goal: 5 [facility] wants you to be able to say your pain was always managed very well.  [facility] Hospital[facility]ERRITT,Margaret Keller 09/20/2016

## 2016-09-20 NOTE — Progress Notes (Signed)
Labor Progress Note Margaret Keller is a 21 y.o. G4P0301 at 6344w0d presented for severe pre-eclampsia with HA, hyperreflexia, elevated P:C 0.37.  S: Comfortable with FOB and daughter supportive at bedside.  O:  BP 107/63   Pulse (!) 103   Temp 97.9 F (36.6 C) (Axillary)   Resp 18   Ht 5\' 4"  (1.626 m)   Wt 73.9 kg (163 lb)   LMP 01/12/2016 (Exact Date)   SpO2 98%   BMI 27.98 kg/m   1110 CVE: Dilation: 3 Effacement (%): 80 Cervical Position: Posterior Station: -2 Presentation: Vertex Exam by:: Devra DoppAmber Knox, RN    A&P: 21 y.o. [phone] 7144w0d IOL for severe pre-eclampsia. #Pre-eclampsia with severe features (persistent HA, P:C 0.37): Bps moderate range initially then dropped during epidural requiring phenylephrine - last BP 117/65. Continue magnesium (started 1000 9/10). #Labor: continue pitocin per protocol, recheck in 4-6 hours from last. #Pain: comfortable with epidural #FWB: category 1, overall reassuring. s/p BMZ 9/7 and 9/8.  #GBS unknown - PCN given prematurity  Seen and discussed with Dr. Doroteo GlassmanPhelps.  Burnard LeighJenna Terika Pillard, MD 1:10 PM

## 2016-09-20 NOTE — Progress Notes (Signed)
   Margaret Keller is a 21 y.o. [id number] at 42106w0d  admitted for induction of labor due to preeclampsia with severe features.  Subjective: Comfortable with epidural, no HA, floaters.   Objective: Vitals:   09/20/16 1931 09/20/16 2001 09/20/16 2031 09/20/16 2101  BP: 115/62 (!) 112/53 (!) 101/57 114/66  Pulse: (!) 101 100 (!) 105 100  Resp: 18 20 18 20   Temp:      TempSrc:      SpO2:      Weight:      Height:       Total I/O In: 300 [P.O.:300] Out: -   FHT:  FHR: 130 bpm, variability: moderate,  accelerations:  Present,  decelerations:  Absent UC:   irregular, every 2-5 minutes SVE:   Dilation: 3.5 Effacement (%): 80 Station: -3, -2 Exam by:: Margaret DoppAmber Knox, RN and Dr. Stevie Keller Pitocin @ 22 mu/min  Labs: Lab Results  Component Value Date   WBC 15.1 (H) 09/20/2016   HGB 11.5 (L) 09/20/2016   HCT 33.8 (L) 09/20/2016   MCV 84.5 09/20/2016   PLT 112 (L) 09/20/2016    Assessment / Plan: IOL, progressing slowly AROM clear fluid at 2135 Labor: progressing slowly Fetal Wellbeing:  Category I Pain Control:  Epidural Anticipated MOD:  NSVD  Margaret GaribaldiKathryn Lorraine Ronzell Keller 09/20/2016, 9:38 PM

## 2016-09-20 NOTE — H&P (Signed)
Obstetric History and Physical  Margaret Keller is a 21 y.o. [phone] with IUP at 4848w0d presenting with worsening and persistent headaches not responsive to any analgesia for a few days, in the setting of known GHTN and history of preeclampsia.  Denies visual symptoms, RUQ/epigastric pain but does report heartburn.  Patient also states she has been having  regular, every 3-4 minutes contractions that started yesterday around 1 pm, no vaginal bleeding, intact membranes, with active fetal movement.  She was schedule for office appointment and ultrasound today, but came here to MAU.  Was also scheduled for in office cerclage removal today.    [facility] Pregnancy complications or risks: Patient Active Problem List   Diagnosis Date Noted  . Severe preeclampsia, third trimester 09/20/2016  . Gestational thrombocytopenia (HCC) 07/29/2016  . Gestational hypertension 07/26/2016  . Headache in pregnancy 07/26/2016  . Anxiety 06/09/2016  . Cervical cerclage suture present- placed 05/13/2016, removed 09/20/2016 05/23/2016  . Late prenatal care affecting pregnancy, antepartum 05/23/2016  . Supervision of high risk pregnancy, antepartum 05/12/2016  . History of pre-eclampsia in prior pregnancy, currently pregnant 05/11/2016  . History of preterm delivery, currently pregnant 11/07/2014  . Cervical insufficiency during pregnancy, antepartum 11/07/2014    Clinic [facility] Prenatal Labs  Dating LMP + 17 wks u/s Blood type:   O pos  Genetic Screen  Quad:   WNL Antibody: neg  Anatomic US WNL Rubella: 1.99 (05/02 1052)  GTT  Third trimester: neg RPR: Non Reactive (05/02 1052)   Flu vaccine Declined HBsAg: Negative (05/02 1052)   TDaP vaccine   07/26/2016                                         HIV: Non Reactive (05/02 1052)   Baby Food  Breastfeeding                                          GBS: Unknown  Contraception  Nexplanon Pap:11/07/14 normal  Circumcision Yes inpatient    Pediatrician  [facility]   Support Person Geryl RankinsDashawn (husband)     Past Medical History:  Diagnosis Date  . Cervical insufficiency during pregnancy, antepartum   . History of cervical cerclage   . Preterm labor   . SVD (spontaneous vaginal delivery) 03/26/2015   x 1    Past Surgical History:  Procedure Laterality Date  . CERVICAL CERCLAGE N/A 11/19/2014   Procedure: CERCLAGE CERVICALw/spinal;  Surgeon: Tereso NewcomerUgonna A Anyanwu, MD;  Location: [facility];  Service: Gynecology;  Laterality: N/A;  . CERVICAL CERCLAGE N/A 05/13/2016   Procedure: CERCLAGE CERVICAL;  Surgeon: Tilda BurrowJohn Ferguson V, MD;  Location: [facility];  Service: Gynecology;  Laterality: N/A;    OB History  Gravida Para Term Preterm AB Living  4 3   3   1   SAB TAB Ectopic Multiple Live Births        0 2    # Outcome Date GA Lbr Len/2nd Weight Sex Delivery Anes PTL Lv  4 Current           3 Preterm 03/26/15 4929w2d  6 lb 7.2 oz (2.925 kg) F Vag-Spont EPI  LIV  2 Preterm 05/31/14 6680w0d 08:50 / 01:42 1 lb 2.4 oz (0.522 kg) M Vag-Spont None  ND     Birth Comments: 22 weeks. Eyes fused.  1 Preterm 10/23/13 [redacted]w[redacted]d 04:31 / 00:02 12.2 oz (0.346 kg) M Vag-Spont None  FD      Social History   Social History  . Marital status: Legally Separated    Spouse name: N/A  . Number of children: N/A  . Years of education: N/A   Social History Main Topics  . Smoking status: Never Smoker  . Smokeless tobacco: Never Used  . Alcohol use No  . Drug use: No  . Sexual activity: Yes    Birth control/ protection: None     Comment: Approx [redacted]wks gestation   Other Topics Concern  . None   Social History Narrative  . None    Family History  Problem Relation Age of Onset  . Hypertension Mother   . Diabetes Father     Facility-Administered Medications Prior to Admission  Medication Dose Route Frequency Provider Last Rate Last Dose  . betamethasone acetate-betamethasone sodium phosphate (CELESTONE) injection 12 mg  12 mg Intramuscular  Once Degele, Kandra Nicolas, MD      . HYDROXYprogesterone Caproate SOAJ 275 mg  275 mg Subcutaneous Once Winger Bing, MD       Prescriptions Prior to Admission  Medication Sig Dispense Refill Last Dose  . aspirin EC 81 MG tablet Take 81 mg by mouth daily.   09/19/2016 at Unknown time  . aspirin-acetaminophen-caffeine (EXCEDRIN MIGRAINE) 250-250-65 MG tablet Take 2 tablets by mouth every 6 (six) hours as needed for headache or migraine.   Past Week at Unknown time  . Prenatal Vit-Fe Fumarate-FA (PRENATAL MULTIVITAMIN) TABS tablet Take 1 tablet by mouth daily.    09/19/2016 at Unknown time    No Known Allergies  Review of Systems: Negative except for what is mentioned in HPI.  Physical Exam: BP 131/77   Pulse (!) 104   Temp 98.5 F (36.9 C) (Oral)   Resp 18   Ht  (1.626 m)   Wt 163 lb (73.9 kg)   LMP 01/12/2016 (Exact Date)   BMI 27.98 kg/m  CONSTITUTIONAL: Well-developed, well-nourished female in no acute distress.  HENT:  Normocephalic, atraumatic, External right and left ear normal. Oropharynx is clear and moist EYES: Conjunctivae and EOM are normal. Pupils are equal, round, and reactive to light. No scleral icterus.  NECK: Normal range of motion, supple, no masses SKIN: Skin is warm and dry. No rash noted. Not diaphoretic. No erythema. No pallor. NEUROLOGIC: Alert and oriented to person, place, and time.  No cranial nerve deficit noted. PSYCHIATRIC: Normal mood and affect. Normal behavior. Normal judgment and thought content. CARDIOVASCULAR: Normal heart rate noted, regular rhythm RESPIRATORY: Effort and breath sounds normal, no problems with respiration noted ABDOMEN: Soft, nontender, nondistended, gravid. MUSCULOSKELETAL: Normal range of motion. No edema and no tenderness. Brisk 3+ reflexes, no clonus, muscle tone coordination  FHT:  Baseline rate 145 bpm   Variability moderate  Accelerations present   Decelerations none Contractions: Every 1-3 mins Presentation: cephalic  verified on bedside ultrasound  Cerclage removal:  After fetal presentation was verified on ultrasound, a vaginal speculum was placed. Anterior knot of cervical cerclage was recognized and pulled upwards to visualize both sides of the circumferential suture under the knot. One side was cut and the remaining suture was pulled and removed intact. There was some bleeding noted.  Speculum was removed, and cervix was checked and found to be 3/70/-2.   Pertinent Labs/Studies:   Results for orders placed or  performed during the hospital encounter of 09/20/16 (from the past 24 hour(s))  Urinalysis, Routine w reflex microscopic     Status: Abnormal   Collection Time: 09/20/16  6:40 AM  Result Value Ref Range   Color, Urine STRAW (A) YELLOW   APPearance CLEAR CLEAR   Specific Gravity, Urine 1.005 1.005 - 1.030   pH 7.0 5.0 - 8.0   Glucose, UA NEGATIVE NEGATIVE mg/dL   Hgb urine dipstick SMALL (A) NEGATIVE   Bilirubin Urine NEGATIVE NEGATIVE   Ketones, ur NEGATIVE NEGATIVE mg/dL   Protein, ur NEGATIVE NEGATIVE mg/dL   Nitrite NEGATIVE NEGATIVE   Leukocytes, UA MODERATE (A) NEGATIVE   RBC / HPF 0-5 0 - 5 RBC/hpf   WBC, UA 0-5 0 - 5 WBC/hpf   Bacteria, UA RARE (A) NONE SEEN   Squamous Epithelial / LPF 0-5 (A) NONE SEEN   Mucus PRESENT    Sperm, UA PRESENT   Protein / creatinine ratio, urine     Status: Abnormal   Collection Time: 09/20/16  6:40 AM  Result Value Ref Range   Creatinine, Urine 49.00 mg/dL   Total Protein, Urine 18 mg/dL   Protein Creatinine Ratio 0.37 (H) 0.00 - 0.15 mg/mg[Cre]  CBC     Status: Abnormal   Collection Time: 09/20/16  7:31 AM  Result Value Ref Range   WBC 15.1 (H) 4.0 - 10.5 K/uL   RBC 4.00 3.87 - 5.11 MIL/uL   Hemoglobin 11.5 (L) 12.0 - 15.0 g/dL   HCT 16.1 (L) 09.6 - 04.5 %   MCV 84.5 78.0 - 100.0 fL   MCH 28.8 26.0 - 34.0 pg   MCHC 34.0 30.0 - 36.0 g/dL   RDW 40.9 81.1 - 91.4 %   Platelets 112 (L) 150 - 400 K/uL  Comprehensive metabolic panel     Status:  Abnormal   Collection Time: 09/20/16  7:31 AM  Result Value Ref Range   Sodium 136 135 - 145 mmol/L   Potassium 3.7 3.5 - 5.1 mmol/L   Chloride 108 101 - 111 mmol/L   CO2 20 (L) 22 - 32 mmol/L   Glucose, Bld 108 (H) 65 - 99 mg/dL   BUN 5 (L) 6 - 20 mg/dL   Creatinine, Ser 7.82 0.44 - 1.00 mg/dL   Calcium 8.5 (L) 8.9 - 10.3 mg/dL   Total Protein 6.6 6.5 - 8.1 g/dL   Albumin 3.1 (L) 3.5 - 5.0 g/dL   AST 24 15 - 41 U/L   ALT 15 14 - 54 U/L   Alkaline Phosphatase 125 38 - 126 U/L   Total Bilirubin 0.5 0.3 - 1.2 mg/dL   GFR calc non Af Amer >60 >60 mL/min   GFR calc Af Amer >60 >60 mL/min   Anion gap 8 5 - 15    Assessment : Margaret Keller is a 21 y.o. [id number] at [redacted]w[redacted]d being admitted for induction of labor due to severe preeclampsia by persistent headache criterion. P:C 0.37, platelets 112K.    Plan: Labor: Induction as ordered as per protocol. Analgesia as needed. Severe preeclampsia:  Will monitor BP, start magnesium sulfate for eclampsia prophylaxis.  Continue close monitoring.   FWB: Reassuring fetal heart tracing.  GBS unknown; will treat with PCN given prematurity. Delivery plan: Hopeful for vaginal delivery   Jaynie Collins, MD, FACOG Attending Obstetrician & Gynecologist Faculty Practice, [facility]

## 2016-09-20 NOTE — Anesthesia Procedure Notes (Signed)
Epidural Patient location during procedure: OB  Staffing Anesthesiologist: Chun Sellen  Preanesthetic Checklist Completed: patient identified, pre-op evaluation, timeout performed, IV checked, risks and benefits discussed and monitors and equipment checked  Epidural Patient position: sitting Prep: DuraPrep Patient monitoring: blood pressure and continuous pulse ox Approach: right paramedian Location: L3-L4 Injection technique: LOR air  Needle:  Needle type: Tuohy  Needle gauge: 17 G Needle insertion depth: 5 cm Catheter type: closed end flexible Catheter size: 19 Gauge Catheter at skin depth: 10 cm Test dose: negative  Assessment Sensory level: T8  Additional Notes    Dosing of Epidural:  1st dose, through catheter .............................................  Xylocaine 40 mg  2nd dose, through catheter, after waiting 3 minutes.........Xylocaine 60 mg    As each dose occurred, patient was free of IV sx; and patient exhibited no evidence of SA injection.  Patient is more comfortable after epidural dosed. Please see RN's note for documentation of vital signs,and FHR which are stable.  Patient reminded not to try to ambulate with numb legs, and that an RN must be present when she attempts to get up.         

## 2016-09-20 NOTE — Progress Notes (Signed)
Patient ID: Margaret Keller, female   DOB: 03/30/1995, 21 y.o.   MRN: [id number]   Labor Progress Note Margaret HidesShadae T Potteiger is a 21 y.o. G4P0301 at 4127w0d presented for IOL for pre-eclampsia with severe features.  S: Comfortable with epidural. Partner and daughter supportive at bedside.  O:  BP (!) 104/51   Pulse 99   Temp (!) 97.5 F (36.4 C) (Axillary)   Resp 16   Ht 5\' 4"  (1.626 m)   Wt 73.9 kg (163 lb)   LMP 01/12/2016 (Exact Date)   SpO2 98%   BMI 27.98 kg/m   CVE: Dilation: 3.5 Effacement (%): 80 Cervical Position: Posterior Station: -3, -2 Presentation: Vertex Exam by:: Devra DoppAmber Knox, RN and Dr. Stevie Kerniggs   A&P: 21 y.o. [id number] 7127w0d IOL for pre-eclampsia with severe features (persistent HA, elevated P:C 0.37). #Pre-eclampsia with severe features (persistent HA, P:C 0.37): Bps moderate range initially then dropped during epidural requiring phenylephrine - remain normal for now. Continue magnesium (started 1000 9/10). #Labor: Progressing slowly with pitocin, continue current management. #Pain: comfortable with epidural #FWB: category 1, overall reassuring. s/p BMZ 9/7 and 9/8.  #GBS unknown - PCN given prematurity  Discussed with Dr. Doroteo GlassmanPhelps.   Burnard LeighJenna Xiao Graul, MD 4:30 PM

## 2016-09-20 NOTE — MAU Note (Signed)
Pt presents to MAU with contractions that started yesterday around 1 pm. No vaginal bleeding/discharge. No LOF. +FM

## 2016-09-20 NOTE — MAU Provider Note (Signed)
Chief Complaint:  Contractions   First Provider Initiated Contact with Patient 09/20/16 [phone]     HPI: Margaret Keller is a 21 y.o. G4P0301 at 3836w0dwho presents to maternity admissions reporting painful uterine contractions since yesterday.  Also has a headache but does not want treatment for it. . She reports good fetal movement, denies LOF, vaginal bleeding, vaginal itching/burning, urinary symptoms, dizziness, n/v, diarrhea, constipation or fever/chills.  She denies visual changes or RUQ abdominal pain.  Has known gestational hypertension and is scheduled for IOL next week Scheduled to have cerclage removed today (hx incompetent cervix with losses)  Abdominal Pain  This is a new problem. The current episode started yesterday. The onset quality is gradual. The problem occurs intermittently. The problem has been unchanged. The pain is located in the suprapubic region, LLQ and RLQ. The pain is moderate. The quality of the pain is cramping. The abdominal pain does not radiate. Associated symptoms include headaches. Pertinent negatives include no constipation, diarrhea, dysuria, myalgias, nausea or vomiting. Nothing aggravates the pain. The pain is relieved by nothing. She has tried nothing for the symptoms.    RN Note: Pt presents to MAU with contractions that started yesterday around 1 pm. No vaginal bleeding/discharge. No LOF. +FM   Past Medical History: Past Medical History:  Diagnosis Date  . Cervical insufficiency during pregnancy, antepartum   . History of cervical cerclage   . Preterm labor   . SVD (spontaneous vaginal delivery) 03/26/2015   x 1    Past obstetric history: OB History  Gravida Para Term Preterm AB Living  4 3   3   1   SAB TAB Ectopic Multiple Live Births        0 2    # Outcome Date GA Lbr Len/2nd Weight Sex Delivery Anes PTL Lv  4 Current           3 Preterm 03/26/15 7042w2d  6 lb 7.2 oz (2.925 kg) F Vag-Spont EPI  LIV  2 Preterm 05/31/14 6423w0d 08:50 / 01:42  1 lb 2.4 oz (0.522 kg) M Vag-Spont None  ND     Birth Comments: 22 weeks. Eyes fused.  1 Preterm 10/23/13 2435w1d 04:31 / 00:02 12.2 oz (0.346 kg) M Vag-Spont None  FD      Past Surgical History: Past Surgical History:  Procedure Laterality Date  . CERVICAL CERCLAGE N/A 11/19/2014   Procedure: CERCLAGE CERVICALw/spinal;  Surgeon: Tereso NewcomerUgonna A Anyanwu, MD;  Location: [facility];  Service: Gynecology;  Laterality: N/A;  . CERVICAL CERCLAGE N/A 05/13/2016   Procedure: CERCLAGE CERVICAL;  Surgeon: Tilda BurrowJohn Ferguson V, MD;  Location: [facility];  Service: Gynecology;  Laterality: N/A;    Family History: Family History  Problem Relation Age of Onset  . Hypertension Mother   . Diabetes Father     Social History: Social History  Substance Use Topics  . Smoking status: Never Smoker  . Smokeless tobacco: Never Used  . Alcohol use No    Allergies: No Known Allergies  Meds:  Facility-Administered Medications Prior to Admission  Medication Dose Route Frequency Provider Last Rate Last Dose  . betamethasone acetate-betamethasone sodium phosphate (CELESTONE) injection 12 mg  12 mg Intramuscular Once Degele, Kandra NicolasJulie P, MD      . HYDROXYprogesterone Caproate SOAJ 275 mg  275 mg Subcutaneous Once [place] BingPickens, Charlie, MD       Prescriptions Prior to Admission  Medication Sig Dispense Refill Last Dose  . aspirin EC 81 MG tablet Take 81 mg by mouth daily.  09/18/2016 at Unknown time  . aspirin-acetaminophen-caffeine (EXCEDRIN MIGRAINE) 250-250-65 MG tablet Take 2 tablets by mouth every 6 (six) hours as needed for headache or migraine.   09/18/2016 at Unknown time  . cyclobenzaprine (FLEXERIL) 5 MG tablet Take 1 tablet (5 mg total) by mouth 3 (three) times daily as needed for muscle spasms. (Patient not taking: Reported on 09/09/2016) 15 tablet 0 Not Taking at Unknown time  . oxyCODONE-acetaminophen (PERCOCET/ROXICET) 5-325 MG tablet Take 1-2 tablets by mouth every 6 (six) hours as needed. (Patient not taking: Reported on  07/13/2016) 10 tablet 0 Not Taking at Unknown time  . Prenatal Vit-Fe Fumarate-FA (PRENATAL MULTIVITAMIN) TABS tablet Take 1 tablet by mouth daily.    09/18/2016 at Unknown time    I have reviewed patient's Past Medical Hx, Surgical Hx, Family Hx, Social Hx, medications and allergies.   ROS:  Review of Systems  Gastrointestinal: Positive for abdominal pain. Negative for constipation, diarrhea, nausea and vomiting.  Genitourinary: Negative for dysuria.  Musculoskeletal: Negative for myalgias.  Neurological: Positive for headaches.   Other systems negative  Physical Exam  Patient Vitals for the past 24 hrs:  BP Temp Temp src Pulse Resp  09/20/16 0700 (!) 143/90 - - (!) 103 -  09/20/16 0646 140/90 98.2 F (36.8 C) Oral (!) 107 18   Vitals:   09/20/16 0646 09/20/16 0700  BP: 140/90 (!) 143/90  Pulse: (!) 107 (!) 103  Resp: 18   Temp: 98.2 F (36.8 C)   TempSrc: Oral     Constitutional: Well-developed, well-nourished female in no acute distress.  Cardiovascular: normal rate and rhythm Respiratory: normal effort, clear to auscultation bilaterally GI: Abd soft, non-tender, gravid appropriate for gestational age.   No rebound or guarding. MS: Extremities nontender, no edema, normal ROM Neurologic: Alert and oriented x 4.  DTR 2+ with no clonus GU: Neg CVAT.  PELVIC EXAM:   Dilation: Closed Effacement (%): 80 Exam by:: Artelia Laroche CNM Cerclage palpable  FHT:  Baseline 140 , moderate variability, accelerations present, no decelerations Contractions: q 1-4 mins Irregular     Labs: O/Positive/-- (07/16 1054) Preeclampsia labs pending  Imaging:  No results found.  MAU Course/MDM: I have ordered labs   Assessment:  Plan: Report given to oncoming provider.  Discussed with OB Fellow to have pt discussed in rounds Plan to follow  Wynelle Bourgeois CNM, MSN Certified Nurse-Midwife 09/20/2016 7:05 AM  0820: Care assumed by Dr. Nira Retort and Dr. Macon Large See H&P for further  details  Donette Larry, CNM  09/20/2016 10:15 AM

## 2016-09-21 ENCOUNTER — Encounter (HOSPITAL_COMMUNITY): Payer: Self-pay

## 2016-09-21 DIAGNOSIS — O1414 Severe pre-eclampsia complicating childbirth: Secondary | ICD-10-CM

## 2016-09-21 DIAGNOSIS — Z3A36 36 weeks gestation of pregnancy: Secondary | ICD-10-CM

## 2016-09-21 LAB — CBC
HCT: 34.3 % — ABNORMAL LOW (ref 36.0–46.0)
HEMOGLOBIN: 11.7 g/dL — AB (ref 12.0–15.0)
MCH: 28.9 pg (ref 26.0–34.0)
MCHC: 34.1 g/dL (ref 30.0–36.0)
MCV: 84.7 fL (ref 78.0–100.0)
Platelets: 105 10*3/uL — ABNORMAL LOW (ref 150–400)
RBC: 4.05 MIL/uL (ref 3.87–5.11)
RDW: 13.8 % (ref 11.5–15.5)
WBC: 15.2 10*3/uL — ABNORMAL HIGH (ref 4.0–10.5)

## 2016-09-21 LAB — RPR: RPR: NONREACTIVE

## 2016-09-21 MED ORDER — TETANUS-DIPHTH-ACELL PERTUSSIS 5-2.5-18.5 LF-MCG/0.5 IM SUSP: 0.5000 mL | Freq: Once | INTRAMUSCULAR | Status: DC

## 2016-09-21 MED ORDER — ONDANSETRON HCL 4 MG PO TABS: 4.0000 mg | ORAL_TABLET | ORAL | Status: DC | PRN

## 2016-09-21 MED ORDER — DIPHENHYDRAMINE HCL 25 MG PO CAPS: 25.0000 mg | ORAL_CAPSULE | Freq: Four times a day (QID) | ORAL | Status: DC | PRN

## 2016-09-21 MED ORDER — SENNOSIDES-DOCUSATE SODIUM 8.6-50 MG PO TABS
2.0000 | ORAL_TABLET | ORAL | Status: DC
  Administered 2016-09-21 – 2016-09-22 (×2): 2 via ORAL
  Filled 2016-09-21 (×2): qty 2

## 2016-09-21 MED ORDER — ZOLPIDEM TARTRATE 5 MG PO TABS: 5.0000 mg | ORAL_TABLET | Freq: Every evening | ORAL | Status: DC | PRN

## 2016-09-21 MED ORDER — WITCH HAZEL-GLYCERIN EX PADS: 1.0000 "application " | MEDICATED_PAD | CUTANEOUS | Status: DC | PRN

## 2016-09-21 MED ORDER — ONDANSETRON HCL 4 MG/2ML IJ SOLN: 4.0000 mg | INTRAMUSCULAR | Status: DC | PRN

## 2016-09-21 MED ORDER — LIDOCAINE HCL 1 % IJ SOLN
INTRAMUSCULAR | Status: AC
  Filled 2016-09-21: qty 20

## 2016-09-21 MED ORDER — ACETAMINOPHEN 325 MG PO TABS
650.0000 mg | ORAL_TABLET | ORAL | Status: DC | PRN
  Administered 2016-09-21 – 2016-09-22 (×4): 650 mg via ORAL
  Filled 2016-09-21 (×2): qty 2

## 2016-09-21 MED ORDER — COCONUT OIL OIL: 1.0000 "application " | TOPICAL_OIL | Status: DC | PRN

## 2016-09-21 MED ORDER — PRENATAL MULTIVITAMIN CH
1.0000 | ORAL_TABLET | Freq: Every day | ORAL | Status: DC
  Administered 2016-09-21 – 2016-09-22 (×2): 1 via ORAL
  Filled 2016-09-21 (×2): qty 1

## 2016-09-21 MED ORDER — SIMETHICONE 80 MG PO CHEW: 80.0000 mg | CHEWABLE_TABLET | ORAL | Status: DC | PRN

## 2016-09-21 MED ORDER — DIBUCAINE 1 % RE OINT: 1.0000 "application " | TOPICAL_OINTMENT | RECTAL | Status: DC | PRN

## 2016-09-21 MED ORDER — BENZOCAINE-MENTHOL 20-0.5 % EX AERO
1.0000 "application " | INHALATION_SPRAY | CUTANEOUS | Status: DC | PRN
  Administered 2016-09-21: 1 via TOPICAL
  Filled 2016-09-21: qty 56

## 2016-09-21 MED ORDER — MAGNESIUM SULFATE 40 G IN LACTATED RINGERS - SIMPLE
2.0000 g/h | INTRAVENOUS | Status: DC
  Filled 2016-09-21: qty 500

## 2016-09-21 MED ORDER — IBUPROFEN 600 MG PO TABS
600.0000 mg | ORAL_TABLET | Freq: Four times a day (QID) | ORAL | Status: DC
  Administered 2016-09-21 – 2016-09-23 (×9): 600 mg via ORAL
  Filled 2016-09-21 (×9): qty 1

## 2016-09-21 NOTE — Lactation Note (Signed)
This note was copied from a baby's chart. Lactation Consultation Note  Patient Name: Margaret Keller NWGNF'AToday's Date: 09/21/2016 Reason for consult: Initial assessment;Late-preterm 34-36.6wks   Initial consult at 14 hrs old; LPTI GA 36.1; BW 6 lbs, 4.7 oz; 1% weight loss.  Mom P2; on Mag.   Infant has breastfed x2 (30 min) + attempt x1 (0 min) + formula x2 (10 ml) + EBM x1 (10 ml); voids-2; stools-0; LS-8 by RN.   Infant STS with mom when LC entered room.  Mom stated she was very tired, so mom took infant off chest, swaddled, and LC laid in crib. Mom stated infant is BF well and states feel tug with breastfeeding. Reviewed LPTI Green sheet with mom and encouraged breastfeeding first for 15-20 minutes and then offer supplementation of EBM or formula amount based on guidelines.   Encouraged to pump after each breastfeeding for the next feeding's supplementation.   Educated to feed with feeding cues and at least every 2.5-3 hrs waking infant as needed for feeding. Discussed LPTI behaviors and the need to awaken for feedings.   Mom very receptive to teaching. Encouraged mom to call for assistance as needed especially since mom does not have family staying to help with baby.     Maternal Data Has patient been taught Hand Expression?: Yes  Feeding    LATCH Score       Type of Nipple: Everted at rest and after stimulation  Comfort (Breast/Nipple): Soft / non-tender        Interventions    Lactation Tools Discussed/Used WIC Program: Yes Pump Review: Setup, frequency, and cleaning   Consult Status Consult Status: Follow-up    Lendon KaVann, [name] 09/21/2016, 4:39 PM

## 2016-09-21 NOTE — Anesthesia Postprocedure Evaluation (Signed)
Anesthesia Post Note  Patient: Margaret Keller  Procedure(s) Performed: * No procedures listed *     Patient location during evaluation: Mother Baby Anesthesia Type: Epidural Level of consciousness: awake and alert and oriented Pain management: satisfactory to patient Vital Signs Assessment: post-procedure vital signs reviewed and stable Respiratory status: spontaneous breathing and nonlabored ventilation Cardiovascular status: stable Postop Assessment: no headache, no backache, no signs of nausea or vomiting, adequate PO intake and patient able to bend at knees (patient up walking) Anesthetic complications: no    Last Vitals:  Vitals:   09/21/16 0700 09/21/16 0802  BP: (!) 103/57 (!) 102/59  Pulse: 91 89  Resp: 15 16  Temp:  36.8 C  SpO2: 100% 100%    Last Pain:  Vitals:   09/21/16 0802  TempSrc: Oral  PainSc:    Pain Goal: Patients Stated Pain Goal: 3 (09/21/16 0800)               Madison HickmanGREGORY,Latecia Miler

## 2016-09-21 NOTE — Anesthesia Postprocedure Evaluation (Signed)
Anesthesia Post Note  Patient: Margaret Keller  Procedure(s) Performed: * No procedures listed *     Anesthesia Post Evaluation  Last Vitals:  Vitals:   09/21/16 0700 09/21/16 0802  BP: (!) 103/57 (!) 102/59  Pulse: 91 89  Resp: 15 16  Temp:  36.8 C  SpO2: 100% 100%    Last Pain:  Vitals:   09/21/16 0802  TempSrc: Oral  PainSc:    Pain Goal: Patients Stated Pain Goal: 3 (09/21/16 0800)               Madison HickmanGREGORY,Nessie Nong

## 2016-09-22 NOTE — Progress Notes (Signed)
Post Partum Day 1 SVD/PEC Subjective: Pt without complaints this morning. No HA or visual problems. Ambulating and voiding without problems. Toerating diet. Pain controlled.  Objective: Blood pressure 122/62, pulse 90, temperature 98.5 F (36.9 C), temperature source Oral, resp. rate 17, height 5\' 4"  (1.626 m), weight 73.9 kg (163 lb), last menstrual period 01/12/2016, SpO2 99 %, unknown if currently breastfeeding.  Physical Exam:  General: alert Lochia: appropriate Uterine Fundus: firm Incision: healing well, DVT Evaluation: No evidence of DVT seen on physical exam.   Recent Labs  09/20/16 0731 09/21/16 0316  HGB 11.5* 11.7*  HCT 33.8* 34.3*    Assessment/Plan: PPD # 1 SVD PEC  Off magnesium. BO stable without meds. Continue with progressive care. Plan discharge tomorrow.   LOS: 2 days   Hermina StaggersMichael L Halden Phegley 09/22/2016, 8:30 AM

## 2016-09-22 NOTE — Progress Notes (Signed)
CSW received consult for hx of Anxiety and Depression.  CSW met with MOB to offer support and complete assessment.   MOB was pleasant and welcoming.  FOB was asleep in room and MOB gave permission to speak openly if he were to awaken while CSW was present.  MOB reports labor and delivery was fast and that it went well.  She reports that this is her second child and that her father is caring for her 34 month old daughter while she is in the hospital.  She reports having a good support system.  MOB states she has felt slightly overwhelmed with the thought of two young children, but is confident that she can do this and is happy about her baby.  She reports that she and FOB are not currently in a relationship, but that he is involved and supportive and the father of both of her children.  MOB denies hx of Anxiety and Depression both in general and after the birth of her first child.  MOB did not mention her hx of loss/experience with grief and CSW did not feel it was appropriate to bring up this hx at this time.  MOB states no questions or concerns and was attentive while CSW provided education regarding the baby blues period vs. perinatal mood disorders.  CSW recommended self-evaluation during the postpartum time period using the New Mom Checklist from Postpartum Progress and or Edinburgh Postnatal Depression Scale and encouraged MOB to contact a medical professional if symptoms are noted at any time.   CSW provided review of Sudden Infant Death Syndrome (SIDS) precautions.   CSW identifies no further need for intervention and no barriers to discharge at this time.

## 2016-09-22 NOTE — Lactation Note (Signed)
This note was copied from a baby's chart. Lactation Consultation Note  Patient Name: Boy Maurine CaneShadae Jury ZOXWR'UToday's Date: 09/22/2016 Reason for consult: Follow-up assessment;Late-preterm 34-36.6wks;Infant < 6lbs  Baby 30 hours old. Mom holding baby STS. Mom denies any need for assistance with latching. Mom states that she is continuing to put baby to breast with cues, then supplement according to LPI guidelines, and then post-pump. Enc mom to follow supplementation amounts and give EBM first, then formula. [facility]. [facility] and [facility] [place] [facility]. Mom aware of OP/BFSG and LC phone line assistance after D/C.   Maternal Data Does the patient have breastfeeding experience prior to this delivery?: No  Feeding Feeding Type: Bottle Fed - Formula Nipple Type: Slow - flow  LATCH Score                   Interventions    Lactation Tools Discussed/Used     Consult Status Consult Status: Follow-up Date: 09/23/16 Follow-up type: In-patient    Sherlyn HayJennifer D Zeniyah Peaster 09/22/2016, 9:29 AM

## 2016-09-23 ENCOUNTER — Ambulatory Visit: Payer: Self-pay

## 2016-09-23 ENCOUNTER — Encounter: Payer: Self-pay | Admitting: Obstetrics and Gynecology

## 2016-09-23 MED ORDER — IBUPROFEN 600 MG PO TABS: 600.0000 mg | ORAL_TABLET | Freq: Four times a day (QID) | ORAL | 0 refills | Status: AC

## 2016-09-23 NOTE — Discharge Summary (Signed)
Obstetric Discharge Summary Reason for Admission: Severe PEC Prenatal Procedures: cerclage Intrapartum Procedures: spontaneous vaginal delivery and removal of cerclage Postpartum Procedures: magnesium therapy Complications-Operative and Postpartum: none Hemoglobin  Date Value Ref Range Status  09/21/2016 11.7 (L) 12.0 - 15.0 g/dL Final  16/10/960408/30/2018 54.011.5 11.1 - 15.9 g/dL Final   HCT  Date Value Ref Range Status  09/21/2016 34.3 (L) 36.0 - 46.0 % Final   Hematocrit  Date Value Ref Range Status  09/09/2016 33.6 (L) 34.0 - 46.6 % Final   Hospital course: Pt was admitted with SPEC at 36 weeks. H/O incompetent cervix, cerclage in place was removed. Unremarkable lab course. TSVD without problems. See delivery note. Received magnesium x 24 hours. BP remained stable and normatensive off magnesium. Progressed to ambulating, voiding, tolerating diet, breast feeding and good oral pain control. NO HA or visual changes. Felt amendable for discharge home on PPD # 2. Discharge instructions reviewed and pt to schedule follow up appt next week for BP check.  Physical Exam:  General: alert Lochia: appropriate Uterine Fundus: firm Incision: healing well DVT Evaluation: No evidence of DVT seen on physical exam.  Discharge Diagnoses: Term Pregnancy-delivered and Preelampsia  Discharge Information: Date: 09/23/2016 Activity: pelvic rest Diet: routine Medications: PNV and Ibuprofen Condition: stable Instructions: refer to practice specific booklet Discharge to: home [facility] Schedule an appointment as soon as possible for a visit in 1 week(s).   Why:  Nurse visit for BP check 4 weeks for Postpartum visit Contact information: [address] [place] [id number] [phone]          Newborn Data: Live born female  Birth Weight: 6 lb 4.7 oz (2855 g) APGAR: 9, 10  Home with mother.  Margaret StaggersMichael L Ervin 09/23/2016, 11:45 AM

## 2016-09-23 NOTE — Lactation Note (Signed)
This note was copied from a baby's chart. Lactation Consultation Note  Patient Name: Margaret Keller Today's Date: 09/23/2016   Saw Mom as she was being discharged with baby.  Mom received a WIC DEBP and states she understands importance of double pumping when baby eats.  Encouraged he to call Lactation with any concerns, as we would like to assist in any way.   Judee ClaraSmith, [name] 09/23/2016, 12:40 PM

## 2016-09-23 NOTE — Discharge Instructions (Signed)
Hypertension During Pregnancy °Hypertension, commonly called high blood pressure, is when the force of blood pumping through your arteries is too strong. Arteries are blood vessels that carry blood from the heart throughout the body. Hypertension during pregnancy can cause problems for you and your baby. Your baby may be born early (prematurely) or may not weigh as much as he or she should at birth. Very bad cases of hypertension during pregnancy can be life-threatening. °Different types of hypertension can occur during pregnancy. These include: °· Chronic hypertension. This happens when: °? You have hypertension before pregnancy and it continues during pregnancy. °? You develop hypertension before you are [redacted] weeks pregnant, and it continues during pregnancy. °· Gestational hypertension. This is hypertension that develops after the 20th week of pregnancy. °· Preeclampsia, also called toxemia of pregnancy. This is a very serious type of hypertension that develops only during pregnancy. It affects the whole body, and it can be very dangerous for you and your baby. ° °Gestational hypertension and preeclampsia usually go away within 6 weeks after your baby is born. Women who have hypertension during pregnancy have a greater chance of developing hypertension later in life or during future pregnancies. °What are the causes? °The exact cause of hypertension is not known. °What increases the risk? °There are certain factors that make it more likely for you to develop hypertension during pregnancy. These include: °· Having hypertension during a previous pregnancy or prior to pregnancy. °· Being overweight. °· Being older than age 40. °· Being pregnant for the first time or being pregnant with more than one baby. °· Becoming pregnant using fertilization methods such as IVF (in vitro fertilization). °· Having diabetes, kidney problems, or systemic lupus erythematosus. °· Having a family history of hypertension. ° °What are the  signs or symptoms? °Chronic hypertension and gestational hypertension rarely cause symptoms. Preeclampsia causes symptoms, which may include: °· Increased protein in your urine. Your health care provider will check for this at every visit before you give birth (prenatal visit). °· Severe headaches. °· Sudden weight gain. °· Swelling of the hands, face, legs, and feet. °· Nausea and vomiting. °· Vision problems, such as blurred or double vision. °· Numbness in the face, arms, legs, and feet. °· Dizziness. °· Slurred speech. °· Sensitivity to bright lights. °· Abdominal pain. °· Convulsions. ° °How is this diagnosed? °You may be diagnosed with hypertension during a routine prenatal exam. At each prenatal visit, you may: °· Have a urine test to check for high amounts of protein in your urine. °· Have your blood pressure checked. A blood pressure reading is recorded as two numbers, such as "120 over 80" (or 120/80). The first ("top") number is called the systolic pressure. It is a measure of the pressure in your arteries when your heart beats. The second ("bottom") number is called the diastolic pressure. It is a measure of the pressure in your arteries as your heart relaxes between beats. Blood pressure is measured in a unit called mm Hg. A normal blood pressure reading is: °? Systolic: below 120. °? Diastolic: below 80. ° °The type of hypertension that you are diagnosed with depends on your test results and when your symptoms developed. °· Chronic hypertension is usually diagnosed before 20 weeks of pregnancy. °· Gestational hypertension is usually diagnosed after 20 weeks of pregnancy. °· Hypertension with high amounts of protein in the urine is diagnosed as preeclampsia. °· Blood pressure measurements that stay above 160 systolic, or above 110 diastolic, are   signs of severe preeclampsia. ° °How is this treated? °Treatment for hypertension during pregnancy varies depending on the type of hypertension you have and how  serious it is. °· If you take medicines called ACE inhibitors to treat chronic hypertension, you may need to switch medicines. ACE inhibitors should not be taken during pregnancy. °· If you have gestational hypertension, you may need to take blood pressure medicine. °· If you are at risk for preeclampsia, your health care provider may recommend that you take a low-dose aspirin every day to prevent high blood pressure during your pregnancy. °· If you have severe preeclampsia, you may need to be hospitalized so you and your baby can be monitored closely. You may also need to take medicine (magnesium sulfate) to prevent seizures and to lower blood pressure. This medicine may be given as an injection or through an IV tube. °· In some cases, if your condition gets worse, you may need to deliver your baby early. ° °Follow these instructions at home: °Eating and drinking °· Drink enough fluid to keep your urine clear or pale yellow. °· Eat a healthy diet that is low in salt (sodium). Do not add salt to your food. Check food labels to see how much sodium a food or beverage contains. °Lifestyle °· Do not use any products that contain nicotine or tobacco, such as cigarettes and e-cigarettes. If you need help quitting, ask your health care provider. °· Do not use alcohol. °· Avoid caffeine. °· Avoid stress as much as possible. Rest and get plenty of sleep. °General instructions °· Take over-the-counter and prescription medicines only as told by your health care provider. °· While lying down, lie on your left side. This keeps pressure off your baby. °· While sitting or lying down, raise (elevate) your feet. Try putting some pillows under your lower legs. °· Exercise regularly. Ask your health care provider what kinds of exercise are best for you. °· Keep all prenatal and follow-up visits as told by your health care provider. This is important. °Contact a health care provider if: °· You have symptoms that your health care  provider told you may require more treatment or monitoring, such as: °? Fever. °? Vomiting. °? Headache. °Get help right away if: °· You have severe abdominal pain or vomiting that does not get better with treatment. °· You suddenly develop swelling in your hands, ankles, or face. °· You gain 4 lbs (1.8 kg) or more in 1 week. °· You develop vaginal bleeding, or you have blood in your urine. °· You do not feel your baby moving as much as usual. °· You have blurred or double vision. °· You have muscle twitching or sudden tightening (spasms). °· You have shortness of breath. °· Your lips or fingernails turn blue. °This information is not intended to replace advice given to you by your health care provider. Make sure you discuss any questions you have with your health care provider. °Document Released: 09/15/2010 Document Revised: 07/18/2015 Document Reviewed: 06/13/2015 °Elsevier Interactive Patient Education © 2018 Elsevier Inc. ° °

## 2016-09-27 ENCOUNTER — Inpatient Hospital Stay (HOSPITAL_COMMUNITY): Admission: RE | Admit: 2016-09-27 | Payer: Medicaid Other | Source: Ambulatory Visit | Admitting: Family Medicine

## 2016-09-30 ENCOUNTER — Encounter: Payer: Self-pay | Admitting: Family Medicine

## 2016-09-30 ENCOUNTER — Ambulatory Visit: Payer: Self-pay

## 2016-09-30 ENCOUNTER — Telehealth: Payer: Self-pay | Admitting: *Deleted

## 2016-09-30 NOTE — Telephone Encounter (Signed)
Margaret Keller missed an appt for bp check today. I called her and she states she forgot- agreed to come tomorrow 10am.

## 2016-10-01 ENCOUNTER — Ambulatory Visit: Payer: Self-pay

## 2016-10-07 ENCOUNTER — Encounter: Payer: Self-pay | Admitting: Family Medicine

## 2016-10-14 ENCOUNTER — Encounter: Payer: Self-pay | Admitting: Obstetrics & Gynecology

## 2016-10-21 ENCOUNTER — Encounter: Payer: Self-pay | Admitting: Obstetrics & Gynecology

## 2016-10-29 DIAGNOSIS — H16223 Keratoconjunctivitis sicca, not specified as Sjogren's, bilateral: Secondary | ICD-10-CM | POA: Diagnosis not present

## 2016-10-29 DIAGNOSIS — H40033 Anatomical narrow angle, bilateral: Secondary | ICD-10-CM | POA: Diagnosis not present

## 2016-11-02 ENCOUNTER — Encounter: Payer: Self-pay | Admitting: Family Medicine

## 2016-11-02 ENCOUNTER — Ambulatory Visit (INDEPENDENT_AMBULATORY_CARE_PROVIDER_SITE_OTHER): Payer: Medicaid Other | Admitting: Family Medicine

## 2016-11-02 VITALS — BP 127/58 | HR 84 | Wt 148.1 lb

## 2016-11-02 DIAGNOSIS — Z3049 Encounter for surveillance of other contraceptives: Secondary | ICD-10-CM | POA: Diagnosis not present

## 2016-11-02 DIAGNOSIS — Z3202 Encounter for pregnancy test, result negative: Secondary | ICD-10-CM | POA: Diagnosis not present

## 2016-11-02 DIAGNOSIS — Z1389 Encounter for screening for other disorder: Secondary | ICD-10-CM | POA: Diagnosis not present

## 2016-11-02 DIAGNOSIS — Z30017 Encounter for initial prescription of implantable subdermal contraceptive: Secondary | ICD-10-CM

## 2016-11-02 MED ORDER — ETONOGESTREL 68 MG ~~LOC~~ IMPL
68.0000 mg | DRUG_IMPLANT | Freq: Once | SUBCUTANEOUS | Status: AC
  Administered 2016-11-02: 68 mg via SUBCUTANEOUS

## 2016-11-02 MED ORDER — SERTRALINE HCL 25 MG PO TABS: 25.0000 mg | ORAL_TABLET | Freq: Every day | ORAL | 1 refills | Status: AC

## 2016-11-02 NOTE — Addendum Note (Signed)
Addended by: Garret ReddishBARNES, Ansley Mangiapane M on: 11/02/2016 11:18 AM   Modules accepted: Orders

## 2016-11-02 NOTE — Progress Notes (Signed)
error 

## 2016-11-02 NOTE — Patient Instructions (Signed)
Etonogestrel implant What is this medicine? ETONOGESTREL (et oh noe JES trel) is a contraceptive (birth control) device. It is used to prevent pregnancy. It can be used for up to 3 years. This medicine may be used for other purposes; ask your health care provider or pharmacist if you have questions. COMMON BRAND NAME(S): Implanon, Nexplanon What should I tell my health care provider before I take this medicine? They need to know if you have any of these conditions: -abnormal vaginal bleeding -blood vessel disease or blood clots -cancer of the breast, cervix, or liver -depression -diabetes -gallbladder disease -headaches -heart disease or recent heart attack -high blood pressure -high cholesterol -kidney disease -liver disease -renal disease -seizures -tobacco smoker -an unusual or allergic reaction to etonogestrel, other hormones, anesthetics or antiseptics, medicines, foods, dyes, or preservatives -pregnant or trying to get pregnant -breast-feeding How should I use this medicine? This device is inserted just under the skin on the inner side of your upper arm by a health care professional. Talk to your pediatrician regarding the use of this medicine in children. Special care may be needed. Overdosage: If you think you have taken too much of this medicine contact a poison control center or emergency room at once. NOTE: This medicine is only for you. Do not share this medicine with others. What if I miss a dose? This does not apply. What may interact with this medicine? Do not take this medicine with any of the following medications: -amprenavir -bosentan -fosamprenavir This medicine may also interact with the following medications: -barbiturate medicines for inducing sleep or treating seizures -certain medicines for fungal infections like ketoconazole and itraconazole -grapefruit juice -griseofulvin -medicines to treat seizures like carbamazepine, felbamate, oxcarbazepine,  phenytoin, topiramate -modafinil -phenylbutazone -rifampin -rufinamide -some medicines to treat HIV infection like atazanavir, indinavir, lopinavir, nelfinavir, tipranavir, ritonavir -St. John's wort This list may not describe all possible interactions. Give your health care provider a list of all the medicines, herbs, non-prescription drugs, or dietary supplements you use. Also tell them if you smoke, drink alcohol, or use illegal drugs. Some items may interact with your medicine. What should I watch for while using this medicine? This product does not protect you against HIV infection (AIDS) or other sexually transmitted diseases. You should be able to feel the implant by pressing your fingertips over the skin where it was inserted. Contact your doctor if you cannot feel the implant, and use a non-hormonal birth control method (such as condoms) until your doctor confirms that the implant is in place. If you feel that the implant may have broken or become bent while in your arm, contact your healthcare provider. What side effects may I notice from receiving this medicine? Side effects that you should report to your doctor or health care professional as soon as possible: -allergic reactions like skin rash, itching or hives, swelling of the face, lips, or tongue -breast lumps -changes in emotions or moods -depressed mood -heavy or prolonged menstrual bleeding -pain, irritation, swelling, or bruising at the insertion site -scar at site of insertion -signs of infection at the insertion site such as fever, and skin redness, pain or discharge -signs of pregnancy -signs and symptoms of a blood clot such as breathing problems; changes in vision; chest pain; severe, sudden headache; pain, swelling, warmth in the leg; trouble speaking; sudden numbness or weakness of the face, arm or leg -signs and symptoms of liver injury like dark yellow or brown urine; general ill feeling or flu-like symptoms;  light-colored   stools; loss of appetite; nausea; right upper belly pain; unusually weak or tired; yellowing of the eyes or skin -unusual vaginal bleeding, discharge -signs and symptoms of a stroke like changes in vision; confusion; trouble speaking or understanding; severe headaches; sudden numbness or weakness of the face, arm or leg; trouble walking; dizziness; loss of balance or coordination Side effects that usually do not require medical attention (report to your doctor or health care professional if they continue or are bothersome): -acne -back pain -breast pain -changes in weight -dizziness -general ill feeling or flu-like symptoms -headache -irregular menstrual bleeding -nausea -sore throat -vaginal irritation or inflammation This list may not describe all possible side effects. Call your doctor for medical advice about side effects. You may report side effects to FDA at 1-800-FDA-1088. Where should I keep my medicine? This drug is given in a hospital or clinic and will not be stored at home. NOTE: This sheet is a summary. It may not cover all possible information. If you have questions about this medicine, talk to your doctor, pharmacist, or health care provider.  2018 Elsevier/Gold Standard (2015-07-17 11:19:22)  

## 2016-11-02 NOTE — BH Specialist Note (Deleted)
Integrated Behavioral Health Follow Up Visit  MRN: [id number] Name: Margaret Keller  Number of Integrated Behavioral Health Clinician visits: {IBH Number of Visits:21014052} Session Start time: ***  Session End time: *** Total time: {IBH Total Time:21014050}  Type of Service: Integrated Behavioral Health- Individual/Family Interpretor:{yes NW:[id number]}no:[id number]} Interpretor Name and Language: ***  SUBJECTIVE: Margaret Keller is a 21 y.o. female accompanied by {Patient accompanied by:[phone]} Patient was referred by *** for ***. Patient reports the following symptoms/concerns: *** Duration of problem: ***; Severity of problem: {Mild/Moderate/Severe:20260}  OBJECTIVE: Mood: {BHH MOOD:22306} and Affect: {BHH AFFECT:22307} Risk of harm to self or others: {CHL AMB BH Suicide Current Mental Status:21022748}  LIFE CONTEXT: Family and Social: *** School/Work: *** Self-Care: *** Life Changes: ***  GOALS ADDRESSED: Patient will: 1.  Reduce symptoms of: {IBH Symptoms:21014056}  2.  Increase knowledge and/or ability of: {IBH Patient Tools:21014057}  3.  Demonstrate ability to: {IBH Goals:21014053}  INTERVENTIONS: Interventions utilized:  {IBH Interventions:21014054} Standardized Assessments completed: {IBH Screening Tools:21014051}  ASSESSMENT: Patient currently experiencing ***.   Patient may benefit from ***.  PLAN: 1. Follow up with behavioral health clinician on : *** 2. Behavioral recommendations: *** 3. Referral(s): {IBH Referrals:21014055} 4. "From scale of 1-10, how likely are you to follow plan?": ***  Rae LipsJamie C Garmon Dehn, LCSWA  Depression screen [facility] 2/9 11/02/2016 07/13/2016 06/09/2016 01/30/2015 01/02/2015  Decreased Interest 1 1 1  0 0  Down, Depressed, Hopeless 1 2 1  0 0  PHQ - 2 Score 2 3 2  0 0  Altered sleeping 1 - 3 - -  Tired, decreased energy 1 2 2  - -  Change in appetite 1 1 3  - -  Feeling bad or failure about yourself  1 2 0 - -  Trouble concentrating 1 1 1  - -   Moving slowly or fidgety/restless 1 0 0 - -  Suicidal thoughts 0 0 0 - -  PHQ-9 Score 8 - 11 - -   GAD 7 : Generalized Anxiety Score 11/02/2016 07/13/2016 06/09/2016  Nervous, Anxious, on Edge 1 2 2   Control/stop worrying 1 3 3   Worry too much - different things 1 3 3   Trouble relaxing 1 2 2   Restless 1 2 1   Easily annoyed or irritable 1 2 3   Afraid - awful might happen 1 3 2   Total GAD 7 Score 7 17 16

## 2016-11-02 NOTE — Progress Notes (Signed)
Post Partum Exam  Margaret Keller is a 21 y.o. [phone] female who presents for a postpartum visit. She is 6 weeks postpartum following a spontaneous vaginal delivery. I have fully reviewed the prenatal and intrapartum course. The delivery was at 36 gestational weeks due to preeclampsia  With severe features. Postpartum course has been stress at home and feeling sad. Denies SI or thoughts of harming baby.. Baby's course has been normal. Baby is feeding by bottle. Bleeding no bleeding. Bowel function is normal. Bladder function is normal. Patient is sexually active. Contraception method is condoms.    Review of Systems Pertinent items noted in HPI and remainder of comprehensive ROS otherwise negative.    Objective:  Blood pressure (!) 127/58, pulse 84, weight 148 lb 1.6 oz (67.2 kg), not currently breastfeeding.  General:  alert, cooperative and no distress  HEENT Atraumatic, normocephalic  Lungs: clear to auscultation bilaterally  Heart:  regular rate and rhythm, S1, S2 normal, no murmur, click, rub or gallop  Abdomen: soft, non-tender; bowel sounds normal; no masses,  no organomegaly  Psych No signs of depression or anxiety  Neuro  CN 2-12 intact,   MSK Normal ROM, no tenderness to palpation                  GYNECOLOGY CLINIC PROCEDURE NOTE  Margaret Keller is a 21 y.o. [id number] here for Nexplanon insertion.   Nexplanon Insertion Procedure Patient identified, informed consent performed, consent signed.   Patient does understand that irregular bleeding is a very common side effect of this medication. She was advised to have backup contraception for one week after placement. Pregnancy test in clinic today was negative.  Appropriate time out taken.  Patient's left arm was prepped and draped in the usual sterile fashion.. The ruler used to measure and mark insertion area.  Patient was prepped with alcohol swab and then injected with 3 ml of 1% lidocaine.  She was prepped with betadine,  Nexplanon removed from packaging,  Device confirmed in needle, then inserted full length of needle and withdrawn per handbook instructions. Nexplanon was able to palpated in the patient's arm; patient palpated the insert herself. There was minimal blood loss.  Patient insertion site covered with guaze and a pressure bandage to reduce any bruising.  The patient tolerated the procedure well and was given post procedure instructions.     Assessment:    6 week postpartum exam. Pap smear not done at today's visit. Last pap smear wnl 11/07/2014.  Plan:   1. Contraception: Nexplanon 2. Depression- start zoloft daily. Pt declines BH consult. Follow up 6 weeks 3. Follow up in: 6 weeks or as needed.   Rolm BookbinderAmber Simmie Garin, DO

## 2016-11-03 LAB — POCT PREGNANCY, URINE: Preg Test, Ur: NEGATIVE

## 2017-07-11 DIAGNOSIS — Z5181 Encounter for therapeutic drug level monitoring: Secondary | ICD-10-CM | POA: Diagnosis not present

## 2017-07-13 DIAGNOSIS — Z5181 Encounter for therapeutic drug level monitoring: Secondary | ICD-10-CM | POA: Diagnosis not present

## 2017-07-19 DIAGNOSIS — Z5181 Encounter for therapeutic drug level monitoring: Secondary | ICD-10-CM | POA: Diagnosis not present

## 2017-07-21 DIAGNOSIS — Z5181 Encounter for therapeutic drug level monitoring: Secondary | ICD-10-CM | POA: Diagnosis not present

## 2017-07-26 DIAGNOSIS — Z5181 Encounter for therapeutic drug level monitoring: Secondary | ICD-10-CM | POA: Diagnosis not present

## 2017-07-28 DIAGNOSIS — Z5181 Encounter for therapeutic drug level monitoring: Secondary | ICD-10-CM | POA: Diagnosis not present

## 2017-08-01 DIAGNOSIS — Z5181 Encounter for therapeutic drug level monitoring: Secondary | ICD-10-CM | POA: Diagnosis not present

## 2017-08-03 DIAGNOSIS — Z5181 Encounter for therapeutic drug level monitoring: Secondary | ICD-10-CM | POA: Diagnosis not present

## 2017-08-07 DIAGNOSIS — Z5181 Encounter for therapeutic drug level monitoring: Secondary | ICD-10-CM | POA: Diagnosis not present

## 2017-08-09 DIAGNOSIS — Z5181 Encounter for therapeutic drug level monitoring: Secondary | ICD-10-CM | POA: Diagnosis not present

## 2017-08-15 DIAGNOSIS — Z5181 Encounter for therapeutic drug level monitoring: Secondary | ICD-10-CM | POA: Diagnosis not present

## 2017-08-17 DIAGNOSIS — Z5181 Encounter for therapeutic drug level monitoring: Secondary | ICD-10-CM | POA: Diagnosis not present

## 2017-08-22 DIAGNOSIS — Z5181 Encounter for therapeutic drug level monitoring: Secondary | ICD-10-CM | POA: Diagnosis not present

## 2017-08-24 DIAGNOSIS — Z5181 Encounter for therapeutic drug level monitoring: Secondary | ICD-10-CM | POA: Diagnosis not present

## 2017-08-29 DIAGNOSIS — Z5181 Encounter for therapeutic drug level monitoring: Secondary | ICD-10-CM | POA: Diagnosis not present

## 2017-08-31 DIAGNOSIS — Z5181 Encounter for therapeutic drug level monitoring: Secondary | ICD-10-CM | POA: Diagnosis not present

## 2017-09-05 DIAGNOSIS — Z5181 Encounter for therapeutic drug level monitoring: Secondary | ICD-10-CM | POA: Diagnosis not present

## 2017-09-07 DIAGNOSIS — Z5181 Encounter for therapeutic drug level monitoring: Secondary | ICD-10-CM | POA: Diagnosis not present

## 2017-09-13 DIAGNOSIS — Z5181 Encounter for therapeutic drug level monitoring: Secondary | ICD-10-CM | POA: Diagnosis not present

## 2017-09-15 DIAGNOSIS — Z5181 Encounter for therapeutic drug level monitoring: Secondary | ICD-10-CM | POA: Diagnosis not present

## 2017-09-19 DIAGNOSIS — Z5181 Encounter for therapeutic drug level monitoring: Secondary | ICD-10-CM | POA: Diagnosis not present

## 2017-09-21 DIAGNOSIS — Z5181 Encounter for therapeutic drug level monitoring: Secondary | ICD-10-CM | POA: Diagnosis not present

## 2017-09-26 DIAGNOSIS — Z5181 Encounter for therapeutic drug level monitoring: Secondary | ICD-10-CM | POA: Diagnosis not present

## 2017-09-28 DIAGNOSIS — Z5181 Encounter for therapeutic drug level monitoring: Secondary | ICD-10-CM | POA: Diagnosis not present

## 2017-09-28 IMAGING — US US MFM OB DETAIL+14 WK
1 series · 14 of 28 positions shown · non-contrast
Comparison: none

[Series 1: us mfm ob detail+14 wk · 137 acquisitions, 14 frames shown]
[im 6/137]
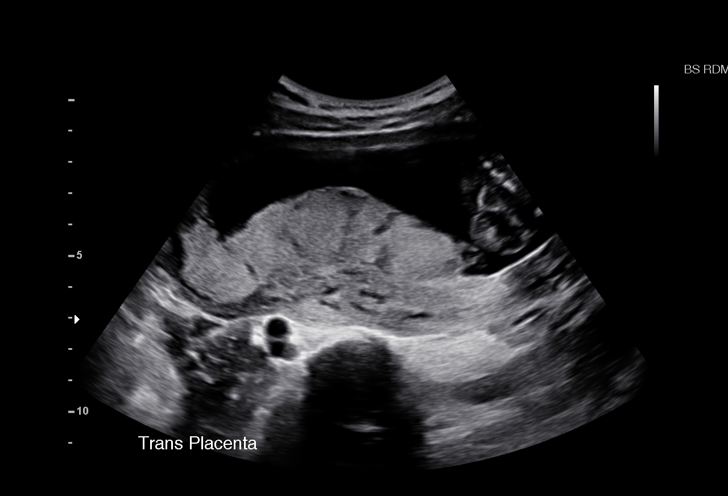
[im 16/137]
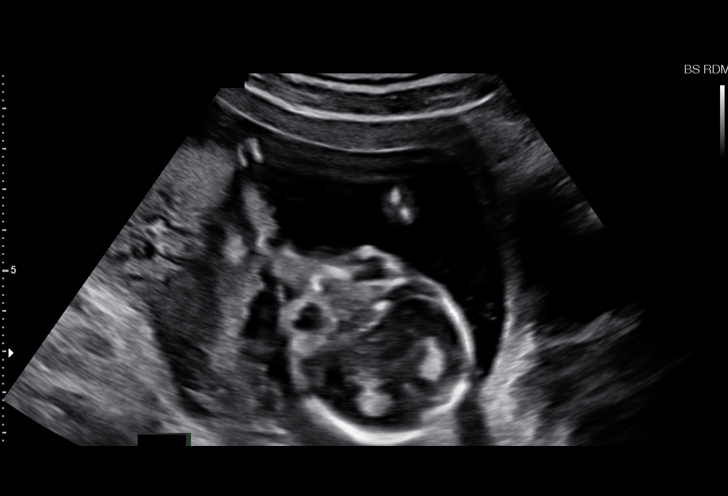
[im 26/137]
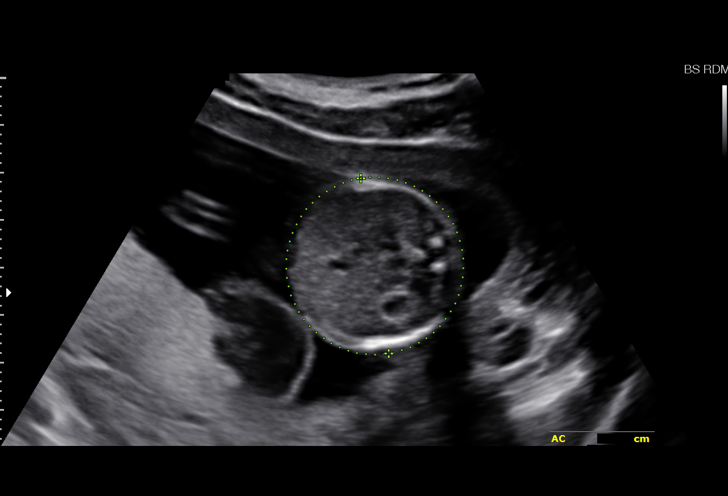
[im 36/137]
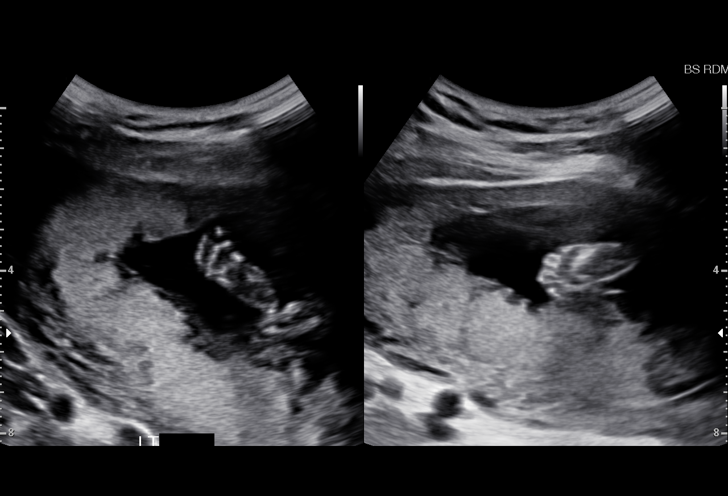
[im 46/137]
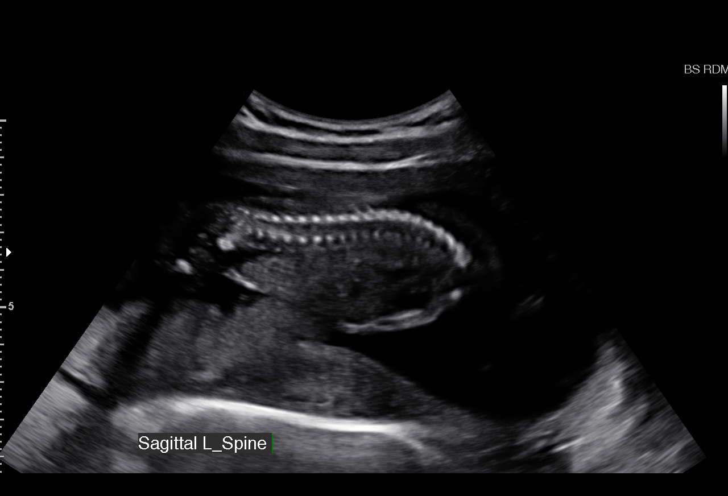
[im 56/137]
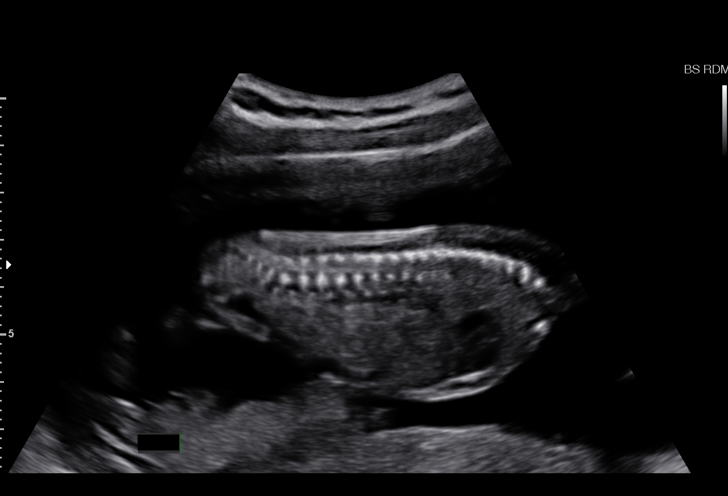
[im 66/137]
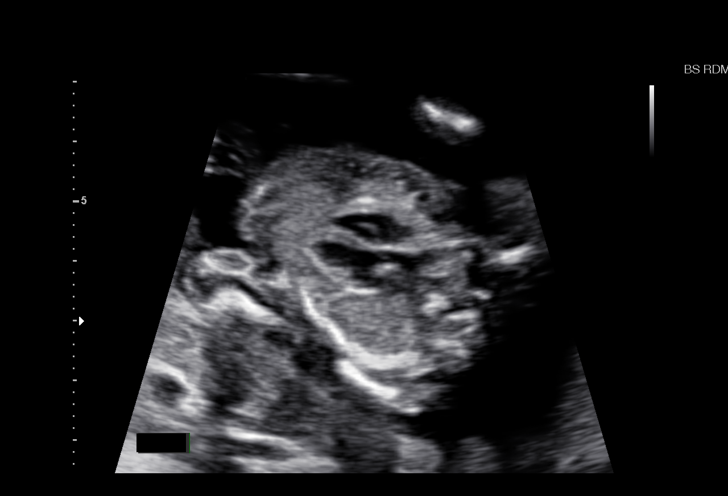
[im 76/137]
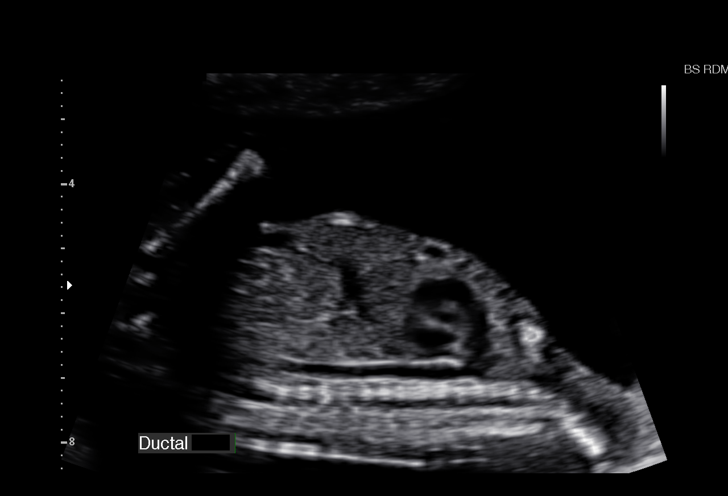
[im 86/137]
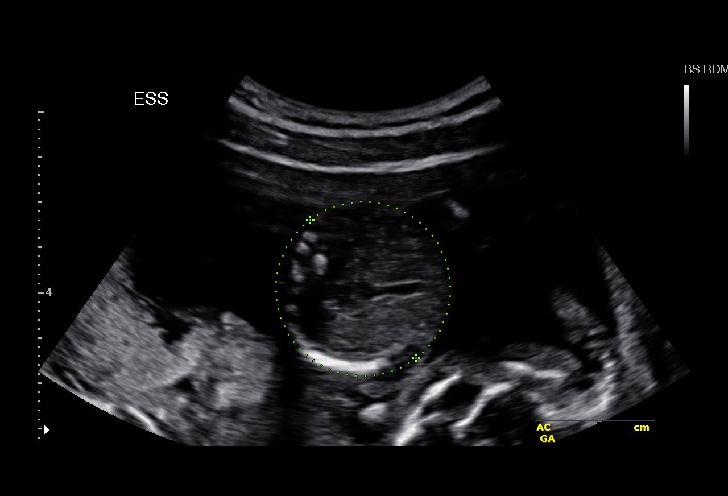
[im 96/137]
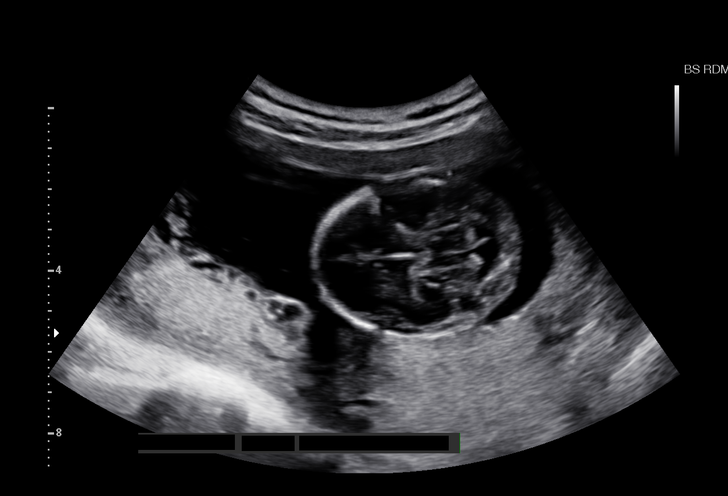
[im 106/137]
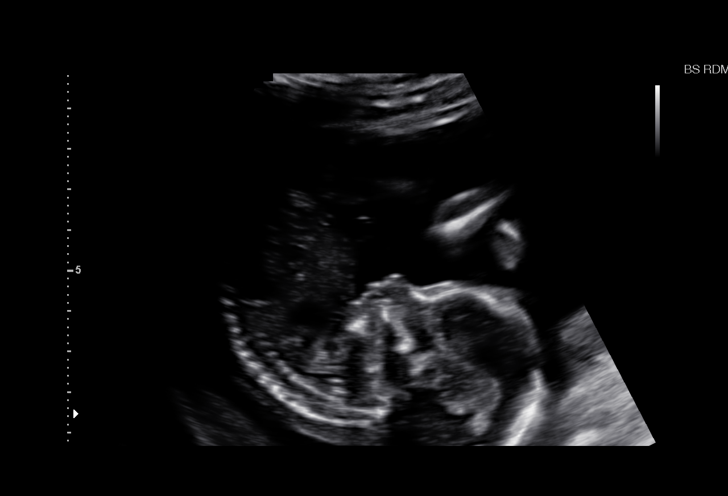
[im 116/137]
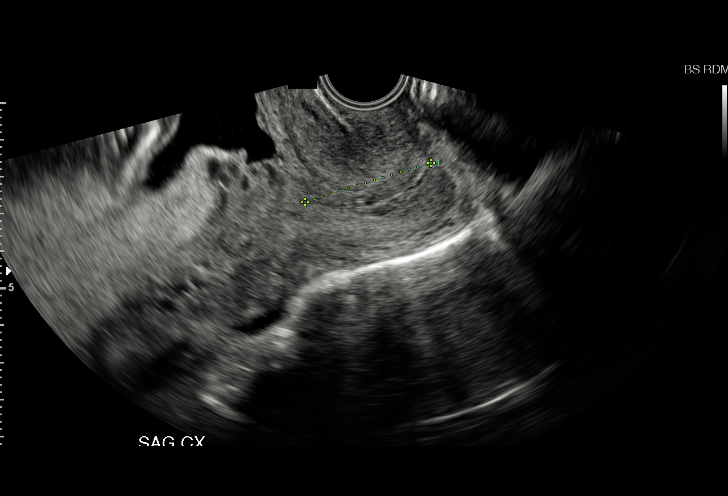
[im 126/137]
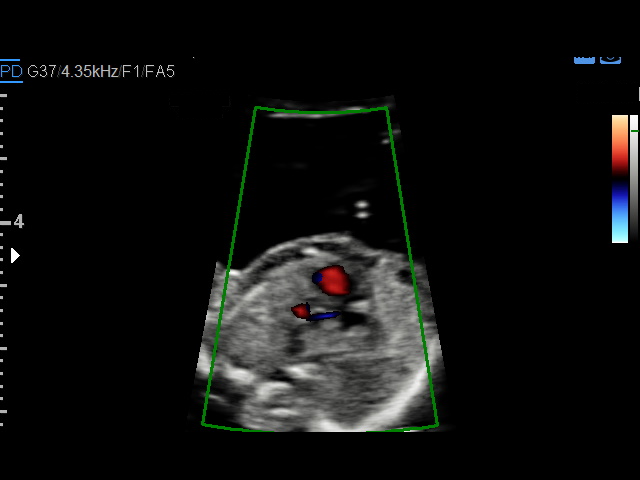
[im 137/137]
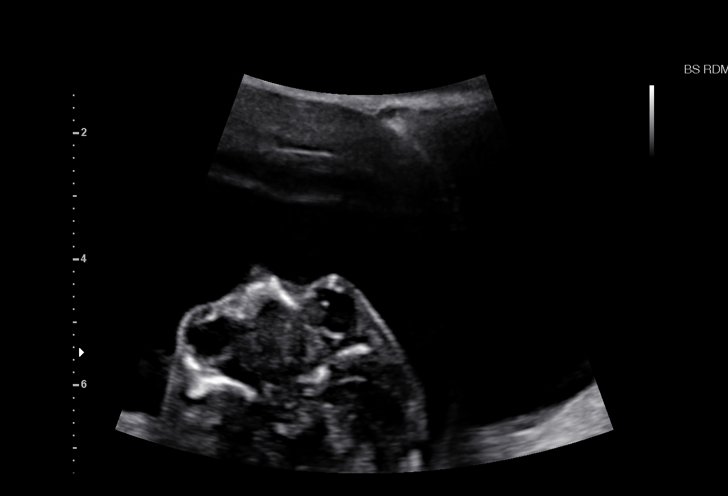

[14 of 28 positions shown; findings below may reference images not displayed]

OBSTETRICS REPORT
(Signed Final 11/13/2014 [DATE])

Name:       RENNY MAURICIO ALMACHE                      Visit  11/13/2014 [DATE]
Date:

Faculty Physician
Service(s) Provided

US MFM OB TRANSVAGINAL                                 76817.2
Indications

Detailed fetal anatomic survey                         Z36
Poor obstetric history: Previous preterm delivery
X 2 (20 and 22 weeks)
Cervical insufficiency, 2nd
Hypertension - Chronic/Pre-existing
17 weeks gestation of pregnancy
Fetal Evaluation

Num Of             1
Fetuses:
Fetal Heart        142                          bpm
Rate:
Cardiac Activity:  Observed
Presentation:      Cephalic
P. Cord            Visualized, central
Insertion:

Amniotic Fluid
AFI FV:      Subjectively within normal limits
Larg Pckt:      5.4  cm
Biometry

BPD:     39.5   m    G. Age:   18w 0d                 CI:        75.09   70 - 86
m
FL/HC:      16.1   14.6 -
17.6
HC:     144.6   m    G. Age:   17w 5d        61  %    HC/AC:      1.22   1.07 -
m
AC:     118.1   m    G. Age:   17w 4d        57  %    FL/BPD
m                                     :
FL:      23.3   m    G. Age:   17w 0d        35  %    FL/AC:      19.7   20 - 24
m
HUM:     24.6   m    G. Age:   17w 5d        67  %
m
CER:     17.5   m    G. Age:   17w 4d        54  %
m
NFT:       3.1  m
m

Est.         191   gm    0 lb 7 oz      54   %
FW:
Gestational Age

LMP:           17w 2d        Date:  07/15/14                  EDD:   04/21/15
U/S Today:     17w 4d                                         EDD:   04/19/15
Best:          17w 2d    Det. By:   LMP  (07/15/14)           EDD:   04/21/15
Anatomy

Cranium:          Appears normal         Aortic Arch:       Appears normal
Fetal Cavum:      Appears normal         Ductal Arch:       Appears normal
Ventricles:       Appears normal         Diaphragm:         Appears normal
Choroid Plexus:   Appears normal         Stomach:           Appears normal,
left sided
Cerebellum:       Appears normal         Abdomen:           Appears normal
Posterior         Appears normal         Abdominal          Appears nml (cord
Fossa:                                   Wall:              insert, abd wall)
Nuchal Fold:      Appears normal         Cord Vessels:      Appears normal (3
vessel cord)
Face:             Appears normal         Kidneys:           Appear normal
(orbits and profile)
Lips:             Appears normal         Bladder:           Appears normal
Palate:           Not well visualized    Spine:             Appears normal
Heart:            Appears normal         Lower              Appears normal
(4CH, axis); EIF       Extremities:
RVOT:             Appears normal         Upper              Visualized
Extremities:
LVOT:             Appears normal

Other:   Fetus appears to be a female. Heels and 5th digit visualized.
Technically difficult due to fetal position.
Targeted Anatomy

Fetal Central Nervous System
Lat. Ventricles:  7.0                    Cisterna
Magna:
Cervix Uterus Adnexa

Cervical Length:    3         cm

Cervix:       Measured transvaginally.

Left Ovary:    Not visualized.
Right Ovary:   Within normal limits.

Adnexa:     No abnormality visualized.
Impression

SIUP at 17+2 weeks
Normal detailed fetal anatomy
Markers of aneuploidy: EIF - not thought to increase the
risk of DS when seen in isolation and in a low risk
population
Normal amniotic fluid volume
Measurements consistent with LMP dating
TA and EV views of cervix: TA - 3.0 cms without funneling;
EV - contraction in LUS
Recommendations

Follow-up ultrasound for cervical length in one week

## 2018-03-30 DIAGNOSIS — H40033 Anatomical narrow angle, bilateral: Secondary | ICD-10-CM | POA: Diagnosis not present

## 2018-03-30 DIAGNOSIS — H16223 Keratoconjunctivitis sicca, not specified as Sjogren's, bilateral: Secondary | ICD-10-CM | POA: Diagnosis not present

## 2018-03-31 DIAGNOSIS — H5213 Myopia, bilateral: Secondary | ICD-10-CM | POA: Diagnosis not present

## 2019-06-14 ENCOUNTER — Telehealth: Payer: Medicaid Other

## 2019-06-14 IMAGING — US US MFM FETAL BPP W/O NON-STRESS
1 series · 12 of 14 positions shown · non-contrast
Comparison: none

[Series 1: us mfm fetal bpp w/o non-stress · 14 acquisitions, 12 frames shown]
[im 1/14]
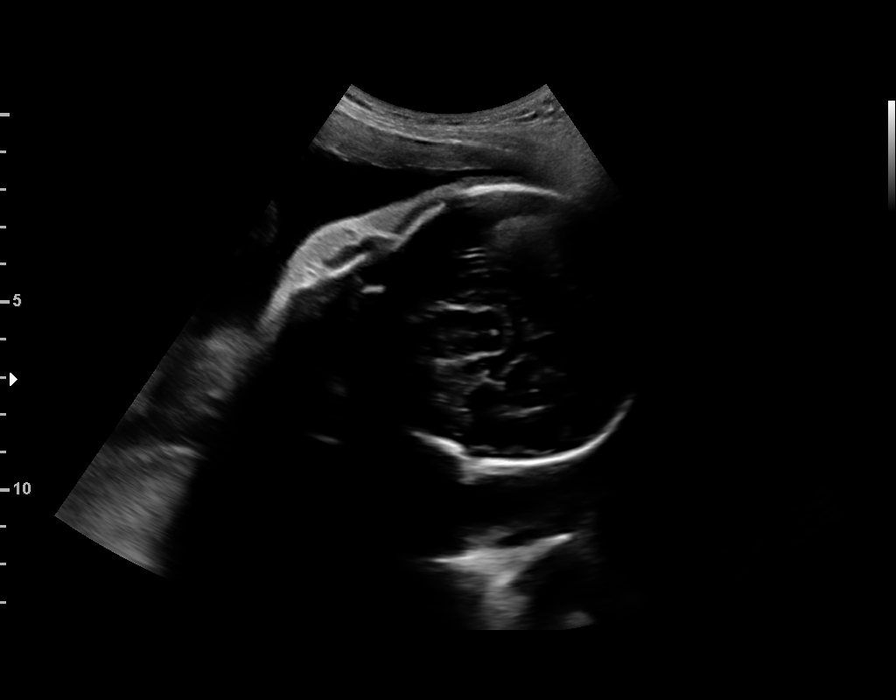
[im 2/14]
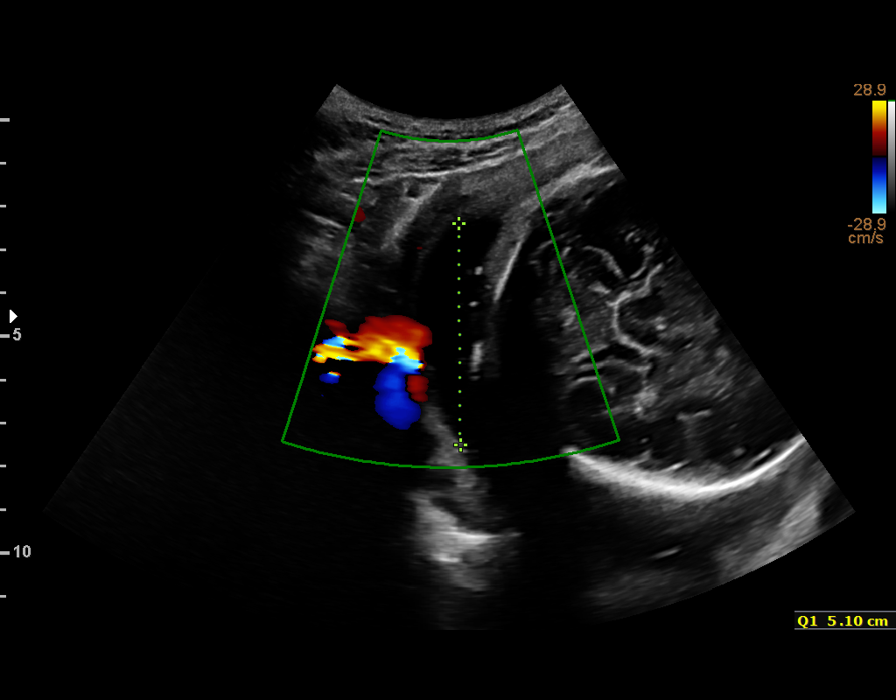
[im 3/14]
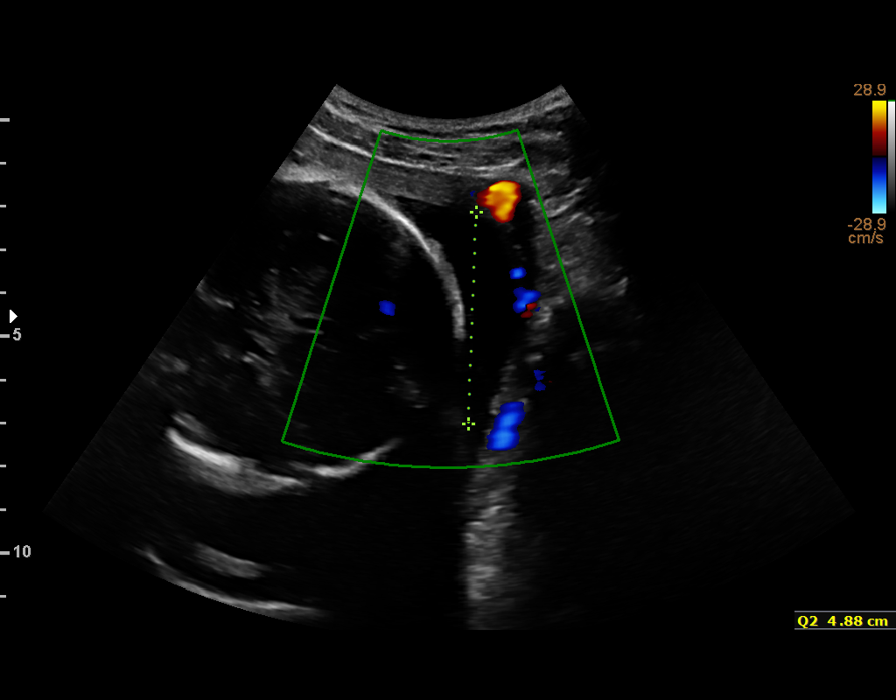
[im 5/14]
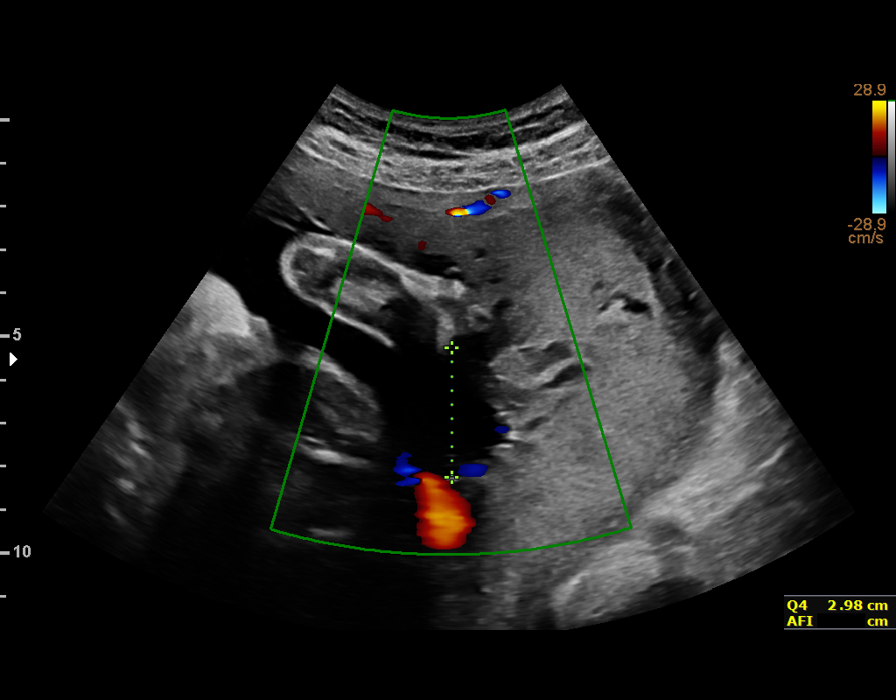
[im 6/14]
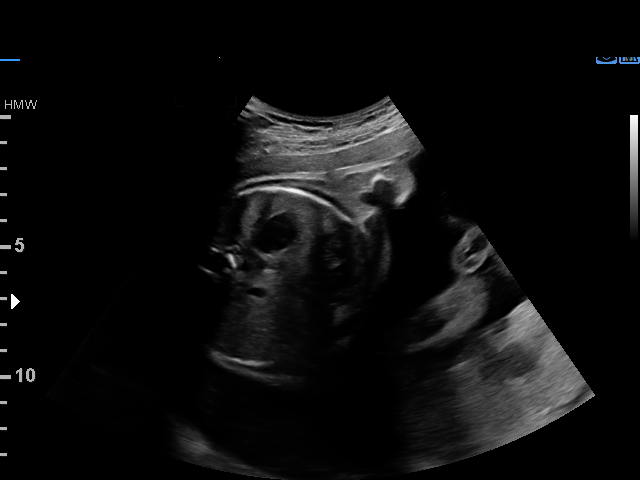
[im 7/14]
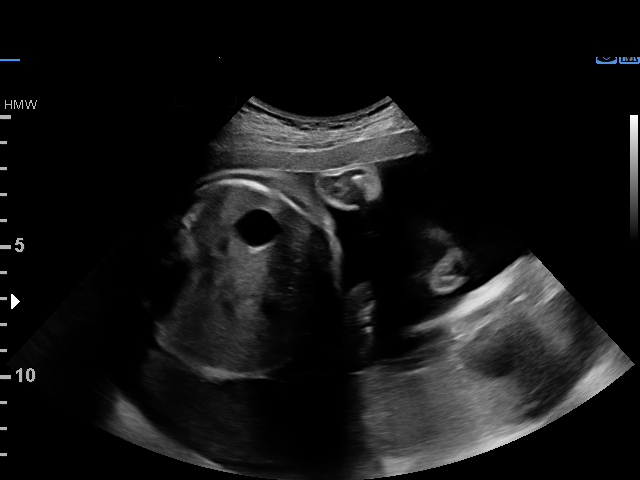
[im 8/14]
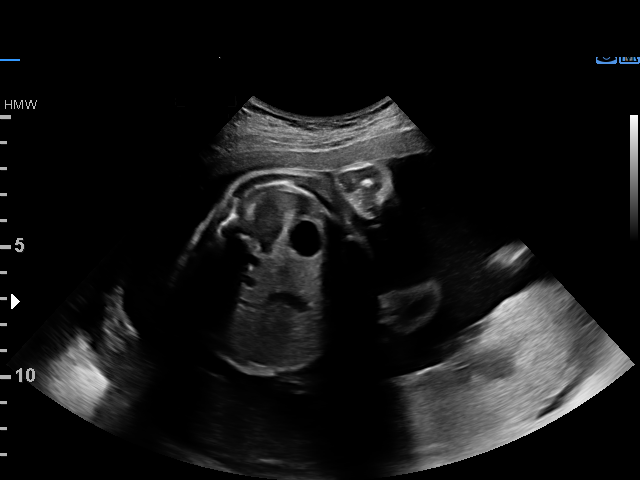
[im 9/14]
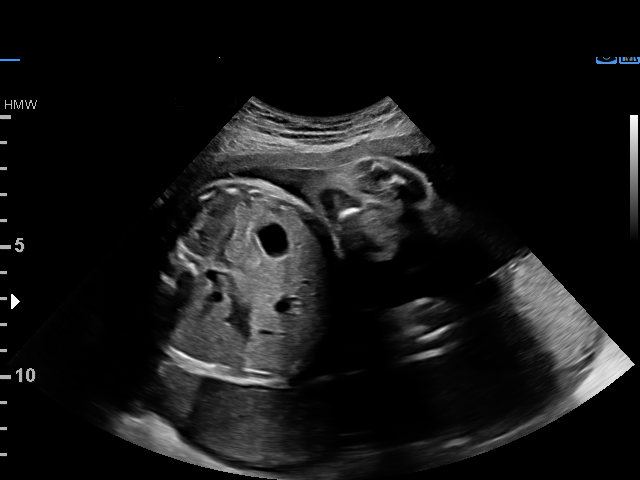
[im 10/14]
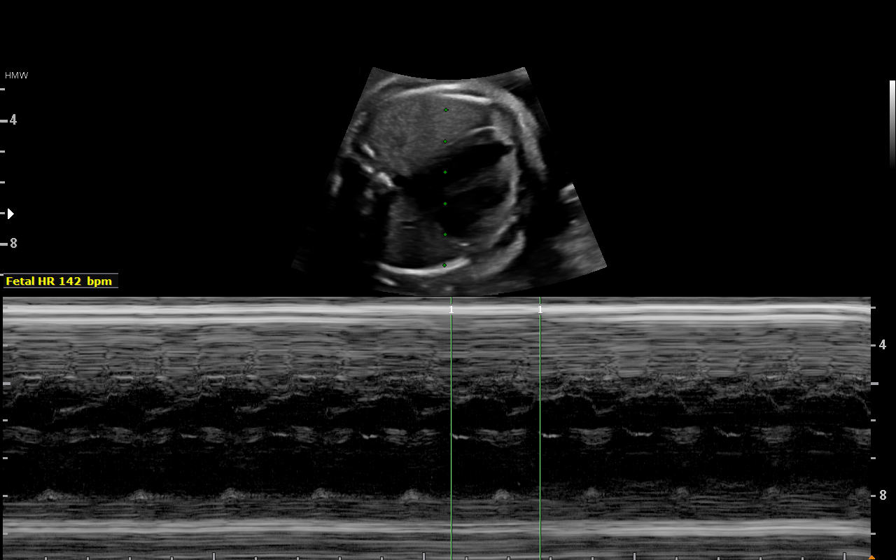
[im 12/14]
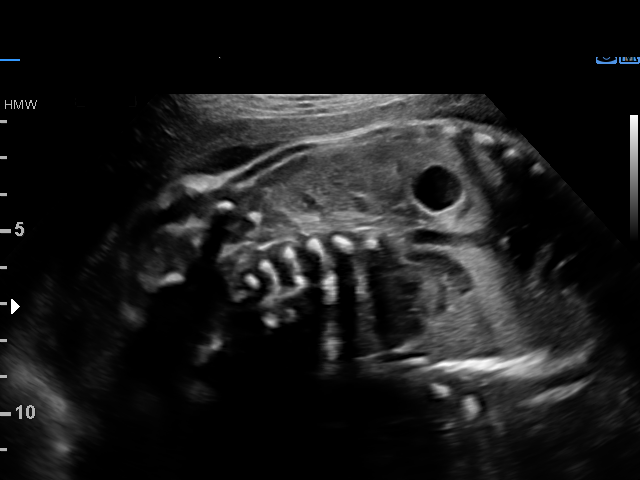
[im 13/14]
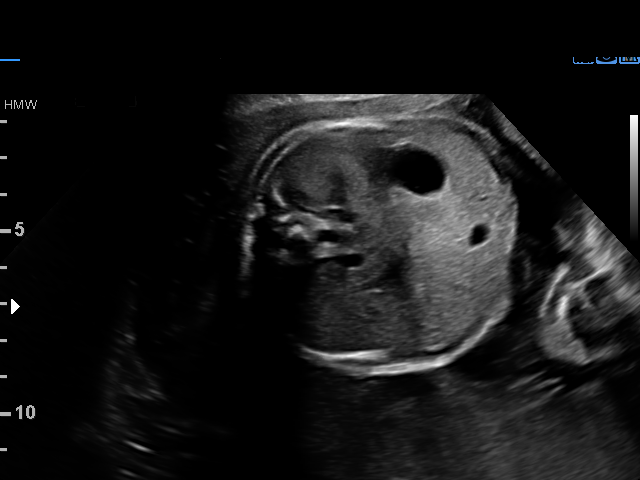
[im 14/14]
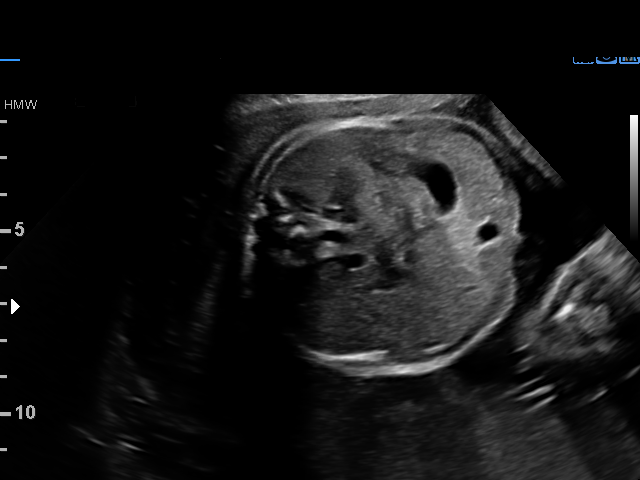

[12 of 14 positions shown; findings below may reference images not displayed]

ENEYDA CNM

1  ROGELIO MC          [id number]      [id number]     [id number]
Indications

28 weeks gestation of pregnancy
Poor obstetrical history (prev PTD x 3)
Cervical cerclage suture present, second
trimester
Poor obstetric history: Previous midtrimester
loss (cerv insuff)
Hypertension - Gestational
OB History

Blood Type:            Height:  5'5"   Weight (lb):  145      BMI:
Gravidity:    4         Prem:   3
Living:       1
Fetal Evaluation

Num Of Fetuses:     1
Fetal Heart         142
Rate(bpm):
Cardiac Activity:   Observed
Presentation:       Cephalic

Amniotic Fluid
AFI FV:      Subjectively within normal limits

AFI Sum(cm)     %Tile       Largest Pocket(cm)
16.54           61

RUQ(cm)       RLQ(cm)       LUQ(cm)        LLQ(cm)
5.1
Biophysical Evaluation

Amniotic F.V:   Within normal limits       F. Tone:        Observed
F. Movement:    Observed                   Score:          [DATE]
F. Breathing:   Observed
Gestational Age

LMP:           28w 3d       Date:   01/12/16                 EDD:   10/18/16
Best:          28w 3d    Det. By:   LMP  (01/12/16)          EDD:   10/18/16
Impression

SIUP at 28+3 weeks
Normal amniotic fluid volume
BPP [DATE]
Recommendations

Continue weekly BPPs

## 2019-09-12 ENCOUNTER — Ambulatory Visit (HOSPITAL_COMMUNITY)
Admission: EM | Admit: 2019-09-12 | Discharge: 2019-09-12 | Disposition: A | Discharge status: Home / Self Care | Payer: Medicaid Other | Attending: Family Medicine | Admitting: Family Medicine

## 2019-09-12 ENCOUNTER — Other Ambulatory Visit: Payer: Self-pay

## 2019-09-12 ENCOUNTER — Encounter (HOSPITAL_COMMUNITY): Payer: Self-pay

## 2019-09-12 DIAGNOSIS — Z20822 Contact with and (suspected) exposure to covid-19: Secondary | ICD-10-CM | POA: Diagnosis not present

## 2019-09-12 NOTE — ED Triage Notes (Signed)
Pt is wanting COVID testing after her husband tested POSITIVE Sunday, pt is denying ALL sxs today.

## 2019-09-13 LAB — SARS CORONAVIRUS 2 (TAT 6-24 HRS): SARS Coronavirus 2: NEGATIVE

## 2020-01-31 ENCOUNTER — Telehealth: Payer: Self-pay

## 2020-01-31 NOTE — Telephone Encounter (Signed)
LVM for pt to contact the office we are changing her appt to a virtual appt due to the weather unless she wishes to reschedule

## 2020-01-31 NOTE — Telephone Encounter (Signed)
LVM telling pt her appt will be switch to a virtual appt due to the weather or if she wishes to reschedule she can call to have appt rescheduled

## 2020-02-01 ENCOUNTER — Telehealth: Payer: Self-pay | Admitting: Nurse Practitioner

## 2020-08-21 ENCOUNTER — Ambulatory Visit: Payer: Medicaid Other | Admitting: Nurse Practitioner

## 2020-10-16 DIAGNOSIS — H5213 Myopia, bilateral: Secondary | ICD-10-CM | POA: Diagnosis not present

## 2021-08-21 ENCOUNTER — Telehealth: Payer: Medicaid Other | Admitting: Physician Assistant

## 2021-08-21 DIAGNOSIS — B9689 Other specified bacterial agents as the cause of diseases classified elsewhere: Secondary | ICD-10-CM | POA: Diagnosis not present

## 2021-08-21 DIAGNOSIS — N760 Acute vaginitis: Secondary | ICD-10-CM

## 2021-08-21 MED ORDER — METRONIDAZOLE 500 MG PO TABS: 500.0000 mg | ORAL_TABLET | Freq: Two times a day (BID) | ORAL | 0 refills | Status: AC

## 2021-08-21 NOTE — Progress Notes (Signed)

## 2022-05-08 DIAGNOSIS — Z3046 Encounter for surveillance of implantable subdermal contraceptive: Secondary | ICD-10-CM | POA: Diagnosis not present

## 2022-06-01 DIAGNOSIS — Z1339 Encounter for screening examination for other mental health and behavioral disorders: Secondary | ICD-10-CM | POA: Diagnosis not present

## 2022-06-01 DIAGNOSIS — Z6830 Body mass index (BMI) 30.0-30.9, adult: Secondary | ICD-10-CM | POA: Diagnosis not present

## 2022-06-01 DIAGNOSIS — Z3200 Encounter for pregnancy test, result unknown: Secondary | ICD-10-CM | POA: Diagnosis not present

## 2022-06-01 DIAGNOSIS — Z0000 Encounter for general adult medical examination without abnormal findings: Secondary | ICD-10-CM | POA: Diagnosis not present

## 2022-06-01 DIAGNOSIS — Z1331 Encounter for screening for depression: Secondary | ICD-10-CM | POA: Diagnosis not present

## 2022-06-01 DIAGNOSIS — E559 Vitamin D deficiency, unspecified: Secondary | ICD-10-CM | POA: Diagnosis not present

## 2022-06-01 DIAGNOSIS — Z1159 Encounter for screening for other viral diseases: Secondary | ICD-10-CM | POA: Diagnosis not present

## 2022-06-01 DIAGNOSIS — R5383 Other fatigue: Secondary | ICD-10-CM | POA: Diagnosis not present

## 2022-06-01 DIAGNOSIS — Z7251 High risk heterosexual behavior: Secondary | ICD-10-CM | POA: Diagnosis not present

## 2022-06-01 DIAGNOSIS — R0602 Shortness of breath: Secondary | ICD-10-CM | POA: Diagnosis not present
